# Patient Record
Sex: Male | Born: 1965
Health system: Southern US, Community
[De-identification: ages and names within clinical notes are randomized; demographics above are authoritative.]

## PROBLEM LIST (undated history)

## (undated) DIAGNOSIS — J340 Abscess, furuncle and carbuncle of nose: Secondary | ICD-10-CM

## (undated) DIAGNOSIS — E291 Testicular hypofunction: Secondary | ICD-10-CM

## (undated) DIAGNOSIS — I10 Essential (primary) hypertension: Secondary | ICD-10-CM

## (undated) DIAGNOSIS — K649 Unspecified hemorrhoids: Secondary | ICD-10-CM

## (undated) DIAGNOSIS — F329 Major depressive disorder, single episode, unspecified: Secondary | ICD-10-CM

## (undated) DIAGNOSIS — F32A Depression, unspecified: Secondary | ICD-10-CM

## (undated) DIAGNOSIS — R338 Other retention of urine: Secondary | ICD-10-CM

## (undated) DIAGNOSIS — Z85828 Personal history of other malignant neoplasm of skin: Secondary | ICD-10-CM

## (undated) DIAGNOSIS — G4700 Insomnia, unspecified: Secondary | ICD-10-CM

## (undated) DIAGNOSIS — M1990 Unspecified osteoarthritis, unspecified site: Secondary | ICD-10-CM

## (undated) HISTORY — DX: Unspecified osteoarthritis, unspecified site: M19.90

## (undated) HISTORY — PX: TOTAL HIP ARTHROPLASTY: SHX124

## (undated) HISTORY — DX: Insomnia, unspecified: G47.00

## (undated) HISTORY — DX: Depression, unspecified: F32.A

## (undated) HISTORY — DX: Testicular hypofunction: E29.1

## (undated) HISTORY — PX: COLONOSCOPY: SHX174

## (undated) HISTORY — DX: Other retention of urine: R33.8

## (undated) HISTORY — PX: TOTAL KNEE ARTHROPLASTY: SHX125

## (undated) HISTORY — PX: OSTEOTOMY: SHX137

## (undated) HISTORY — PX: JOINT REPLACEMENT: SHX530

## (undated) HISTORY — DX: Major depressive disorder, single episode, unspecified: F32.9

## (undated) HISTORY — PX: HERNIA REPAIR: SHX51

## (undated) HISTORY — DX: Essential (primary) hypertension: I10

## (undated) HISTORY — DX: Unspecified hemorrhoids: K64.9

## (undated) HISTORY — PX: APPENDECTOMY: SHX54

## (undated) HISTORY — DX: Personal history of other malignant neoplasm of skin: Z85.828

---
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
## 2000-05-21 ENCOUNTER — Ambulatory Visit (HOSPITAL_BASED_OUTPATIENT_CLINIC_OR_DEPARTMENT_OTHER): Admission: RE | Admit: 2000-05-21 | Discharge: 2000-05-21 | Payer: Self-pay | Admitting: Orthopedic Surgery

## 2007-02-16 DIAGNOSIS — L309 Dermatitis, unspecified: Secondary | ICD-10-CM | POA: Insufficient documentation

## 2008-08-10 DIAGNOSIS — G8929 Other chronic pain: Secondary | ICD-10-CM | POA: Insufficient documentation

## 2010-08-25 DIAGNOSIS — Z85828 Personal history of other malignant neoplasm of skin: Secondary | ICD-10-CM

## 2010-08-25 HISTORY — DX: Personal history of other malignant neoplasm of skin: Z85.828

## 2013-07-07 DIAGNOSIS — M169 Osteoarthritis of hip, unspecified: Secondary | ICD-10-CM | POA: Insufficient documentation

## 2014-07-25 DIAGNOSIS — R7989 Other specified abnormal findings of blood chemistry: Secondary | ICD-10-CM | POA: Insufficient documentation

## 2015-11-06 DIAGNOSIS — F0631 Mood disorder due to known physiological condition with depressive features: Secondary | ICD-10-CM | POA: Insufficient documentation

## 2015-11-06 DIAGNOSIS — M532X8 Spinal instabilities, sacral and sacrococcygeal region: Secondary | ICD-10-CM | POA: Insufficient documentation

## 2016-10-01 DIAGNOSIS — M25562 Pain in left knee: Secondary | ICD-10-CM | POA: Diagnosis not present

## 2016-10-09 DIAGNOSIS — Z96652 Presence of left artificial knee joint: Secondary | ICD-10-CM | POA: Diagnosis not present

## 2016-10-09 DIAGNOSIS — T8484XA Pain due to internal orthopedic prosthetic devices, implants and grafts, initial encounter: Secondary | ICD-10-CM | POA: Diagnosis not present

## 2016-10-22 DIAGNOSIS — S46812D Strain of other muscles, fascia and tendons at shoulder and upper arm level, left arm, subsequent encounter: Secondary | ICD-10-CM | POA: Diagnosis not present

## 2016-10-22 DIAGNOSIS — M19019 Primary osteoarthritis, unspecified shoulder: Secondary | ICD-10-CM | POA: Diagnosis not present

## 2016-10-28 DIAGNOSIS — Z0000 Encounter for general adult medical examination without abnormal findings: Secondary | ICD-10-CM | POA: Diagnosis not present

## 2016-10-31 DIAGNOSIS — M461 Sacroiliitis, not elsewhere classified: Secondary | ICD-10-CM | POA: Diagnosis not present

## 2016-11-10 DIAGNOSIS — M19019 Primary osteoarthritis, unspecified shoulder: Secondary | ICD-10-CM | POA: Diagnosis not present

## 2016-11-10 DIAGNOSIS — S46812D Strain of other muscles, fascia and tendons at shoulder and upper arm level, left arm, subsequent encounter: Secondary | ICD-10-CM | POA: Diagnosis not present

## 2016-11-28 DIAGNOSIS — L989 Disorder of the skin and subcutaneous tissue, unspecified: Secondary | ICD-10-CM | POA: Diagnosis not present

## 2016-11-28 DIAGNOSIS — G5762 Lesion of plantar nerve, left lower limb: Secondary | ICD-10-CM | POA: Diagnosis not present

## 2016-11-28 DIAGNOSIS — B078 Other viral warts: Secondary | ICD-10-CM | POA: Diagnosis not present

## 2016-11-28 DIAGNOSIS — M545 Low back pain: Secondary | ICD-10-CM | POA: Diagnosis not present

## 2016-11-28 DIAGNOSIS — B079 Viral wart, unspecified: Secondary | ICD-10-CM | POA: Diagnosis not present

## 2016-11-28 DIAGNOSIS — G8929 Other chronic pain: Secondary | ICD-10-CM | POA: Diagnosis not present

## 2016-12-02 DIAGNOSIS — Z79899 Other long term (current) drug therapy: Secondary | ICD-10-CM | POA: Diagnosis not present

## 2016-12-02 DIAGNOSIS — M461 Sacroiliitis, not elsewhere classified: Secondary | ICD-10-CM | POA: Diagnosis not present

## 2016-12-02 DIAGNOSIS — F0631 Mood disorder due to known physiological condition with depressive features: Secondary | ICD-10-CM | POA: Diagnosis not present

## 2016-12-02 DIAGNOSIS — M79672 Pain in left foot: Secondary | ICD-10-CM | POA: Diagnosis not present

## 2016-12-02 DIAGNOSIS — L910 Hypertrophic scar: Secondary | ICD-10-CM | POA: Diagnosis not present

## 2016-12-03 DIAGNOSIS — Z885 Allergy status to narcotic agent status: Secondary | ICD-10-CM | POA: Diagnosis not present

## 2016-12-03 DIAGNOSIS — M542 Cervicalgia: Secondary | ICD-10-CM | POA: Diagnosis not present

## 2016-12-03 DIAGNOSIS — Z91041 Radiographic dye allergy status: Secondary | ICD-10-CM | POA: Diagnosis not present

## 2016-12-03 DIAGNOSIS — M25551 Pain in right hip: Secondary | ICD-10-CM | POA: Diagnosis not present

## 2016-12-03 DIAGNOSIS — Z85828 Personal history of other malignant neoplasm of skin: Secondary | ICD-10-CM | POA: Diagnosis not present

## 2016-12-03 DIAGNOSIS — M545 Low back pain: Secondary | ICD-10-CM | POA: Diagnosis not present

## 2016-12-03 DIAGNOSIS — M25512 Pain in left shoulder: Secondary | ICD-10-CM | POA: Diagnosis not present

## 2016-12-03 DIAGNOSIS — Z96641 Presence of right artificial hip joint: Secondary | ICD-10-CM | POA: Diagnosis not present

## 2016-12-03 DIAGNOSIS — M1389 Other specified arthritis, multiple sites: Secondary | ICD-10-CM | POA: Diagnosis not present

## 2016-12-03 DIAGNOSIS — Z79899 Other long term (current) drug therapy: Secondary | ICD-10-CM | POA: Diagnosis not present

## 2016-12-03 DIAGNOSIS — M549 Dorsalgia, unspecified: Secondary | ICD-10-CM | POA: Diagnosis not present

## 2016-12-08 DIAGNOSIS — Z96641 Presence of right artificial hip joint: Secondary | ICD-10-CM | POA: Diagnosis not present

## 2016-12-08 DIAGNOSIS — M25551 Pain in right hip: Secondary | ICD-10-CM | POA: Diagnosis not present

## 2016-12-08 DIAGNOSIS — R0789 Other chest pain: Secondary | ICD-10-CM | POA: Diagnosis not present

## 2016-12-08 DIAGNOSIS — M25552 Pain in left hip: Secondary | ICD-10-CM | POA: Diagnosis not present

## 2016-12-08 DIAGNOSIS — Z885 Allergy status to narcotic agent status: Secondary | ICD-10-CM | POA: Diagnosis not present

## 2016-12-08 DIAGNOSIS — S299XXA Unspecified injury of thorax, initial encounter: Secondary | ICD-10-CM | POA: Diagnosis not present

## 2016-12-08 DIAGNOSIS — Z9889 Other specified postprocedural states: Secondary | ICD-10-CM | POA: Diagnosis not present

## 2016-12-08 DIAGNOSIS — Z471 Aftercare following joint replacement surgery: Secondary | ICD-10-CM | POA: Diagnosis not present

## 2016-12-08 DIAGNOSIS — Z96652 Presence of left artificial knee joint: Secondary | ICD-10-CM | POA: Diagnosis not present

## 2016-12-11 DIAGNOSIS — M79672 Pain in left foot: Secondary | ICD-10-CM | POA: Diagnosis not present

## 2016-12-18 DIAGNOSIS — Z96641 Presence of right artificial hip joint: Secondary | ICD-10-CM | POA: Diagnosis not present

## 2016-12-18 DIAGNOSIS — M25551 Pain in right hip: Secondary | ICD-10-CM | POA: Diagnosis not present

## 2016-12-18 DIAGNOSIS — Z96652 Presence of left artificial knee joint: Secondary | ICD-10-CM | POA: Diagnosis not present

## 2016-12-31 DIAGNOSIS — Z96649 Presence of unspecified artificial hip joint: Secondary | ICD-10-CM | POA: Insufficient documentation

## 2016-12-31 DIAGNOSIS — T84038A Mechanical loosening of other internal prosthetic joint, initial encounter: Secondary | ICD-10-CM | POA: Insufficient documentation

## 2017-01-12 DIAGNOSIS — M461 Sacroiliitis, not elsewhere classified: Secondary | ICD-10-CM | POA: Diagnosis not present

## 2017-01-12 DIAGNOSIS — Z885 Allergy status to narcotic agent status: Secondary | ICD-10-CM | POA: Diagnosis not present

## 2017-01-12 DIAGNOSIS — Z471 Aftercare following joint replacement surgery: Secondary | ICD-10-CM | POA: Diagnosis not present

## 2017-01-12 DIAGNOSIS — Z4789 Encounter for other orthopedic aftercare: Secondary | ICD-10-CM | POA: Diagnosis not present

## 2017-01-12 DIAGNOSIS — Z96641 Presence of right artificial hip joint: Secondary | ICD-10-CM | POA: Diagnosis not present

## 2017-01-14 DIAGNOSIS — Z471 Aftercare following joint replacement surgery: Secondary | ICD-10-CM | POA: Diagnosis not present

## 2017-01-14 DIAGNOSIS — T84038D Mechanical loosening of other internal prosthetic joint, subsequent encounter: Secondary | ICD-10-CM | POA: Diagnosis not present

## 2017-01-14 DIAGNOSIS — T84030D Mechanical loosening of internal right hip prosthetic joint, subsequent encounter: Secondary | ICD-10-CM | POA: Diagnosis not present

## 2017-01-14 DIAGNOSIS — Z96641 Presence of right artificial hip joint: Secondary | ICD-10-CM | POA: Diagnosis not present

## 2017-01-14 DIAGNOSIS — Z96649 Presence of unspecified artificial hip joint: Secondary | ICD-10-CM | POA: Diagnosis not present

## 2017-01-20 DIAGNOSIS — S46812A Strain of other muscles, fascia and tendons at shoulder and upper arm level, left arm, initial encounter: Secondary | ICD-10-CM | POA: Diagnosis not present

## 2017-01-20 DIAGNOSIS — M7552 Bursitis of left shoulder: Secondary | ICD-10-CM | POA: Diagnosis not present

## 2017-01-22 DIAGNOSIS — E291 Testicular hypofunction: Secondary | ICD-10-CM | POA: Diagnosis not present

## 2017-01-22 DIAGNOSIS — Z885 Allergy status to narcotic agent status: Secondary | ICD-10-CM | POA: Diagnosis not present

## 2017-01-22 DIAGNOSIS — M461 Sacroiliitis, not elsewhere classified: Secondary | ICD-10-CM | POA: Diagnosis not present

## 2017-01-22 DIAGNOSIS — T84038D Mechanical loosening of other internal prosthetic joint, subsequent encounter: Secondary | ICD-10-CM | POA: Diagnosis not present

## 2017-01-22 DIAGNOSIS — Z96649 Presence of unspecified artificial hip joint: Secondary | ICD-10-CM | POA: Diagnosis not present

## 2017-01-22 DIAGNOSIS — G4700 Insomnia, unspecified: Secondary | ICD-10-CM | POA: Diagnosis not present

## 2017-01-22 DIAGNOSIS — Z79899 Other long term (current) drug therapy: Secondary | ICD-10-CM | POA: Diagnosis not present

## 2017-01-22 DIAGNOSIS — Z91041 Radiographic dye allergy status: Secondary | ICD-10-CM | POA: Diagnosis not present

## 2017-01-22 DIAGNOSIS — Z419 Encounter for procedure for purposes other than remedying health state, unspecified: Secondary | ICD-10-CM | POA: Diagnosis not present

## 2017-01-22 DIAGNOSIS — F5104 Psychophysiologic insomnia: Secondary | ICD-10-CM | POA: Insufficient documentation

## 2017-01-22 DIAGNOSIS — Z01818 Encounter for other preprocedural examination: Secondary | ICD-10-CM | POA: Diagnosis not present

## 2017-01-26 DIAGNOSIS — M549 Dorsalgia, unspecified: Secondary | ICD-10-CM | POA: Diagnosis not present

## 2017-01-26 DIAGNOSIS — G8918 Other acute postprocedural pain: Secondary | ICD-10-CM | POA: Diagnosis not present

## 2017-01-26 DIAGNOSIS — Z91041 Radiographic dye allergy status: Secondary | ICD-10-CM | POA: Diagnosis not present

## 2017-01-26 DIAGNOSIS — Z8781 Personal history of (healed) traumatic fracture: Secondary | ICD-10-CM | POA: Diagnosis not present

## 2017-01-26 DIAGNOSIS — G4700 Insomnia, unspecified: Secondary | ICD-10-CM | POA: Diagnosis not present

## 2017-01-26 DIAGNOSIS — G8929 Other chronic pain: Secondary | ICD-10-CM | POA: Diagnosis not present

## 2017-01-26 DIAGNOSIS — T84030A Mechanical loosening of internal right hip prosthetic joint, initial encounter: Secondary | ICD-10-CM | POA: Diagnosis not present

## 2017-01-26 DIAGNOSIS — Z885 Allergy status to narcotic agent status: Secondary | ICD-10-CM | POA: Diagnosis not present

## 2017-01-26 DIAGNOSIS — T8484XA Pain due to internal orthopedic prosthetic devices, implants and grafts, initial encounter: Secondary | ICD-10-CM | POA: Diagnosis not present

## 2017-01-26 DIAGNOSIS — I959 Hypotension, unspecified: Secondary | ICD-10-CM | POA: Diagnosis not present

## 2017-01-26 DIAGNOSIS — Z471 Aftercare following joint replacement surgery: Secondary | ICD-10-CM | POA: Diagnosis not present

## 2017-01-26 DIAGNOSIS — Z79891 Long term (current) use of opiate analgesic: Secondary | ICD-10-CM | POA: Diagnosis not present

## 2017-01-26 DIAGNOSIS — Z8249 Family history of ischemic heart disease and other diseases of the circulatory system: Secondary | ICD-10-CM | POA: Diagnosis not present

## 2017-01-26 DIAGNOSIS — M1611 Unilateral primary osteoarthritis, right hip: Secondary | ICD-10-CM | POA: Diagnosis not present

## 2017-01-26 DIAGNOSIS — Z96641 Presence of right artificial hip joint: Secondary | ICD-10-CM | POA: Diagnosis not present

## 2017-01-26 DIAGNOSIS — M25551 Pain in right hip: Secondary | ICD-10-CM | POA: Diagnosis not present

## 2017-01-26 DIAGNOSIS — T84038D Mechanical loosening of other internal prosthetic joint, subsequent encounter: Secondary | ICD-10-CM | POA: Diagnosis not present

## 2017-01-26 DIAGNOSIS — T84032A Mechanical loosening of internal right knee prosthetic joint, initial encounter: Secondary | ICD-10-CM | POA: Diagnosis not present

## 2017-01-26 DIAGNOSIS — Z85828 Personal history of other malignant neoplasm of skin: Secondary | ICD-10-CM | POA: Diagnosis not present

## 2017-01-26 DIAGNOSIS — Z7982 Long term (current) use of aspirin: Secondary | ICD-10-CM | POA: Diagnosis not present

## 2017-01-26 DIAGNOSIS — Z823 Family history of stroke: Secondary | ICD-10-CM | POA: Diagnosis not present

## 2017-01-26 DIAGNOSIS — Z96652 Presence of left artificial knee joint: Secondary | ICD-10-CM | POA: Diagnosis not present

## 2017-01-26 DIAGNOSIS — T84038A Mechanical loosening of other internal prosthetic joint, initial encounter: Secondary | ICD-10-CM | POA: Diagnosis not present

## 2017-01-27 DIAGNOSIS — G8929 Other chronic pain: Secondary | ICD-10-CM | POA: Diagnosis not present

## 2017-01-27 DIAGNOSIS — Z96641 Presence of right artificial hip joint: Secondary | ICD-10-CM | POA: Diagnosis not present

## 2017-01-27 DIAGNOSIS — T84038A Mechanical loosening of other internal prosthetic joint, initial encounter: Secondary | ICD-10-CM | POA: Diagnosis not present

## 2017-01-27 DIAGNOSIS — M25551 Pain in right hip: Secondary | ICD-10-CM | POA: Diagnosis not present

## 2017-01-28 DIAGNOSIS — M25551 Pain in right hip: Secondary | ICD-10-CM | POA: Diagnosis not present

## 2017-01-28 DIAGNOSIS — Z96641 Presence of right artificial hip joint: Secondary | ICD-10-CM | POA: Diagnosis not present

## 2017-02-12 DIAGNOSIS — E349 Endocrine disorder, unspecified: Secondary | ICD-10-CM | POA: Diagnosis not present

## 2017-03-12 DIAGNOSIS — Z96641 Presence of right artificial hip joint: Secondary | ICD-10-CM | POA: Diagnosis not present

## 2017-03-12 DIAGNOSIS — Z471 Aftercare following joint replacement surgery: Secondary | ICD-10-CM | POA: Diagnosis not present

## 2017-03-18 DIAGNOSIS — F4321 Adjustment disorder with depressed mood: Secondary | ICD-10-CM | POA: Diagnosis not present

## 2017-03-18 DIAGNOSIS — R413 Other amnesia: Secondary | ICD-10-CM | POA: Diagnosis not present

## 2017-03-30 DIAGNOSIS — M7742 Metatarsalgia, left foot: Secondary | ICD-10-CM | POA: Diagnosis not present

## 2017-03-30 DIAGNOSIS — M79672 Pain in left foot: Secondary | ICD-10-CM | POA: Diagnosis not present

## 2017-03-30 DIAGNOSIS — M2170 Unequal limb length (acquired), unspecified site: Secondary | ICD-10-CM | POA: Diagnosis not present

## 2017-04-02 DIAGNOSIS — M7742 Metatarsalgia, left foot: Secondary | ICD-10-CM | POA: Insufficient documentation

## 2017-04-10 DIAGNOSIS — M461 Sacroiliitis, not elsewhere classified: Secondary | ICD-10-CM | POA: Diagnosis not present

## 2017-04-17 DIAGNOSIS — M461 Sacroiliitis, not elsewhere classified: Secondary | ICD-10-CM | POA: Diagnosis not present

## 2017-04-17 DIAGNOSIS — F0631 Mood disorder due to known physiological condition with depressive features: Secondary | ICD-10-CM | POA: Diagnosis not present

## 2017-04-20 DIAGNOSIS — F0631 Mood disorder due to known physiological condition with depressive features: Secondary | ICD-10-CM | POA: Diagnosis not present

## 2017-04-28 DIAGNOSIS — T50992A Poisoning by other drugs, medicaments and biological substances, intentional self-harm, initial encounter: Secondary | ICD-10-CM | POA: Diagnosis not present

## 2017-04-28 DIAGNOSIS — F419 Anxiety disorder, unspecified: Secondary | ICD-10-CM | POA: Diagnosis not present

## 2017-04-28 DIAGNOSIS — F329 Major depressive disorder, single episode, unspecified: Secondary | ICD-10-CM | POA: Diagnosis not present

## 2017-04-28 DIAGNOSIS — Z96652 Presence of left artificial knee joint: Secondary | ICD-10-CM | POA: Diagnosis not present

## 2017-04-28 DIAGNOSIS — F332 Major depressive disorder, recurrent severe without psychotic features: Secondary | ICD-10-CM | POA: Diagnosis not present

## 2017-04-28 DIAGNOSIS — Y999 Unspecified external cause status: Secondary | ICD-10-CM | POA: Diagnosis not present

## 2017-04-28 DIAGNOSIS — T50901A Poisoning by unspecified drugs, medicaments and biological substances, accidental (unintentional), initial encounter: Secondary | ICD-10-CM | POA: Diagnosis not present

## 2017-04-28 DIAGNOSIS — G8929 Other chronic pain: Secondary | ICD-10-CM | POA: Diagnosis not present

## 2017-04-28 DIAGNOSIS — G2581 Restless legs syndrome: Secondary | ICD-10-CM | POA: Diagnosis not present

## 2017-04-28 DIAGNOSIS — Z79891 Long term (current) use of opiate analgesic: Secondary | ICD-10-CM | POA: Diagnosis not present

## 2017-04-28 DIAGNOSIS — Z85828 Personal history of other malignant neoplasm of skin: Secondary | ICD-10-CM | POA: Diagnosis not present

## 2017-04-28 DIAGNOSIS — T403X2A Poisoning by methadone, intentional self-harm, initial encounter: Secondary | ICD-10-CM | POA: Diagnosis not present

## 2017-04-28 DIAGNOSIS — X58XXXA Exposure to other specified factors, initial encounter: Secondary | ICD-10-CM | POA: Diagnosis not present

## 2017-04-28 DIAGNOSIS — Z915 Personal history of self-harm: Secondary | ICD-10-CM | POA: Diagnosis not present

## 2017-04-28 DIAGNOSIS — F1110 Opioid abuse, uncomplicated: Secondary | ICD-10-CM | POA: Diagnosis not present

## 2017-04-28 DIAGNOSIS — Z96641 Presence of right artificial hip joint: Secondary | ICD-10-CM | POA: Diagnosis not present

## 2017-04-28 DIAGNOSIS — T1491XA Suicide attempt, initial encounter: Secondary | ICD-10-CM | POA: Diagnosis not present

## 2017-04-29 DIAGNOSIS — F1110 Opioid abuse, uncomplicated: Secondary | ICD-10-CM | POA: Diagnosis not present

## 2017-04-29 DIAGNOSIS — F329 Major depressive disorder, single episode, unspecified: Secondary | ICD-10-CM | POA: Diagnosis not present

## 2017-04-29 DIAGNOSIS — T50901A Poisoning by unspecified drugs, medicaments and biological substances, accidental (unintentional), initial encounter: Secondary | ICD-10-CM | POA: Diagnosis not present

## 2017-04-29 DIAGNOSIS — T1491XA Suicide attempt, initial encounter: Secondary | ICD-10-CM | POA: Diagnosis not present

## 2017-04-30 DIAGNOSIS — F332 Major depressive disorder, recurrent severe without psychotic features: Secondary | ICD-10-CM | POA: Diagnosis not present

## 2017-04-30 DIAGNOSIS — T403X2A Poisoning by methadone, intentional self-harm, initial encounter: Secondary | ICD-10-CM | POA: Diagnosis not present

## 2017-05-01 DIAGNOSIS — F332 Major depressive disorder, recurrent severe without psychotic features: Secondary | ICD-10-CM | POA: Diagnosis not present

## 2017-05-02 DIAGNOSIS — F332 Major depressive disorder, recurrent severe without psychotic features: Secondary | ICD-10-CM | POA: Diagnosis not present

## 2017-05-02 DIAGNOSIS — T403X2A Poisoning by methadone, intentional self-harm, initial encounter: Secondary | ICD-10-CM | POA: Diagnosis not present

## 2017-05-03 DIAGNOSIS — T403X2A Poisoning by methadone, intentional self-harm, initial encounter: Secondary | ICD-10-CM | POA: Diagnosis not present

## 2017-05-03 DIAGNOSIS — F332 Major depressive disorder, recurrent severe without psychotic features: Secondary | ICD-10-CM | POA: Diagnosis not present

## 2017-05-05 DIAGNOSIS — F1110 Opioid abuse, uncomplicated: Secondary | ICD-10-CM | POA: Diagnosis not present

## 2017-05-05 DIAGNOSIS — F332 Major depressive disorder, recurrent severe without psychotic features: Secondary | ICD-10-CM | POA: Diagnosis not present

## 2017-06-01 DIAGNOSIS — F332 Major depressive disorder, recurrent severe without psychotic features: Secondary | ICD-10-CM | POA: Diagnosis not present

## 2017-07-09 DIAGNOSIS — Z85828 Personal history of other malignant neoplasm of skin: Secondary | ICD-10-CM | POA: Diagnosis not present

## 2017-07-09 DIAGNOSIS — Z96641 Presence of right artificial hip joint: Secondary | ICD-10-CM | POA: Diagnosis not present

## 2017-07-09 DIAGNOSIS — M1611 Unilateral primary osteoarthritis, right hip: Secondary | ICD-10-CM | POA: Diagnosis not present

## 2017-07-09 DIAGNOSIS — Z471 Aftercare following joint replacement surgery: Secondary | ICD-10-CM | POA: Diagnosis not present

## 2017-07-10 DIAGNOSIS — R197 Diarrhea, unspecified: Secondary | ICD-10-CM | POA: Diagnosis not present

## 2017-07-10 DIAGNOSIS — R194 Change in bowel habit: Secondary | ICD-10-CM | POA: Diagnosis not present

## 2017-07-10 DIAGNOSIS — K640 First degree hemorrhoids: Secondary | ICD-10-CM | POA: Diagnosis not present

## 2017-07-14 DIAGNOSIS — Z23 Encounter for immunization: Secondary | ICD-10-CM | POA: Diagnosis not present

## 2017-07-14 DIAGNOSIS — F5104 Psychophysiologic insomnia: Secondary | ICD-10-CM | POA: Diagnosis not present

## 2017-07-15 DIAGNOSIS — Z1211 Encounter for screening for malignant neoplasm of colon: Secondary | ICD-10-CM | POA: Diagnosis not present

## 2017-07-15 DIAGNOSIS — R197 Diarrhea, unspecified: Secondary | ICD-10-CM | POA: Diagnosis not present

## 2017-07-15 LAB — HM COLONOSCOPY

## 2017-07-17 DIAGNOSIS — G4700 Insomnia, unspecified: Secondary | ICD-10-CM | POA: Diagnosis not present

## 2017-07-17 DIAGNOSIS — M25512 Pain in left shoulder: Secondary | ICD-10-CM | POA: Diagnosis not present

## 2017-07-17 DIAGNOSIS — M75102 Unspecified rotator cuff tear or rupture of left shoulder, not specified as traumatic: Secondary | ICD-10-CM | POA: Diagnosis not present

## 2017-07-21 DIAGNOSIS — F3341 Major depressive disorder, recurrent, in partial remission: Secondary | ICD-10-CM | POA: Insufficient documentation

## 2017-07-29 DIAGNOSIS — E349 Endocrine disorder, unspecified: Secondary | ICD-10-CM | POA: Diagnosis not present

## 2017-07-29 DIAGNOSIS — M461 Sacroiliitis, not elsewhere classified: Secondary | ICD-10-CM | POA: Diagnosis not present

## 2017-07-31 DIAGNOSIS — K640 First degree hemorrhoids: Secondary | ICD-10-CM | POA: Diagnosis not present

## 2017-08-04 DIAGNOSIS — I10 Essential (primary) hypertension: Secondary | ICD-10-CM | POA: Diagnosis not present

## 2017-08-04 DIAGNOSIS — E349 Endocrine disorder, unspecified: Secondary | ICD-10-CM | POA: Diagnosis not present

## 2017-08-12 DIAGNOSIS — Z885 Allergy status to narcotic agent status: Secondary | ICD-10-CM | POA: Diagnosis not present

## 2017-08-12 DIAGNOSIS — R0789 Other chest pain: Secondary | ICD-10-CM | POA: Diagnosis not present

## 2017-08-12 DIAGNOSIS — R079 Chest pain, unspecified: Secondary | ICD-10-CM | POA: Diagnosis not present

## 2017-08-12 DIAGNOSIS — I10 Essential (primary) hypertension: Secondary | ICD-10-CM | POA: Diagnosis not present

## 2017-08-12 DIAGNOSIS — I2510 Atherosclerotic heart disease of native coronary artery without angina pectoris: Secondary | ICD-10-CM | POA: Diagnosis not present

## 2017-08-13 DIAGNOSIS — F3341 Major depressive disorder, recurrent, in partial remission: Secondary | ICD-10-CM | POA: Diagnosis not present

## 2017-08-20 DIAGNOSIS — K640 First degree hemorrhoids: Secondary | ICD-10-CM | POA: Diagnosis not present

## 2017-08-28 DIAGNOSIS — Z136 Encounter for screening for cardiovascular disorders: Secondary | ICD-10-CM | POA: Diagnosis not present

## 2017-08-28 DIAGNOSIS — N401 Enlarged prostate with lower urinary tract symptoms: Secondary | ICD-10-CM | POA: Insufficient documentation

## 2017-08-28 DIAGNOSIS — Z0000 Encounter for general adult medical examination without abnormal findings: Secondary | ICD-10-CM | POA: Diagnosis not present

## 2017-08-28 DIAGNOSIS — R350 Frequency of micturition: Secondary | ICD-10-CM

## 2017-08-28 DIAGNOSIS — G4700 Insomnia, unspecified: Secondary | ICD-10-CM | POA: Diagnosis not present

## 2017-08-28 DIAGNOSIS — Z13220 Encounter for screening for lipoid disorders: Secondary | ICD-10-CM | POA: Diagnosis not present

## 2017-09-07 DIAGNOSIS — K640 First degree hemorrhoids: Secondary | ICD-10-CM | POA: Diagnosis not present

## 2017-09-23 DIAGNOSIS — F3341 Major depressive disorder, recurrent, in partial remission: Secondary | ICD-10-CM | POA: Diagnosis not present

## 2017-10-27 DIAGNOSIS — F332 Major depressive disorder, recurrent severe without psychotic features: Secondary | ICD-10-CM | POA: Diagnosis not present

## 2017-10-27 DIAGNOSIS — F0631 Mood disorder due to known physiological condition with depressive features: Secondary | ICD-10-CM | POA: Diagnosis not present

## 2017-10-27 DIAGNOSIS — F5104 Psychophysiologic insomnia: Secondary | ICD-10-CM | POA: Diagnosis not present

## 2017-10-29 DIAGNOSIS — M75102 Unspecified rotator cuff tear or rupture of left shoulder, not specified as traumatic: Secondary | ICD-10-CM | POA: Diagnosis not present

## 2017-10-29 DIAGNOSIS — M25512 Pain in left shoulder: Secondary | ICD-10-CM | POA: Diagnosis not present

## 2017-11-05 DIAGNOSIS — K640 First degree hemorrhoids: Secondary | ICD-10-CM | POA: Diagnosis not present

## 2017-11-05 DIAGNOSIS — K580 Irritable bowel syndrome with diarrhea: Secondary | ICD-10-CM | POA: Diagnosis not present

## 2017-11-05 DIAGNOSIS — I10 Essential (primary) hypertension: Secondary | ICD-10-CM | POA: Diagnosis not present

## 2017-11-30 DIAGNOSIS — M25512 Pain in left shoulder: Secondary | ICD-10-CM | POA: Diagnosis not present

## 2017-12-02 DIAGNOSIS — M533 Sacrococcygeal disorders, not elsewhere classified: Secondary | ICD-10-CM | POA: Diagnosis not present

## 2017-12-07 DIAGNOSIS — F3341 Major depressive disorder, recurrent, in partial remission: Secondary | ICD-10-CM | POA: Diagnosis not present

## 2017-12-17 DIAGNOSIS — M75122 Complete rotator cuff tear or rupture of left shoulder, not specified as traumatic: Secondary | ICD-10-CM | POA: Diagnosis not present

## 2017-12-17 DIAGNOSIS — M7542 Impingement syndrome of left shoulder: Secondary | ICD-10-CM | POA: Diagnosis not present

## 2017-12-17 DIAGNOSIS — M1612 Unilateral primary osteoarthritis, left hip: Secondary | ICD-10-CM | POA: Diagnosis not present

## 2017-12-17 DIAGNOSIS — E669 Obesity, unspecified: Secondary | ICD-10-CM | POA: Diagnosis not present

## 2017-12-17 DIAGNOSIS — Z6828 Body mass index (BMI) 28.0-28.9, adult: Secondary | ICD-10-CM | POA: Diagnosis not present

## 2017-12-17 DIAGNOSIS — G8918 Other acute postprocedural pain: Secondary | ICD-10-CM | POA: Diagnosis not present

## 2017-12-17 DIAGNOSIS — L309 Dermatitis, unspecified: Secondary | ICD-10-CM | POA: Diagnosis not present

## 2018-01-06 DIAGNOSIS — M545 Low back pain, unspecified: Secondary | ICD-10-CM | POA: Insufficient documentation

## 2018-01-06 DIAGNOSIS — G8929 Other chronic pain: Secondary | ICD-10-CM | POA: Insufficient documentation

## 2018-01-15 DIAGNOSIS — M545 Low back pain: Secondary | ICD-10-CM | POA: Diagnosis not present

## 2018-01-15 DIAGNOSIS — G8929 Other chronic pain: Secondary | ICD-10-CM | POA: Diagnosis not present

## 2018-01-25 DIAGNOSIS — M533 Sacrococcygeal disorders, not elsewhere classified: Secondary | ICD-10-CM | POA: Diagnosis not present

## 2018-01-30 DIAGNOSIS — M533 Sacrococcygeal disorders, not elsewhere classified: Secondary | ICD-10-CM | POA: Diagnosis not present

## 2018-01-30 DIAGNOSIS — Z96641 Presence of right artificial hip joint: Secondary | ICD-10-CM | POA: Diagnosis not present

## 2018-02-05 DIAGNOSIS — E349 Endocrine disorder, unspecified: Secondary | ICD-10-CM | POA: Diagnosis not present

## 2018-02-11 DIAGNOSIS — Z96652 Presence of left artificial knee joint: Secondary | ICD-10-CM | POA: Diagnosis not present

## 2018-02-11 DIAGNOSIS — Z09 Encounter for follow-up examination after completed treatment for conditions other than malignant neoplasm: Secondary | ICD-10-CM | POA: Diagnosis not present

## 2018-02-11 DIAGNOSIS — M25562 Pain in left knee: Secondary | ICD-10-CM | POA: Diagnosis not present

## 2018-02-15 ENCOUNTER — Other Ambulatory Visit (HOSPITAL_COMMUNITY): Payer: Self-pay | Admitting: Orthopedic Surgery

## 2018-02-15 DIAGNOSIS — M25562 Pain in left knee: Secondary | ICD-10-CM

## 2018-02-24 ENCOUNTER — Encounter: Payer: Self-pay | Admitting: Sports Medicine

## 2018-02-24 ENCOUNTER — Ambulatory Visit (INDEPENDENT_AMBULATORY_CARE_PROVIDER_SITE_OTHER): Payer: BLUE CROSS/BLUE SHIELD | Admitting: Sports Medicine

## 2018-02-24 ENCOUNTER — Encounter: Payer: Self-pay | Admitting: Physician Assistant

## 2018-02-24 ENCOUNTER — Ambulatory Visit (INDEPENDENT_AMBULATORY_CARE_PROVIDER_SITE_OTHER): Payer: BLUE CROSS/BLUE SHIELD | Admitting: Physician Assistant

## 2018-02-24 VITALS — BP 145/83 | HR 72 | Resp 14 | Ht 66.0 in | Wt 185.0 lb

## 2018-02-24 DIAGNOSIS — I10 Essential (primary) hypertension: Secondary | ICD-10-CM

## 2018-02-24 DIAGNOSIS — G43009 Migraine without aura, not intractable, without status migrainosus: Secondary | ICD-10-CM

## 2018-02-24 DIAGNOSIS — M7742 Metatarsalgia, left foot: Secondary | ICD-10-CM | POA: Diagnosis not present

## 2018-02-24 DIAGNOSIS — R601 Generalized edema: Secondary | ICD-10-CM

## 2018-02-24 DIAGNOSIS — N529 Male erectile dysfunction, unspecified: Secondary | ICD-10-CM | POA: Diagnosis not present

## 2018-02-24 DIAGNOSIS — R740 Nonspecific elevation of levels of transaminase and lactic acid dehydrogenase [LDH]: Secondary | ICD-10-CM

## 2018-02-24 DIAGNOSIS — Z7689 Persons encountering health services in other specified circumstances: Secondary | ICD-10-CM

## 2018-02-24 DIAGNOSIS — R7401 Elevation of levels of liver transaminase levels: Secondary | ICD-10-CM

## 2018-02-24 MED ORDER — SILDENAFIL CITRATE 20 MG PO TABS: ORAL_TABLET | ORAL | 5 refills | Status: DC

## 2018-02-24 MED ORDER — SUMATRIPTAN SUCCINATE 50 MG PO TABS: 50.0000 mg | ORAL_TABLET | Freq: Once | ORAL | 1 refills | Status: DC | PRN

## 2018-02-24 NOTE — Patient Instructions (Addendum)
For your blood pressure: - Goal <130/80 - baby aspirin 81 mg daily to help prevent heart attack/stroke - monitor and log blood pressures at home - check around the same time each day in a relaxed setting - Limit salt to <2000 mg/day - Follow DASH eating plan - limit alcohol to 2 standard drinks per day for men and 1 per day for women - avoid tobacco products - weight loss: 7% of current body weight - follow-up every 6 months for your blood pressure   For swelling: - consider sleep study to rule out sleep apnea, a common cause of edema - avoid NSAIDs such as Ibuprofen, Advil, Aleve etc. - limit sodium to less than 2000 mg daily (ideally <1500 mg) - consider reducing your dose of testosterone - wear compression socks daily - elevate legs above the level of the heart when at rest  For migraines - headache diary (paper or app like MigraineBuddy, iHeadache) - optional B2 (riboflavin) and Magnesium for prophylaxis - take Imitrex at the first sign of migraine for abortive therapy - may repeat once after 2 hours if headache persists - do not use more than twice in 24 hours. And try not to use abortive medication more than 6 times per month - limit use of OTC pain relievers like Tylenol and Motrin, which can cause rebound headaches if used more than 6 days per month - work on sleep hygiene - drink 11 cups of water per day - reduce caffeine intake - avoid alcohol - don't skip meals

## 2018-02-24 NOTE — Assessment & Plan Note (Addendum)
Extensive orthopedic history, several osteotomies on the foot, Morton's neuroma excision between the third and the fourth metatarsal heads. Today I think he has more of a intermetatarsal bursitis, 3/4. Intermetatarsal bursa injection today. In the past he had custom orthotics made to correct a leg length discrepancy but after a revision arthroplasty his leg length discrepancy was corrected, he no longer has a set of orthotics that would work, so he will return for custom molded orthotics with metatarsal pad. I did explain some of the limitations and set expectations with regards to his pain. He has tried multiple neuropathic agents including Cymbalta, gabapentin, amitriptyline, no improvement, Celebrex, narcotics, meloxicam, ibuprofen also not effective and he would like to try to stay away from pharmacologic intervention. Return to see me for custom orthotics. We are also getting an MRI of the foot with contrast. In addition considering the fluid visible in the intermetatarsal bursa, if this recurs we will do an arthrocentesis with crystal analysis.

## 2018-02-24 NOTE — Progress Notes (Signed)
HPI:                                                                Earl [name] is a 52 y.o. male who presents to [facility]: Primary Care Sports Medicine today to establish care  Current concerns: headaches, swelling ED  HTN: currently managed with lifestyle. Does not check BP's at home. Denies vision change, , chest pain with exertion, orthopnea, lightheadedness, syncope. Risk factors include: male sex, family history  Headaches: reports monthly right-sided headache that starts in the occipital region and radiates over the top of his head to his right orbital area. He has had similar headaches for over 20 years. Headaches are moderate-severe, lasting for several hours to as long as 1 day, worse with activity. Denies photophobia, diplopia, vision loss,  nausea, dizziness. Has tried OTC analgesics without much relief.  Swelling: reports he struggles with water retention "all over" and will gain 5 pounds in a day. This has been a chronic problem for years. He has tried limiting sodium. Denies dyspnea, orthopnea, PND.  BPH: reports he was diagnosed with an enlarged prostate by his PCP and prescribed Flomax at one time. Could not tolerate the Flomax Endorses nocturia 1-2 times per night and slightly decreased urinary stream. Denies post-void dribbling, hesitancy, incomplete bladder emptying, frequency, urgency, straining, hematuria.  ED: has noticed a change in sexual function beginning approximately 1 year ago after a surgery SHIM    Row Name 02/24/18 0908         SHIM: Over the last 6 months:   How do you rate your confidence that you could get and keep an erection?  Low     When you had erections with sexual stimulation, how often were your erections hard enough for penetration (entering your partner)?  Sometimes (about half the time)     During sexual intercourse, how often were you able to maintain your erection after you had penetrated (entered) your partner?   Most Times (much more than half the time)     During sexual intercourse, how difficult was it to maintain your erection to completion of intercourse?  Slightly Difficult     When you attempted sexual intercourse, how often was it satisfactory for you?  A Few Times (much less than half the time)       SHIM Total Score   SHIM  15         Depression screen [facility] 2/9 02/24/2018  Decreased Interest 0  Down, Depressed, Hopeless 0  PHQ - 2 Score 0  Altered sleeping 3  Tired, decreased energy 0  Change in appetite 0  Feeling bad or failure about yourself  0  Trouble concentrating 0  Moving slowly or fidgety/restless 0  Suicidal thoughts 0  PHQ-9 Score 3    No flowsheet data found.    Past Medical History:  Diagnosis Date  . Arthritis   . Depression   . Hemorrhoids   . History of skin cancer in adulthood 2012   chest wall  . Hypertension   . Hypogonadism, male   . Insomnia    Past Surgical History:  Procedure Laterality Date  . APPENDECTOMY    . COLONOSCOPY    . HERNIA REPAIR    .  JOINT REPLACEMENT    . OSTEOTOMY    . SHOULDER ARTHROSCOPY Left   . TOTAL HIP ARTHROPLASTY Right   . TOTAL KNEE ARTHROPLASTY Left    Social History   Tobacco Use  . Smoking status: Not on file  Substance Use Topics  . Alcohol use: Not on file   family history includes Aneurysm in his brother; Heart disease in his sister; Hypertension in his sister; Skin cancer in his father; Stroke in his mother.    ROS: negative except as noted in the HPI  Medications: Current Outpatient Medications  Medication Sig Dispense Refill  . Eszopiclone (ESZOPICLONE) 3 MG TABS Take by mouth.    . testosterone cypionate (DEPOTESTOSTERONE CYPIONATE) 200 MG/ML injection Inject into the muscle.     No current facility-administered medications for this visit.    Allergies  Allergen Reactions  . Iodinated Diagnostic Agents Swelling    N/V - rash- red skin   . Morphine And Related Other (See Comments)     Irritability        Objective:  BP (!) 145/83   Pulse 72   Resp 14   Ht 5\' 6"  (1.676 m)   Wt 185 lb (83.9 kg)   BMI 29.86 kg/m  Gen:  alert, not ill-appearing, no distress, appropriate for age HEENT: head normocephalic without obvious abnormality, conjunctiva and cornea clear, trachea midline Pulm: Normal work of breathing, normal phonation, clear to auscultation bilaterally, no wheezes, rales or rhonchi CV: Normal rate, regular rhythm, s1 and s2 distinct, no murmurs, clicks or rubs  Neuro: alert and oriented x 3, no tremor MSK: extremities atraumatic, normal gait and station, no peripheral edema Skin: intact, no rashes on exposed skin, no jaundice, no cyanosis Psych: well-groomed, cooperative, good eye contact, euthymic mood, affect mood-congruent, speech is articulate, and thought processes clear and goal-directed    No results found for this or any previous visit (from the past 72 hour(s)). No results found.    Assessment and Plan: 52 y.o. male with   Encounter to establish care  Hypertension goal BP (blood pressure) < 130/80 - Plan: Urine Microalbumin w/creat. ratio  Generalized edema - Plan: Urinalysis, microscopic only, TSH + free T4, COMPLETE METABOLIC PANEL WITH GFR, B Nat Peptide, Urine Microalbumin w/creat. ratio  Erectile dysfunction, unspecified erectile dysfunction type - Plan: sildenafil (REVATIO) 20 MG tablet  Migraine without aura and without status migrainosus, not intractable - Plan: SUMAtriptan (IMITREX) 50 MG tablet  - Personally reviewed PMH, PSH, PFH, medications, allergies, HM - Age-appropriate cancer screening: Colonoscopy UTD, requesting records from Cephus Shelling, MD - Influenza n/a - Tdap UTD - PHQ2 negative  Generalized Edema - checking CMP r/o renal and liver disease, UA, BNP. No evidence of volume overload on exam - potential causes include testosterone, NSAIDs, and dependent edema due to significant arthritis/orthopedic  issues - recommend limiting sodium to <2000 mg/day, compression socks, elevate   Migraine w/o aura - chronic unilateral headaches with migrainous features - counseled on lifestyle and general measures - headache diary - Imitrex 50 mg prn for abortive therapy  Hypertension BP Readings from Last 3 Encounters:  02/24/18 (!) 145/83  02/24/18 (!) 145/83  - currently managed with diet and lifestyle - CVD risk factors include: male sex, family hx - personally reviewed lipid panel 08/28/17, LDL 75, TC 136 - declines antihypertensive medication at this time - recommend baby asa for primary prevention - counseled on therapeutic lifestyle changes  Erectile dysfunction - SHIM score 15 - no reversible causes.  Likely vasculogenic due to undertreated HTN - Sildenafil prn. Counseled on adverse effects, including headache  Patient education and anticipatory guidance given Patient agrees with treatment plan Follow-up in 3 months for HTN/migraines or sooner as needed if symptoms worsen or fail to improve  Levonne Hubertharley E. Cummings PA-C
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
## 2018-02-25 LAB — COMPLETE METABOLIC PANEL WITH GFR
AG Ratio: 1.9 (calc) (ref 1.0–2.5)
ALT: 41 U/L (ref 9–46)
AST: 51 U/L — ABNORMAL HIGH (ref 10–35)
Albumin: 4.7 g/dL (ref 3.6–5.1)
Alkaline phosphatase (APISO): 98 U/L (ref 40–115)
BUN/Creatinine Ratio: 26 (calc) — ABNORMAL HIGH (ref 6–22)
BUN: 26 mg/dL — ABNORMAL HIGH (ref 7–25)
CALCIUM: 9.5 mg/dL (ref 8.6–10.3)
CO2: 31 mmol/L (ref 20–32)
CREATININE: 0.99 mg/dL (ref 0.70–1.33)
Chloride: 102 mmol/L (ref 98–110)
GFR, EST NON AFRICAN AMERICAN: 87 mL/min/{1.73_m2} (ref >=60)
GFR, Est African American: 101 mL/min/{1.73_m2} (ref >=60)
Globulin: 2.5 g/dL (calc) (ref 1.9–3.7)
Glucose, Bld: 93 mg/dL (ref 65–139)
Potassium: 4.6 mmol/L (ref 3.5–5.3)
Sodium: 140 mmol/L (ref 135–146)
Total Bilirubin: 0.4 mg/dL (ref 0.2–1.2)
Total Protein: 7.2 g/dL (ref 6.1–8.1)

## 2018-02-25 LAB — URINALYSIS, MICROSCOPIC ONLY
Bacteria, UA: NONE SEEN /HPF
Hyaline Cast: NONE SEEN /LPF
RBC / HPF: NONE SEEN /HPF (ref 0–2)
Squamous Epithelial / LPF: NONE SEEN /HPF (ref <=5)
WBC, UA: NONE SEEN /HPF (ref 0–5)

## 2018-02-25 LAB — TSH+FREE T4: TSH W/REFLEX TO FT4: 1.59 m[IU]/L (ref 0.40–4.50)

## 2018-02-25 LAB — BRAIN NATRIURETIC PEPTIDE: BRAIN NATRIURETIC PEPTIDE: 4 pg/mL (ref <100)

## 2018-02-25 LAB — MICROALBUMIN / CREATININE URINE RATIO
CREATININE, URINE: 115 mg/dL (ref 20–320)
MICROALB/CREAT RATIO: 12 ug/mg{creat} (ref <30)
Microalb, Ur: 1.4 mg/dL

## 2018-02-26 ENCOUNTER — Encounter: Payer: Self-pay | Admitting: Sports Medicine

## 2018-02-26 ENCOUNTER — Ambulatory Visit (INDEPENDENT_AMBULATORY_CARE_PROVIDER_SITE_OTHER): Payer: BLUE CROSS/BLUE SHIELD | Admitting: Sports Medicine

## 2018-02-26 ENCOUNTER — Encounter: Payer: Self-pay | Admitting: Physician Assistant

## 2018-02-26 DIAGNOSIS — M7742 Metatarsalgia, left foot: Secondary | ICD-10-CM | POA: Diagnosis not present

## 2018-02-26 DIAGNOSIS — R740 Nonspecific elevation of levels of transaminase and lactic acid dehydrogenase [LDH]: Secondary | ICD-10-CM

## 2018-02-26 DIAGNOSIS — R601 Generalized edema: Secondary | ICD-10-CM | POA: Insufficient documentation

## 2018-02-26 DIAGNOSIS — N529 Male erectile dysfunction, unspecified: Secondary | ICD-10-CM | POA: Insufficient documentation

## 2018-02-26 DIAGNOSIS — G43009 Migraine without aura, not intractable, without status migrainosus: Secondary | ICD-10-CM | POA: Insufficient documentation

## 2018-02-26 DIAGNOSIS — I10 Essential (primary) hypertension: Secondary | ICD-10-CM | POA: Insufficient documentation

## 2018-02-26 DIAGNOSIS — R7401 Elevation of levels of liver transaminase levels: Secondary | ICD-10-CM | POA: Insufficient documentation

## 2018-02-26 NOTE — Progress Notes (Signed)
Good morning Earl [name],  Your labs show a slight increase in one of your liver enzymes (AST). Recommend we repeat your liver function tests in 1 month.   Your labs also show mild dehydration. Recommend drinking at least 1.5 liters of water per day. Limit caffeine.  There is nothing to suggest an underlying cause of your swelling. This is likely a combination of dependent edema from your arthritis/joint issues and sensitivity to fluid shifts when you eat salt. Certain medications, such as anti-inflammatories and testosterone can also exacerbate swelling. I would recommend - consider sleep study to rule out sleep apnea, a common cause of edema - avoid NSAIDs such as Ibuprofen, Advil, Aleve etc. - limit sodium to less than 2000 mg daily (ideally <1500 mg) - consider reducing your dose of testosterone - wear compression socks daily - elevate legs above the level of the heart when at rest  Best, Vinetta Bergamoharley

## 2018-02-26 NOTE — Progress Notes (Signed)
    Patient was fitted for a : standard, cushioned, semi-rigid orthotic. The orthotic was heated and afterward the patient stood on the orthotic blank positioned on the orthotic stand. The patient was positioned in subtalar neutral position and 10 degrees of ankle dorsiflexion in a weight bearing stance. After completion of molding, a stable base was applied to the orthotic blank. The blank was ground to a stable position for weight bearing. Size: 11 Base: [facility] Additional Posting and Padding: Left-sided medium metatarsal pad, 3/4 The patient ambulated these, and they were very comfortable.  I spent 40 minutes with this patient, greater than 50% was face-to-face time counseling regarding the below diagnosis.  ___________________________________________ Ihor Austinhomas J. Benjamin Stainhekkekandam, M.D., ABFM., CAQSM. Primary Care and Sports Medicine [facility]  Adjunct Instructor of Family Medicine  [employer]

## 2018-02-26 NOTE — Addendum Note (Signed)
Addended by: Gena FrayUMMINGS, Brianah Hopson E on: 02/26/2018 10:52 AM   Modules accepted: Orders

## 2018-02-26 NOTE — Assessment & Plan Note (Signed)
Extensive orthopedic history, several osteotomies on the foot, Morton's neuroma excision between the third and the fourth metatarsal heads. Today I think he has more of a intermetatarsal bursitis, 3/4. Intermetatarsal bursa injection today. In the past he had custom orthotics made to correct a leg length discrepancy but after a revision arthroplasty his leg length discrepancy was corrected, he no longer has a set of orthotics that would work, so he will return for custom molded orthotics with metatarsal pad. I did explain some of the limitations and set expectations with regards to his pain. He has tried multiple neuropathic agents including Cymbalta, gabapentin, amitriptyline, no improvement, Celebrex, narcotics, meloxicam, ibuprofen also not effective and he would like to try to stay away from pharmacologic intervention. Return to see me for custom orthotics. We are also getting an MRI of the foot with contrast. In addition considering the fluid visible in the intermetatarsal bursa, if this recurs we will do an arthrocentesis with crystal analysis.  Improved considerably after 3/4 intermetatarsal bursa injection, we made custom orthotics today with a metatarsal pad. Return for MRI results

## 2018-03-01 ENCOUNTER — Encounter (HOSPITAL_COMMUNITY)
Admission: RE | Admit: 2018-03-01 | Discharge: 2018-03-01 | Disposition: A | Discharge status: Home / Self Care | Payer: BLUE CROSS/BLUE SHIELD | Source: Ambulatory Visit | Attending: Orthopedic Surgery | Admitting: Orthopedic Surgery

## 2018-03-01 DIAGNOSIS — M25562 Pain in left knee: Secondary | ICD-10-CM | POA: Diagnosis not present

## 2018-03-01 MED ORDER — TECHNETIUM TC 99M MEDRONATE IV KIT
21.3000 | PACK | Freq: Once | INTRAVENOUS | Status: AC | PRN
  Administered 2018-03-01: 21.3 via INTRAVENOUS

## 2018-03-03 ENCOUNTER — Encounter: Payer: Self-pay | Admitting: Physician Assistant

## 2018-03-03 ENCOUNTER — Ambulatory Visit (INDEPENDENT_AMBULATORY_CARE_PROVIDER_SITE_OTHER): Payer: BLUE CROSS/BLUE SHIELD | Admitting: Sports Medicine

## 2018-03-03 ENCOUNTER — Encounter: Payer: Self-pay | Admitting: Sports Medicine

## 2018-03-03 DIAGNOSIS — M25562 Pain in left knee: Secondary | ICD-10-CM

## 2018-03-03 DIAGNOSIS — Z96652 Presence of left artificial knee joint: Secondary | ICD-10-CM | POA: Diagnosis not present

## 2018-03-03 NOTE — Assessment & Plan Note (Signed)
Arthroplasty in 2004, revision in 2010. Bone scan with some increased uptake more consistent with synovitis than aseptic loosening. Injection as above, return in a month.

## 2018-03-03 NOTE — Progress Notes (Signed)
Subjective:    CC: Follow-up  HPI: Intermetatarsal bursitis: Improved considerably after injection, and with custom orthotics with a metatarsal pad, he does need the metatarsal pad moved back a bit.  Left total knee arthroplasty: Done in 2004 with revision in 2010, having some pain, bone scan was done after an x-ray showed possible loosening of the tibial component.  The bone scan showed some uptake more consistent with synovitis, and chronic uptake in the tibial component not changed from prior.  I reviewed the past medical history, family history, social history, surgical history, and allergies today and no changes were needed.  Please see the problem list section below in epic for further details.  Past Medical History: Past Medical History:  Diagnosis Date  . Arthritis   . Depression   . Hemorrhoids   . History of skin cancer in adulthood 2012   chest wall  . Hypertension   . Hypogonadism, male   . Insomnia    Past Surgical History: Past Surgical History:  Procedure Laterality Date  . APPENDECTOMY    . COLONOSCOPY    . HERNIA REPAIR    . JOINT REPLACEMENT    . OSTEOTOMY    . SHOULDER ARTHROSCOPY Left   . TOTAL HIP ARTHROPLASTY Right   . TOTAL KNEE ARTHROPLASTY Left    Social History: Social History   Socioeconomic History  . Marital status: Divorced    Spouse name: Not on file  . Number of children: Not on file  . Years of education: Not on file  . Highest education level: Not on file  Occupational History  . Not on file  Social Needs  . Financial resource strain: Not on file  . Food insecurity:    Worry: Not on file    Inability: Not on file  . Transportation needs:    Medical: Not on file    Non-medical: Not on file  Tobacco Use  . Smoking status: Never Smoker  . Smokeless tobacco: Never Used  Substance and Sexual Activity  . Alcohol use: Yes  . Drug use: Never  . Sexual activity: Yes  Lifestyle  . Physical activity:    Days per week: Not on file     Minutes per session: Not on file  . Stress: Not on file  Relationships  . Social connections:    Talks on phone: Not on file    Gets together: Not on file    Attends religious service: Not on file    Active member of club or organization: Not on file    Attends meetings of clubs or organizations: Not on file    Relationship status: Not on file  Other Topics Concern  . Not on file  Social History Narrative  . Not on file   Family History: Family History  Problem Relation Age of Onset  . Stroke Mother   . Skin cancer Father   . Aneurysm Brother   . Hypertension Sister   . Heart disease Sister   . Cancer Neg Hx   . Heart attack Neg Hx    Allergies: Allergies  Allergen Reactions  . Iodinated Diagnostic Agents Swelling    N/V - rash- red skin   . Morphine And Related Other (See Comments)    Irritability    Medications: See med rec.  Review of Systems: No fevers, chills, night sweats, weight loss, chest pain, or shortness of breath.   Objective:    General: Well Developed, well nourished, and in no acute distress.  Neuro: Alert and oriented x3, extra-ocular muscles intact, sensation grossly intact.  HEENT: Normocephalic, atraumatic, pupils equal round reactive to light, neck supple, no masses, no lymphadenopathy, thyroid nonpalpable.  Skin: Warm and dry, no rashes. Cardiac: Regular rate and rhythm, no murmurs rubs or gallops, no lower extremity edema.  Respiratory: Clear to auscultation bilaterally. Not using accessory muscles, speaking in full sentences.  Procedure: Real-time Ultrasound Guided Injection of left knee Device: GE Logiq E  Verbal informed consent obtained.  Time-out conducted.  Noted no overlying erythema, induration, or other signs of local infection.  Skin prepped in a sterile fashion.  Local anesthesia: Topical Ethyl chloride.  With sterile technique and under real time ultrasound guidance: 1 cc Kenalog 40, 2 cc lidocaine, 2 cc bupivacaine  injected easily Completed without difficulty  Pain immediately resolved suggesting accurate placement of the medication.  Advised to call if fevers/chills, erythema, induration, drainage, or persistent bleeding.  Images permanently stored and available for review in the ultrasound unit.  Impression: Technically successful ultrasound guided injection.  Impression and Recommendations:    History of arthroplasty of left knee Arthroplasty in 2004, revision in 2010. Bone scan with some increased uptake more consistent with synovitis than aseptic loosening. Injection as above, return in a month. ___________________________________________ Ihor Austinhomas J. Benjamin Stainhekkekandam, M.D., ABFM., CAQSM. Primary Care and Sports Medicine [facility]  Adjunct Instructor of Family Medicine  [employer]

## 2018-03-15 ENCOUNTER — Ambulatory Visit (INDEPENDENT_AMBULATORY_CARE_PROVIDER_SITE_OTHER): Payer: BLUE CROSS/BLUE SHIELD

## 2018-03-15 DIAGNOSIS — M19072 Primary osteoarthritis, left ankle and foot: Secondary | ICD-10-CM | POA: Diagnosis not present

## 2018-03-15 DIAGNOSIS — F330 Major depressive disorder, recurrent, mild: Secondary | ICD-10-CM | POA: Diagnosis not present

## 2018-03-15 DIAGNOSIS — R600 Localized edema: Secondary | ICD-10-CM | POA: Diagnosis not present

## 2018-03-15 DIAGNOSIS — G8921 Chronic pain due to trauma: Secondary | ICD-10-CM | POA: Diagnosis not present

## 2018-03-15 MED ORDER — GADOBENATE DIMEGLUMINE 529 MG/ML IV SOLN
15.0000 mL | Freq: Once | INTRAVENOUS | Status: AC | PRN
  Administered 2018-03-15: 15 mL via INTRAVENOUS

## 2018-03-16 DIAGNOSIS — M461 Sacroiliitis, not elsewhere classified: Secondary | ICD-10-CM | POA: Diagnosis not present

## 2018-03-19 ENCOUNTER — Ambulatory Visit: Payer: BLUE CROSS/BLUE SHIELD | Admitting: Sports Medicine

## 2018-03-30 ENCOUNTER — Encounter: Payer: Self-pay | Admitting: Sports Medicine

## 2018-03-30 ENCOUNTER — Ambulatory Visit (INDEPENDENT_AMBULATORY_CARE_PROVIDER_SITE_OTHER): Payer: BLUE CROSS/BLUE SHIELD | Admitting: Sports Medicine

## 2018-03-30 DIAGNOSIS — M532X8 Spinal instabilities, sacral and sacrococcygeal region: Secondary | ICD-10-CM | POA: Diagnosis not present

## 2018-03-30 DIAGNOSIS — M7742 Metatarsalgia, left foot: Secondary | ICD-10-CM

## 2018-03-30 NOTE — Progress Notes (Signed)
Subjective:    CC: Follow-up  HPI: This is a pleasant 52 year old male with a complex orthopedic history.  We did an intermetatarsal bursa injection a couple visits ago and he is completely pain-free with regards to his foot.  He did recently have a right SI joint fusion 2 weeks ago, still has some expected postoperative pain.  Does have a follow-up with his surgeon later this week.  I reviewed the past medical history, family history, social history, surgical history, and allergies today and no changes were needed.  Please see the problem list section below in epic for further details.  Past Medical History: Past Medical History:  Diagnosis Date  . Arthritis   . Depression   . Hemorrhoids   . History of skin cancer in adulthood 2012   chest wall  . Hypertension   . Hypogonadism, male   . Insomnia    Past Surgical History: Past Surgical History:  Procedure Laterality Date  . APPENDECTOMY    . COLONOSCOPY    . HERNIA REPAIR    . JOINT REPLACEMENT    . OSTEOTOMY    . SHOULDER ARTHROSCOPY Left   . TOTAL HIP ARTHROPLASTY Right   . TOTAL KNEE ARTHROPLASTY Left    Social History: Social History   Socioeconomic History  . Marital status: Divorced    Spouse name: Not on file  . Number of children: Not on file  . Years of education: Not on file  . Highest education level: Not on file  Occupational History  . Not on file  Social Needs  . Financial resource strain: Not on file  . Food insecurity:    Worry: Not on file    Inability: Not on file  . Transportation needs:    Medical: Not on file    Non-medical: Not on file  Tobacco Use  . Smoking status: Never Smoker  . Smokeless tobacco: Never Used  Substance and Sexual Activity  . Alcohol use: Yes  . Drug use: Never  . Sexual activity: Yes  Lifestyle  . Physical activity:    Days per week: Not on file    Minutes per session: Not on file  . Stress: Not on file  Relationships  . Social connections:    Talks on  phone: Not on file    Gets together: Not on file    Attends religious service: Not on file    Active member of club or organization: Not on file    Attends meetings of clubs or organizations: Not on file    Relationship status: Not on file  Other Topics Concern  . Not on file  Social History Narrative  . Not on file   Family History: Family History  Problem Relation Age of Onset  . Stroke Mother   . Skin cancer Father   . Aneurysm Brother   . Hypertension Sister   . Heart disease Sister   . Cancer Neg Hx   . Heart attack Neg Hx    Allergies: Allergies  Allergen Reactions  . Iodinated Diagnostic Agents Swelling    N/V - rash- red skin   . Morphine And Related Other (See Comments)    Irritability    Medications: See med rec.  Review of Systems: No fevers, chills, night sweats, weight loss, chest pain, or shortness of breath.   Objective:    General: Well Developed, well nourished, and in no acute distress.  Neuro: Alert and oriented x3, extra-ocular muscles intact, sensation grossly intact.  HEENT: Normocephalic, atraumatic, pupils equal round reactive to light, neck supple, no masses, no lymphadenopathy, thyroid nonpalpable.  Skin: Warm and dry, no rashes. Cardiac: Regular rate and rhythm, no murmurs rubs or gallops, no lower extremity edema.  Respiratory: Clear to auscultation bilaterally. Not using accessory muscles, speaking in full sentences. Right buttock: Incision is clean, dry, intact, mild bruising.  No signs of bacterial infection or dehiscence.  Impression and Recommendations:    Post right SI joint fusion Expected postoperative pain, he will discuss this in further detail with his surgeon.  Metatarsalgia of left foot Foot pain now resolved after 3/4 intermetatarsal bursa injection, custom orthotics with metatarsal pad. Return as needed for this.  I spent 25 minutes with this patient, greater than 50% was face-to-face time counseling regarding the above  diagnoses ___________________________________________ Ihor Austin. Benjamin Stain, M.D., ABFM., CAQSM. Primary Care and Sports Medicine [facility]  Adjunct Instructor of Family Medicine  [employer]

## 2018-03-30 NOTE — Assessment & Plan Note (Signed)
Foot pain now resolved after 3/4 intermetatarsal bursa injection, custom orthotics with metatarsal pad. Return as needed for this.

## 2018-03-30 NOTE — Assessment & Plan Note (Signed)
Expected postoperative pain, he will discuss this in further detail with his surgeon.

## 2018-03-31 ENCOUNTER — Encounter: Payer: Self-pay | Admitting: Sports Medicine

## 2018-04-30 DIAGNOSIS — M545 Low back pain: Secondary | ICD-10-CM | POA: Diagnosis not present

## 2018-05-03 ENCOUNTER — Telehealth: Payer: Self-pay | Admitting: Physical Therapy

## 2018-05-03 ENCOUNTER — Ambulatory Visit (INDEPENDENT_AMBULATORY_CARE_PROVIDER_SITE_OTHER): Payer: BLUE CROSS/BLUE SHIELD | Admitting: Physical Therapy

## 2018-05-03 ENCOUNTER — Encounter: Payer: Self-pay | Admitting: Physical Therapy

## 2018-05-03 DIAGNOSIS — R293 Abnormal posture: Secondary | ICD-10-CM

## 2018-05-03 DIAGNOSIS — G8929 Other chronic pain: Secondary | ICD-10-CM

## 2018-05-03 DIAGNOSIS — M545 Low back pain, unspecified: Secondary | ICD-10-CM

## 2018-05-03 NOTE — Patient Instructions (Addendum)
IONTOPHORESIS PATIENT PRECAUTIONS & CONTRAINDICATIONS:  . Redness under one or both electrodes can occur.  This characterized by a uniform redness that usually disappears within 12 hours of treatment. . Small pinhead size blisters may result in response to the drug.  Contact your physician if the problem persists more than 24 hours. . On rare occasions, iontophoresis therapy can result in temporary skin reactions such as rash, inflammation, irritation or burns.  The skin reactions may be the result of individual sensitivity to the ionic solution used, the condition of the skin at the start of treatment, reaction to the materials in the electrodes, allergies or sensitivity to dexamethasone, or a poor connection between the patch and your skin.  Discontinue using iontophoresis if you have any of these reactions and report to your therapist. . Remove the Patch or electrodes if you have any undue sensation of pain or burning during the treatment and report discomfort to your therapist. . Tell your Therapist if you have had known adverse reactions to the application of electrical current. . If using the Patch, the LED light will turn off when treatment is complete and the patch can be removed.  Approximate treatment time is 1-3 hours.  Remove the patch when light goes off or after 6 hours. . The Patch can be worn during normal activity, however excessive motion where the electrodes have been placed can cause poor contact between the skin and the electrode or uneven electrical current resulting in greater risk of skin irritation. Marland Kitchen Keep out of the reach of children.   . DO NOT use if you have a cardiac pacemaker or any other electrically sensitive implanted device. . DO NOT use if you have a known sensitivity to dexamethasone. . DO NOT use during Magnetic Resonance Imaging (MRI). . DO NOT use over broken or compromised skin (e.g. sunburn, cuts, or acne) due to the increased risk of skin reaction. . DO  NOT SHAVE over the area to be treated:  To establish good contact between the Patch and the skin, excessive hair may be clipped. . DO NOT place the Patch or electrodes on or over your eyes, directly over your heart, or brain. . DO NOT reuse the Patch or electrodes as this may cause burns to occur.   Access Code: [email or web address]  URL: [email or web address]  Date: 05/03/2018  Prepared by: Moshe Cipro   Exercises  Supine Piriformis Stretch with Foot on Ground - 3 reps - 1 sets - 30 sec hold - 2x daily - 7x weekly  Standing Hip Flexor Stretch - 3 reps - 1 sets - 30 sec hold - 1x daily - 7x weekly  Hooklying Single Knee to Chest - 3 reps - 1 sets - 30 sec hold - 2x daily - 7x weekly  Soleus Stretch on Wall - 3 reps - 1 sets - 30 sec hold - 2x daily - 7x weekly  Hip Flexor Stretch at Edge of Bed - 3 reps - 1 sets - 30 sec hold - 1x daily - 7x weekly

## 2018-05-03 NOTE — Therapy (Signed)
[facility] [place] [address] [place], [place], [address] Phone: [phone]   Fax:  [phone]  Physical Therapy Evaluation  Patient Details  Name: Earl [name] MRN: [id number] Date of Birth: [birth year] Referring Provider: Dr. Estill Bamberg   Encounter Date: 05/03/2018  PT End of Session - 05/03/18 1031    Visit Number  1    Number of Visits  12    Date for PT Re-Evaluation  06/14/18    Authorization Type  BCBS    PT Start Time  0715    PT Stop Time  0758    PT Time Calculation (min)  43 min    Activity Tolerance  Patient tolerated treatment well    Behavior During Therapy  [facility] for tasks assessed/performed       Past Medical History:  Diagnosis Date  . Arthritis   . Depression   . Hemorrhoids   . History of skin cancer in adulthood 2012   chest wall  . Hypertension   . Hypogonadism, male   . Insomnia     Past Surgical History:  Procedure Laterality Date  . APPENDECTOMY    . COLONOSCOPY    . HERNIA REPAIR    . JOINT REPLACEMENT    . OSTEOTOMY    . SHOULDER ARTHROSCOPY Left   . TOTAL HIP ARTHROPLASTY Right   . TOTAL KNEE ARTHROPLASTY Left     There were no vitals filed for this visit.   Subjective Assessment - 05/03/18 0717    Subjective  Pt is a 52 y/o male who presents to OPPT s/p Rt SI fusion on 03/16/18.  Pt presents today with c/o chronic pain due to multiple surgeries.  Pt reports ~ 15 SI injections, ablation and then finally SI fusion.  Pt reports continued pain and spasms especially in Rt glutes, as well as decreased flexibility.    Pertinent History  multiple orthopedic surgeries due to trauma    Limitations  Sitting;Walking    How long can you sit comfortably?  2 hours    How long can you walk comfortably?  hasn't tried    Patient Stated Goals  improve flexibility    Currently in Pain?  Yes    Pain Score  4    up to 6-7/10; at best 2-3/10   Pain Location  Back    Pain Orientation  Right     Pain Descriptors / Indicators  Spasm;Tightness    Pain Type  Surgical pain    Pain Onset  More than a month ago    Pain Frequency  Constant    Aggravating Factors   sitting, prolonged walking    Pain Relieving Factors  rest, repositioning         [facility] PT Assessment - 05/03/18 0725      Assessment   Medical Diagnosis  Rt SI joint fusion    Referring Provider  Dr. Estill Bamberg    Onset Date/Surgical Date  03/16/18    Next MD Visit  Oct 2019    Prior Therapy  multiple times      Precautions   Precautions  None      Restrictions   Weight Bearing Restrictions  No      Balance Screen   Has the patient fallen in the past 6 months  No    Has the patient had a decrease in activity level because of a fear of falling?   No    Is the patient reluctant  to leave their home because of a fear of falling?   No      Home Environment   Living Environment  Private residence    Living Arrangements  Alone    Type of Home  Apartment    Home Access  Level entry    Home Layout  One level      Prior Function   Level of Independence  Independent    Vocation  Part time employment;Retired    Gaffer  retired Emergency planning/management officer; part time picks up/delivers new cars for customers    Leisure  gym 1-2x/day (light cardio/weight training in AM then cardio in PM)      Cognition   Overall Cognitive Status  Within Functional Limits for tasks assessed      Observation/Other Assessments   Focus on Therapeutic Outcomes (FOTO)   61 (39%limited; predicted 30% limited)      Posture/Postural Control   Posture/Postural Control  Postural limitations    Postural Limitations  Decreased lumbar lordosis      ROM / Strength   AROM / PROM / Strength  AROM;Strength      AROM   Overall AROM Comments  lumbar ROM WNL except Rt rotation limited ~ 25% and pain with extension      Strength   Overall Strength Comments  bil LEs 5/5      Flexibility   Soft Tissue Assessment /Muscle Length  yes    tight hip flexors and gastrocs   Hamstrings  tightness Rt>Lt    Piriformis  tightness Rt>Lt      Palpation   SI assessment   deferred due to fusion    Palpation comment  tenerness and trigger points noted in Rt glutes and piriformis      Ambulation/Gait   Gait Comments  amb with mild antalgic gait and Rt lateral lean                Objective measurements completed on examination: See above findings.      [facility] Adult PT Treatment/Exercise - 05/03/18 0725      Exercises   Exercises  Lumbar      Lumbar Exercises: Stretches   Single Knee to Chest Stretch  Right;1 rep;30 seconds    Hip Flexor Stretch  Right;1 rep;30 seconds    Hip Flexor Stretch Limitations  supine and standing    Piriformis Stretch  Right;1 rep;30 seconds    Gastroc Stretch  Right;1 rep;30 seconds    Gastroc Stretch Limitations  and soleus stretch      Modalities   Modalities  Iontophoresis      Iontophoresis   Type of Iontophoresis  Dexamethasone    Location  Rt SIJ/glute med    Dose  1 cc    Time  6 hour patch             PT Education - 05/03/18 0838    Education Details  HEP, ionto    Person(s) Educated  Patient    Methods  Explanation   printer error; will provide handouts next visit   Comprehension  Verbalized understanding          PT Long Term Goals - 05/03/18 1037      PT LONG TERM GOAL #1   Title  independent with HEP    Status  New    Target Date  06/14/18      PT LONG TERM GOAL #2   Title  FOTO score improved to </= 30%  limitation for improved functional mobility    Status  New    Target Date  06/14/18      PT LONG TERM GOAL #3   Title  report pain < 4/10 with exercise for improved function and mobility    Status  New    Target Date  06/14/18      PT LONG TERM GOAL #4   Title  report 50% improvement in flexibility for improved function    Status  New    Target Date  06/14/18             Plan - 05/03/18 1031    Clinical Impression Statement  Pt  is a 52 y/o male who presents to OPPT s/p Rt SI fusion on 03/16/18.  Pt presents today with decreased core stability and flexibility as well as constant pain affecting functional mobility.  Pt with significant orthopedic history including multiple surgeries (Rt THA, Lt TKA) and complicated medical history.  Will benefit from PT to maximize functional mobility.    History and Personal Factors relevant to plan of care:  depression, HTN, Rt THA, Lt TKA, Lt shoulder arthroscopy    Clinical Presentation  Evolving    Clinical Presentation due to:  chronic pain    Clinical Decision Making  Moderate    Rehab Potential  Fair    PT Frequency  2x / week    PT Duration  6 weeks    PT Treatment/Interventions  ADLs/Self Care Home Management;Cryotherapy;Electrical Stimulation;Iontophoresis 4mg /ml Dexamethasone;Moist Heat;Therapeutic exercise;Therapeutic activities;Functional mobility training;Stair training;Gait training;Ultrasound;Patient/family education;Manual techniques;Taping;Dry needling;Passive range of motion    PT Next Visit Plan  review stretches; manual therapy for ROM and flexibility, assess response to ionto, modalities PRN for pain, consider DN if MD allows    PT Home Exercise Plan  Access Code: [email or web address]    Consulted and Agree with Plan of Care  Patient       Patient will benefit from skilled therapeutic intervention in order to improve the following deficits and impairments:  Abnormal gait, Pain, Increased fascial restricitons, Increased muscle spasms, Impaired flexibility, Postural dysfunction, Decreased range of motion, Decreased mobility, Improper body mechanics  Visit Diagnosis: Chronic right-sided low back pain without sciatica - Plan: PT plan of care cert/re-cert  Abnormal posture - Plan: PT plan of care cert/re-cert     Problem List Patient Active Problem List   Diagnosis Date Noted  . History of arthroplasty of left knee 03/03/2018  . Hypertension goal BP (blood pressure) < 130/80  02/26/2018  . Generalized edema 02/26/2018  . Erectile dysfunction 02/26/2018  . Migraine without aura and without status migrainosus, not intractable 02/26/2018  . Elevated AST (SGOT) 02/26/2018  . Chronic midline low back pain without sciatica 01/06/2018  . Benign prostatic hyperplasia with urinary frequency 08/28/2017  . Recurrent major depressive disorder, in partial remission (HCC) 07/21/2017  . Methadone use disorder, mild, abuse (HCC) 04/29/2017  . Metatarsalgia of left foot 04/02/2017  . Insomnia 01/22/2017  . Loose total hip arthroplasty (HCC) 12/31/2016  . Mood disorder with depressive features due to medical condition 11/06/2015  . Post right SI joint fusion 11/06/2015  . Low testosterone 07/25/2014  . Osteoarthritis of hip 07/07/2013  . Chronic pain of right knee 08/10/2008  . Eczema 02/16/2007      Clarita Crane, PT, DPT 05/03/18 10:41 AM    [facility] [address] [place], [place], [address] Phone: [phone]   Fax:  [phone]  Name: Nehorai  BELDON NOWLING MRN: [id number] Date of Birth: 05/30/1966

## 2018-05-03 NOTE — Telephone Encounter (Signed)
Dr. Yevette Edwards- Thank you for the PT referral for Earl [name].  We evaluated him today.  I think he would benefit from dry needling to his Rt glutes and piriformis.  Due to his post-op status, would you be okay proceeding with dry needling?  Thank you- Earl [name], PT, DPT 05/03/18 8:45 AM   [facility] [address] [place], [place] [address]  [phone] (office) [phone] (fax)

## 2018-05-05 ENCOUNTER — Encounter: Payer: BLUE CROSS/BLUE SHIELD | Admitting: Rehabilitative and Restorative Service Providers"

## 2018-05-07 ENCOUNTER — Encounter: Payer: Self-pay | Admitting: Physical Therapy

## 2018-05-07 ENCOUNTER — Other Ambulatory Visit: Payer: Self-pay

## 2018-05-07 ENCOUNTER — Ambulatory Visit (INDEPENDENT_AMBULATORY_CARE_PROVIDER_SITE_OTHER): Payer: BLUE CROSS/BLUE SHIELD | Admitting: Physical Therapy

## 2018-05-07 ENCOUNTER — Encounter: Payer: Self-pay | Admitting: Physician Assistant

## 2018-05-07 ENCOUNTER — Ambulatory Visit (INDEPENDENT_AMBULATORY_CARE_PROVIDER_SITE_OTHER): Payer: BLUE CROSS/BLUE SHIELD | Admitting: Physician Assistant

## 2018-05-07 ENCOUNTER — Encounter: Payer: BLUE CROSS/BLUE SHIELD | Admitting: Physical Therapy

## 2018-05-07 VITALS — BP 125/78 | HR 65 | Wt 186.0 lb

## 2018-05-07 DIAGNOSIS — S46012A Strain of muscle(s) and tendon(s) of the rotator cuff of left shoulder, initial encounter: Secondary | ICD-10-CM

## 2018-05-07 DIAGNOSIS — Z9889 Other specified postprocedural states: Secondary | ICD-10-CM | POA: Insufficient documentation

## 2018-05-07 DIAGNOSIS — R740 Nonspecific elevation of levels of transaminase and lactic acid dehydrogenase [LDH]: Principal | ICD-10-CM

## 2018-05-07 DIAGNOSIS — M545 Low back pain, unspecified: Secondary | ICD-10-CM

## 2018-05-07 DIAGNOSIS — G8929 Other chronic pain: Secondary | ICD-10-CM

## 2018-05-07 DIAGNOSIS — H9201 Otalgia, right ear: Secondary | ICD-10-CM | POA: Diagnosis not present

## 2018-05-07 DIAGNOSIS — M26621 Arthralgia of right temporomandibular joint: Secondary | ICD-10-CM | POA: Diagnosis not present

## 2018-05-07 DIAGNOSIS — R293 Abnormal posture: Secondary | ICD-10-CM

## 2018-05-07 DIAGNOSIS — G4701 Insomnia due to medical condition: Secondary | ICD-10-CM | POA: Diagnosis not present

## 2018-05-07 DIAGNOSIS — R7401 Elevation of levels of liver transaminase levels: Secondary | ICD-10-CM

## 2018-05-07 LAB — HEPATIC FUNCTION PANEL
AG RATIO: 1.8 (calc) (ref 1.0–2.5)
ALT: 39 U/L (ref 9–46)
AST: 37 U/L — ABNORMAL HIGH (ref 10–35)
Albumin: 4.8 g/dL (ref 3.6–5.1)
Alkaline phosphatase (APISO): 91 U/L (ref 40–115)
BILIRUBIN DIRECT: 0.1 mg/dL (ref 0.0–0.2)
BILIRUBIN INDIRECT: 0.3 mg/dL (ref 0.2–1.2)
GLOBULIN: 2.6 g/dL (ref 1.9–3.7)
Total Bilirubin: 0.4 mg/dL (ref 0.2–1.2)
Total Protein: 7.4 g/dL (ref 6.1–8.1)

## 2018-05-07 MED ORDER — ZOLPIDEM TARTRATE ER 6.25 MG PO TBCR: 6.2500 mg | EXTENDED_RELEASE_TABLET | Freq: Every evening | ORAL | 0 refills | Status: DC | PRN

## 2018-05-07 MED ORDER — CYCLOBENZAPRINE HCL 5 MG PO TABS: 5.0000 mg | ORAL_TABLET | Freq: Every evening | ORAL | 1 refills | Status: DC | PRN

## 2018-05-07 NOTE — Therapy (Signed)
[facility] [address] [place], [place], [address] Phone: [phone]   Fax:  [phone]  Physical Therapy Treatment  Patient Details  Name: Earl [name] MRN: [id number] Date of Birth: [birth year] Referring Provider: Dr. Estill Bamberg   Encounter Date: 05/07/2018  PT End of Session - 05/07/18 0928    Visit Number  2    Number of Visits  12    Date for PT Re-Evaluation  06/14/18    Authorization Type  [employer]    PT Start Time  0845    PT Stop Time  0927    PT Time Calculation (min)  42 min    Activity Tolerance  Patient tolerated treatment well    Behavior During Therapy  [facility] for tasks assessed/performed       Past Medical History:  Diagnosis Date  . Arthritis   . Depression   . Hemorrhoids   . History of skin cancer in adulthood 2012   chest wall  . Hypertension   . Hypogonadism, male   . Insomnia     Past Surgical History:  Procedure Laterality Date  . APPENDECTOMY    . COLONOSCOPY    . HERNIA REPAIR    . JOINT REPLACEMENT    . OSTEOTOMY    . SHOULDER ARTHROSCOPY Left   . TOTAL HIP ARTHROPLASTY Right   . TOTAL KNEE ARTHROPLASTY Left     There were no vitals filed for this visit.  Subjective Assessment - 05/07/18 0848    Subjective  doing well; back is sore    Patient Stated Goals  improve flexibility    Currently in Pain?  Yes    Pain Score  4     Pain Location  Back    Pain Orientation  Right    Pain Descriptors / Indicators  Spasm;Tightness    Pain Type  Surgical pain    Pain Onset  More than a month ago    Pain Frequency  Constant    Aggravating Factors   sitting, prolonged walking    Pain Relieving Factors  rest, repositioning         [facility] PT Assessment - 05/07/18 0901      Assessment   Medical Diagnosis  Rt SI joint fusion    Referring Provider  Dr. Estill Bamberg    Onset Date/Surgical Date  03/16/18                   [facility] Adult PT Treatment/Exercise - 05/07/18  0849      Exercises   Exercises  Lumbar      Lumbar Exercises: Stretches   Single Knee to Chest Stretch  Right;Left;2 reps;30 seconds    Lower Trunk Rotation  2 reps;30 seconds    Hip Flexor Stretch  Right;Left;2 reps;30 seconds    Hip Flexor Stretch Limitations  standing    Piriformis Stretch  Right;Left;2 reps;30 seconds    Gastroc Stretch  Right;Left;2 reps;30 seconds    Gastroc Stretch Limitations  and soleus stretch      Lumbar Exercises: Aerobic   Nustep  L5 x 5 min      Lumbar Exercises: Supine   Ab Set  10 reps;5 seconds    Clam  10 reps    Clam Limitations  with ab set    Heel Slides  10 reps    Heel Slides Limitations  with ab set    Bent Knee Raise  10 reps    Bent  Knee Raise Limitations  with ab set      Iontophoresis   Type of Iontophoresis  Dexamethasone    Location  Rt SIJ/glute med    Dose  1 cc    Time  6 hour patch             PT Education - 05/07/18 0922    Education Details  HEP    Person(s) Educated  Patient    Methods  Explanation;Demonstration;Handout    Comprehension  Verbalized understanding;Returned demonstration;Verbal cues required;Tactile cues required;Need further instruction          PT Long Term Goals - 05/03/18 1037      PT LONG TERM GOAL #1   Title  independent with HEP    Status  New    Target Date  06/14/18      PT LONG TERM GOAL #2   Title  FOTO score improved to </= 30% limitation for improved functional mobility    Status  New    Target Date  06/14/18      PT LONG TERM GOAL #3   Title  report pain < 4/10 with exercise for improved function and mobility    Status  New    Target Date  06/14/18      PT LONG TERM GOAL #4   Title  report 50% improvement in flexibility for improved function    Status  New    Target Date  06/14/18            [name] - 05/07/18 0928    Clinical Impression Statement  Pt tolerated session well needing min cues for proper technique and form with exercises.  Added core stability  to HEP today.  Will continue to benefit from PT to maximize function.    Rehab Potential  Fair    PT Frequency  2x / week    PT Duration  6 weeks    PT Treatment/Interventions  ADLs/Self Care Home Management;Cryotherapy;Electrical Stimulation;Iontophoresis 4mg /ml Dexamethasone;Moist Heat;Therapeutic exercise;Therapeutic activities;Functional mobility training;Stair training;Gait training;Ultrasound;Patient/family education;Manual techniques;Taping;Dry needling;Passive range of motion    PT Next Visit [name]  review core exercises; manual therapy for ROM and flexibility, assess response to ionto, modalities PRN for pain, consider DN if MD allows    PT Home Exercise [name]  Access Code: [email or web address]    Consulted and Agree with [name] of Care  Patient       Patient will benefit from skilled therapeutic intervention in order to improve the following deficits and impairments:  Abnormal gait, Pain, Increased fascial restricitons, Increased muscle spasms, Impaired flexibility, Postural dysfunction, Decreased range of motion, Decreased mobility, Improper body mechanics  Visit Diagnosis: Chronic right-sided low back pain without sciatica  Abnormal posture     Problem List Patient Active Problem List   Diagnosis Date Noted  . History of arthroplasty of left knee 03/03/2018  . Hypertension goal BP (blood pressure) < 130/80 02/26/2018  . Generalized edema 02/26/2018  . Erectile dysfunction 02/26/2018  . Migraine without aura and without status migrainosus, not intractable 02/26/2018  . Elevated AST (SGOT) 02/26/2018  . Chronic midline low back pain without sciatica 01/06/2018  . Benign prostatic hyperplasia with urinary frequency 08/28/2017  . Recurrent major depressive disorder, in partial remission (HCC) 07/21/2017  . Methadone use disorder, mild, abuse (HCC) 04/29/2017  . Metatarsalgia of left foot 04/02/2017  . Insomnia 01/22/2017  . Loose total hip arthroplasty (HCC) 12/31/2016  . Mood disorder  with depressive features due to medical condition 11/06/2015  .  Post right SI joint fusion 11/06/2015  . Low testosterone 07/25/2014  . Osteoarthritis of hip 07/07/2013  . Chronic pain of right knee 08/10/2008  . Eczema 02/16/2007      Clarita CraneStephanie F Ethie Curless, PT, DPT 05/07/18 9:30 AM    [facility] [address] [place], [place], [address] Phone: [phone]   Fax:  [phone]  Name: Earl [name] MRN: [id number] Date of Birth: [birth year]

## 2018-05-07 NOTE — Progress Notes (Signed)
HPI:                                                                Earl [name] is a 52 y.o. male who presents to [facility]: Primary Care [facility] Medicine today for otalgia  Otalgia   There is pain in the right ear. This is a new problem. The current episode started more than 1 month ago. The problem occurs constantly. The problem has been gradually worsening.   He is also concerned about sleep. He has a history of insomnia related to chronic pain and shift work. He has been taking Lunesta for several years, but is only able to sleep for about 3-4 hours before premature awakening. Unable to fall back asleep. He is wondering if there is any other medication he can try. He has taken Ambien in the past and states he didn't like how it made him feel.   Depression screen [facility] 2/9 02/24/2018  Decreased Interest 0  Down, Depressed, Hopeless 0  PHQ - 2 Score 0  Altered sleeping 3  Tired, decreased energy 0  Change in appetite 0  Feeling bad or failure about yourself  0  Trouble concentrating 0  Moving slowly or fidgety/restless 0  Suicidal thoughts 0  PHQ-9 Score 3    No flowsheet data found.    Past Medical History:  Diagnosis Date  . Arthritis   . Depression   . Hemorrhoids   . History of skin cancer in adulthood 2012   chest wall  . Hypertension   . Hypogonadism, male   . Insomnia    Past Surgical History:  Procedure Laterality Date  . APPENDECTOMY    . COLONOSCOPY    . HERNIA REPAIR    . JOINT REPLACEMENT    . OSTEOTOMY    . SHOULDER ARTHROSCOPY Left   . TOTAL HIP ARTHROPLASTY Right   . TOTAL KNEE ARTHROPLASTY Left    Social History   Tobacco Use  . Smoking status: Never Smoker  . Smokeless tobacco: Never Used  Substance Use Topics  . Alcohol use: Yes   family history includes Aneurysm in his brother; Heart disease in his sister; Hypertension in his sister; Skin cancer in his father; Stroke in his mother.    ROS: negative except as  noted in the HPI  Medications: Current Outpatient Medications  Medication Sig Dispense Refill  . cyclobenzaprine (FLEXERIL) 5 MG tablet Take 1 tablet (5 mg total) by mouth at bedtime as needed for muscle spasms. 30 tablet 1  . Eszopiclone (ESZOPICLONE) 3 MG TABS Take by mouth.    Marland Kitchen ibuprofen (ADVIL,MOTRIN) 200 MG tablet Take 200 mg by mouth every 6 (six) hours as needed.    . Multiple Vitamins-Minerals (MULTIVITAL PO) Take by mouth.    . sildenafil (REVATIO) 20 MG tablet 1-5 tab PO 15 minutes prior to sexual activity as needed 50 tablet 5  . SUMAtriptan (IMITREX) 50 MG tablet Take 1 tablet (50 mg total) by mouth once as needed for up to 1 dose for migraine. May repeat in 2 hours if headache persists or recurs. 9 tablet 1  . testosterone cypionate (DEPOTESTOSTERONE CYPIONATE) 200 MG/ML injection Inject into the muscle.    . zolpidem (AMBIEN CR) 6.25 MG CR tablet Take  1 tablet (6.25 mg total) by mouth at bedtime as needed for sleep. 30 tablet 0   No current facility-administered medications for this visit.    Allergies  Allergen Reactions  . Iodinated Diagnostic Agents Swelling    N/V - rash- red skin   . Morphine And Related Other (See Comments)    Irritability        Objective:  BP 125/78   Pulse 65   Wt 186 lb (84.4 kg)   BMI 30.02 kg/m  Gen:  alert, not ill-appearing, no distress, appropriate for age HEENT: head normocephalic without obvious abnormality, conjunctiva and cornea clear, TM's pearly gray and semi-transparent, external ear canals normal bilaterally, there is right pre-auricular tenderness overlying the TMJ joint, no click, trachea midline    No results found for this or any previous visit (from the past 72 hour(s)). No results found.    Assessment and Plan: 52 y.o. male with   .Diagnoses and all orders for this visit:  TMJ tenderness, right -     cyclobenzaprine (FLEXERIL) 5 MG tablet; Take 1 tablet (5 mg total) by mouth at bedtime as needed for muscle  spasms.  Insomnia due to medical condition -     zolpidem (AMBIEN CR) 6.25 MG CR tablet; Take 1 tablet (6.25 mg total) by mouth at bedtime as needed for sleep.  Otalgia, right    Otalgia is likely 2/2 TMJ syndrome. Normal tympanogram and ear exam.  - apply warm compress - can try a muscle relaxer at bedtime - start wearing nightguard - avoid chewing gum and clenching jaw - rehab exercises daily - if symptoms do not improve, follow-up with your dentist  Insomnia - has failed Lunesta and immediate release Ambien - we will see if we can get Ambien CR approved with insurance - counseled on sleep hygiene  Patient education and anticipatory guidance given Patient agrees with treatment plan Follow-up as needed if symptoms worsen or fail to improve  Levonne Hubertharley E. Cummings PA-C

## 2018-05-07 NOTE — Assessment & Plan Note (Signed)
Suspect infraspinatus strain. Good strength. Ultrasound shows some heterogeneity of the supraspinatus and infraspinatus without any full-thickness or retracted tears. He will avoid overhead activities and lifting greater than 10 pounds. Continue rotator cuff exercises and return as needed.

## 2018-05-07 NOTE — Progress Notes (Signed)
Subjective:    CC: Left shoulder injury  HPI: This is a pleasant 52 year old male, he has a history of a left rotator cuff repair of the supraspinatus with anchor into the bone.  Couple of days ago he was playing pool volleyball, went to take a swing with his left arm and felt severe pain in the posterior aspect of his left shoulder.  This improved slightly over time, no overt weakness.  No popping sensations.  Symptoms are moderate, improving now.  I reviewed the past medical history, family history, social history, surgical history, and allergies today and no changes were needed.  Please see the problem list section below in epic for further details.  Past Medical History: Past Medical History:  Diagnosis Date  . Arthritis   . Depression   . Hemorrhoids   . History of skin cancer in adulthood 2012   chest wall  . Hypertension   . Hypogonadism, male   . Insomnia    Past Surgical History: Past Surgical History:  Procedure Laterality Date  . APPENDECTOMY    . COLONOSCOPY    . HERNIA REPAIR    . JOINT REPLACEMENT    . OSTEOTOMY    . SHOULDER ARTHROSCOPY Left   . TOTAL HIP ARTHROPLASTY Right   . TOTAL KNEE ARTHROPLASTY Left    Social History: Social History   Socioeconomic History  . Marital status: Divorced    Spouse name: Not on file  . Number of children: Not on file  . Years of education: Not on file  . Highest education level: Not on file  Occupational History  . Not on file  Social Needs  . Financial resource strain: Not on file  . Food insecurity:    Worry: Not on file    Inability: Not on file  . Transportation needs:    Medical: Not on file    Non-medical: Not on file  Tobacco Use  . Smoking status: Never Smoker  . Smokeless tobacco: Never Used  Substance and Sexual Activity  . Alcohol use: Yes  . Drug use: Never  . Sexual activity: Yes  Lifestyle  . Physical activity:    Days per week: Not on file    Minutes per session: Not on file  . Stress:  Not on file  Relationships  . Social connections:    Talks on phone: Not on file    Gets together: Not on file    Attends religious service: Not on file    Active member of club or organization: Not on file    Attends meetings of clubs or organizations: Not on file    Relationship status: Not on file  Other Topics Concern  . Not on file  Social History Narrative  . Not on file   Family History: Family History  Problem Relation Age of Onset  . Stroke Mother   . Skin cancer Father   . Aneurysm Brother   . Hypertension Sister   . Heart disease Sister   . Cancer Neg Hx   . Heart attack Neg Hx    Allergies: Allergies  Allergen Reactions  . Iodinated Diagnostic Agents Swelling    N/V - rash- red skin   . Morphine And Related Other (See Comments)    Irritability    Medications: See med rec.  Review of Systems: No fevers, chills, night sweats, weight loss, chest pain, or shortness of breath.   Objective:    General: Well Developed, well nourished, and in no acute  distress.  Neuro: Alert and oriented x3, extra-ocular muscles intact, sensation grossly intact.  HEENT: Normocephalic, atraumatic, pupils equal round reactive to light, neck supple, no masses, no lymphadenopathy, thyroid nonpalpable.  Skin: Warm and dry, no rashes. Cardiac: Regular rate and rhythm, no murmurs rubs or gallops, no lower extremity edema.  Respiratory: Clear to auscultation bilaterally. Not using accessory muscles, speaking in full sentences. Left shoulder: Inspection reveals no abnormalities, atrophy or asymmetry. Palpation is normal with no tenderness over AC joint or bicipital groove. ROM is full in all planes. Rotator cuff strength normal throughout. Minimally positive Neer and Hawkin's tests, empty can. Speeds and Yergason's tests normal. No labral pathology noted with negative Obrien's, negative crank, negative clunk, and good stability. Normal scapular function observed. No painful arc and  no drop arm sign. No apprehension sign  Procedure: Diagnostic Ultrasound of left shoulder Device: GE Logiq E  Findings: I imaged the supraspinatus, infraspinatus, teres minor, subscapularis as well as the long head of the biceps and long and short axes, noted heterogeneity of the supraspinatus and infraspinatus without any full-thickness or retracted tears. Images permanently stored and available for review in the ultrasound unit.  Impression: Supraspinatus and infraspinatus tendinosis without full-thickness retracted tearing.  Impression and Recommendations:    Strain of rotator cuff, left, initial encounter Suspect infraspinatus strain. Good strength. Ultrasound shows some heterogeneity of the supraspinatus and infraspinatus without any full-thickness or retracted tears. He will avoid overhead activities and lifting greater than 10 pounds. Continue rotator cuff exercises and return as needed. ___________________________________________ Earl [name]. Benjamin Stain, M.D., ABFM., CAQSM. Primary Care and Sports Medicine [facility]  Adjunct Instructor of Family Medicine  [employer]

## 2018-05-07 NOTE — Patient Instructions (Signed)
Access Code: [email or web address]  URL: [email or web address]  Date: 05/07/2018  Prepared by: Moshe CiproStephanie Walker Paddack   Exercises  Supine Piriformis Stretch with Foot on Ground - 3 reps - 1 sets - 30 sec hold - 2x daily - 7x weekly  Standing Hip Flexor Stretch - 3 reps - 1 sets - 30 sec hold - 1x daily - 7x weekly  Hooklying Single Knee to Chest - 3 reps - 1 sets - 30 sec hold - 2x daily - 7x weekly  Soleus Stretch on Wall - 3 reps - 1 sets - 30 sec hold - 2x daily - 7x weekly  Hip Flexor Stretch at Edge of Bed - 3 reps - 1 sets - 30 sec hold - 1x daily - 7x weekly  Supine Lower Trunk Rotation - 3 reps - 1 sets - 30 sec hold - 2x daily - 7x weekly  Supine Posterior Pelvic Tilt - 10 reps - 1-2 sets - 5 sec hold - 2x daily - 7x weekly  Bent Knee Fallouts - 10 reps - 1-2 sets - 2x daily - 7x weekly  Supine March - 10 reps - 1-2 sets - 2x daily - 7x weekly  Supine Heel Slides - 10 reps - 1-2 sets - 2x daily - 7x weekly

## 2018-05-07 NOTE — Patient Instructions (Signed)
-   apply warm compress - can try a muscle relaxer at bedtime - start wearing nightguard - avoid chewing gum and clenching jaw - rehab exercises daily - if symptoms do not improve, follow-up with your dentist

## 2018-05-10 ENCOUNTER — Encounter: Payer: Self-pay | Admitting: Rehabilitative and Restorative Service Providers"

## 2018-05-10 ENCOUNTER — Ambulatory Visit (INDEPENDENT_AMBULATORY_CARE_PROVIDER_SITE_OTHER): Payer: BLUE CROSS/BLUE SHIELD | Admitting: Rehabilitative and Restorative Service Providers"

## 2018-05-10 ENCOUNTER — Encounter: Payer: Self-pay | Admitting: Physician Assistant

## 2018-05-10 ENCOUNTER — Ambulatory Visit: Payer: BLUE CROSS/BLUE SHIELD | Admitting: Sports Medicine

## 2018-05-10 DIAGNOSIS — M545 Low back pain, unspecified: Secondary | ICD-10-CM

## 2018-05-10 DIAGNOSIS — G8929 Other chronic pain: Secondary | ICD-10-CM | POA: Diagnosis not present

## 2018-05-10 DIAGNOSIS — R293 Abnormal posture: Secondary | ICD-10-CM

## 2018-05-10 NOTE — Therapy (Signed)
[facility] [address] [place], [place], [address] Phone: [phone]   Fax:  [phone]  Physical Therapy Treatment  Patient Details  Name: Earl [name] MRN: [id number] Date of Birth: 07/08/1966 Referring Provider: Dr Estill BambergMark Dumonski   Encounter Date: 05/10/2018  PT End of Session - 05/10/18 0810    Visit Number  3    Number of Visits  12    Date for PT Re-Evaluation  06/14/18    Authorization Type  [employer]    PT Start Time  0805    PT Stop Time  0857    PT Time Calculation (min)  52 min       Past Medical History:  Diagnosis Date  . Arthritis   . Depression   . Hemorrhoids   . History of skin cancer in adulthood 2012   chest wall  . Hypertension   . Hypogonadism, male   . Insomnia     Past Surgical History:  Procedure Laterality Date  . APPENDECTOMY    . COLONOSCOPY    . HERNIA REPAIR    . JOINT REPLACEMENT    . OSTEOTOMY    . SHOULDER ARTHROSCOPY Left   . TOTAL HIP ARTHROPLASTY Right   . TOTAL KNEE ARTHROPLASTY Left     There were no vitals filed for this visit.  Subjective Assessment - 05/10/18 0808    Subjective  Patient reports that he was with his son yesterday while son practiced his motor bike racing.  Walked several miles yesterday.     Currently in Pain?  Yes    Pain Score  5     Pain Orientation  Right    Pain Descriptors / Indicators  Tightness;Spasm         [facility] PT Assessment - 05/10/18 0001      Assessment   Medical Diagnosis  Rt SI joint fusion    Referring Provider  Dr Estill BambergMark Dumonski    Onset Date/Surgical Date  03/16/18    Prior Therapy  yes       Palpation   Palpation comment  tender and tightness Rt piriformis and gluts                     [facility] Adult PT Treatment/Exercise - 05/10/18 0001      Lumbar Exercises: Stretches   Passive Hamstring Stretch  Right;Left;2 reps;30 seconds   supine with strap    Single Knee to Chest Stretch  Right;Left;2 reps;30  seconds    Lower Trunk Rotation  2 reps;30 seconds    Hip Flexor Stretch  Right;Left;2 reps;30 seconds    Hip Flexor Stretch Limitations  standing    Piriformis Stretch  Right;Left;2 reps;30 seconds    Gastroc Stretch  Right;Left;2 reps;30 seconds    Gastroc Stretch Limitations  and soleus stretch      Lumbar Exercises: Aerobic   Nustep  L5 x 5 min      Lumbar Exercises: Supine   Ab Set  10 reps;5 seconds    Clam  10 reps    Clam Limitations  with ab set    Heel Slides  10 reps    Heel Slides Limitations  with ab set    Bent Knee Raise  10 reps    Bent Knee Raise Limitations  with ab set      Moist Heat Therapy   Number Minutes Moist Heat  15 Minutes    Moist Heat Location  Lumbar Spine  Programme researcher, broadcasting/film/video Location  Lt SI area     Electrical Stimulation Action  IFC    Electrical Stimulation Parameters  to tolerance    Electrical Stimulation Goals  Pain;Tone      Manual Therapy   Manual therapy comments  pt prone     Soft tissue mobilization  soft tissue work through the posterior Rt hip - piriformis and gluts              PT Education - 05/10/18 0821    Education Details  importance of consistent HEP and graded activity; TENS     Person(s) Educated  Patient    Methods  Explanation    Comprehension  Verbalized understanding          PT Long Term Goals - 05/10/18 9147      PT LONG TERM GOAL #1   Title  independent with HEP    Status  On-going      PT LONG TERM GOAL #2   Title  FOTO score improved to </= 30% limitation for improved functional mobility    Status  On-going      PT LONG TERM GOAL #3   Title  report pain < 4/10 with exercise for improved function and mobility    Status  On-going      PT LONG TERM GOAL #4   Title  report 50% improvement in flexibility for improved function    Status  On-going            Plan - 05/10/18 0811    Clinical Impression Statement  Patient reports significtant increase in  pain today from activities over the weekend. he can't tell any difference with the ionto. Patient has difficulty performing exercises correctly. no progress toward goals of theapy noted today.     Rehab Potential  Fair    PT Frequency  2x / week    PT Duration  6 weeks    PT Treatment/Interventions  ADLs/Self Care Home Management;Cryotherapy;Electrical Stimulation;Iontophoresis 4mg /ml Dexamethasone;Moist Heat;Therapeutic exercise;Therapeutic activities;Functional mobility training;Stair training;Gait training;Ultrasound;Patient/family education;Manual techniques;Taping;Dry needling;Passive range of motion    PT Next Visit Plan  review core exercises; manual therapy for ROM and flexibility, modalities PRN for pain, consider DN if MD allows    PT Home Exercise Plan  Access Code: [email or web address]    Consulted and Agree with Plan of Care  Patient       Patient will benefit from skilled therapeutic intervention in order to improve the following deficits and impairments:  Abnormal gait, Pain, Increased fascial restricitons, Increased muscle spasms, Impaired flexibility, Postural dysfunction, Decreased range of motion, Decreased mobility, Improper body mechanics  Visit Diagnosis: Chronic right-sided low back pain without sciatica  Abnormal posture     Problem List Patient Active Problem List   Diagnosis Date Noted  . TMJ tenderness, right 05/07/2018  . Strain of rotator cuff, left, initial encounter 05/07/2018  . History of arthroplasty of left knee 03/03/2018  . Hypertension goal BP (blood pressure) < 130/80 02/26/2018  . Generalized edema 02/26/2018  . Erectile dysfunction 02/26/2018  . Migraine without aura and without status migrainosus, not intractable 02/26/2018  . Elevated AST (SGOT) 02/26/2018  . Chronic midline low back pain without sciatica 01/06/2018  . Benign prostatic hyperplasia with urinary frequency 08/28/2017  . Recurrent major depressive disorder, in partial remission (HCC)  07/21/2017  . Methadone use disorder, mild, abuse (HCC) 04/29/2017  . Metatarsalgia of left foot 04/02/2017  . Insomnia 01/22/2017  .  Loose total hip arthroplasty (HCC) 12/31/2016  . Mood disorder with depressive features due to medical condition 11/06/2015  . Post right SI joint fusion 11/06/2015  . Low testosterone 07/25/2014  . Osteoarthritis of hip 07/07/2013  . Chronic pain of right knee 08/10/2008  . Eczema 02/16/2007    Oslo Huntsman Rober Minion PT, MPH  05/10/2018, 8:42 AM  [facility] [address] [place], [place], [address] Phone: [phone]   Fax:  [phone]  Name: Earl [name] MRN: [id number] Date of Birth: 10-11-1965

## 2018-05-10 NOTE — Patient Instructions (Signed)

## 2018-05-12 ENCOUNTER — Encounter: Payer: Self-pay | Admitting: Physician Assistant

## 2018-05-12 ENCOUNTER — Encounter: Payer: Self-pay | Admitting: Rehabilitative and Restorative Service Providers"

## 2018-05-12 ENCOUNTER — Ambulatory Visit (INDEPENDENT_AMBULATORY_CARE_PROVIDER_SITE_OTHER): Payer: BLUE CROSS/BLUE SHIELD | Admitting: Rehabilitative and Restorative Service Providers"

## 2018-05-12 DIAGNOSIS — R293 Abnormal posture: Secondary | ICD-10-CM | POA: Diagnosis not present

## 2018-05-12 DIAGNOSIS — G8929 Other chronic pain: Secondary | ICD-10-CM

## 2018-05-12 DIAGNOSIS — M545 Low back pain: Secondary | ICD-10-CM | POA: Diagnosis not present

## 2018-05-12 NOTE — Therapy (Signed)
[facility] [address] [place], [place], [address] Phone: [phone]   Fax:  [phone]  Physical Therapy Treatment  Patient Details  Name: Earl [name] MRN: [id number] Date of Birth: 03-Oct-1965 Referring Provider: Dr Estill BambergMark Dumonski   Encounter Date: 05/12/2018  PT End of Session - 05/12/18 0804    Visit Number  4    Number of Visits  12    Date for PT Re-Evaluation  06/14/18    PT Start Time  0803    PT Stop Time  0849    PT Time Calculation (min)  46 min    Activity Tolerance  Patient tolerated treatment well       Past Medical History:  Diagnosis Date  . Arthritis   . Depression   . Hemorrhoids   . History of skin cancer in adulthood 2012   chest wall  . Hypertension   . Hypogonadism, male   . Insomnia     Past Surgical History:  Procedure Laterality Date  . APPENDECTOMY    . COLONOSCOPY    . HERNIA REPAIR    . JOINT REPLACEMENT    . OSTEOTOMY    . SHOULDER ARTHROSCOPY Left   . TOTAL HIP ARTHROPLASTY Right   . TOTAL KNEE ARTHROPLASTY Left     There were no vitals filed for this visit.  Subjective Assessment - 05/12/18 0811    Subjective  Patient reports that he is really tight and painful today. Had a difficult time with driving for work Monday. Continued pain limiting activities in clinic. Patient requests moist heat before exercises today.     Currently in Pain?  Yes    Pain Score  5     Pain Location  Back    Pain Orientation  Right    Pain Descriptors / Indicators  Tightness;Spasm    Pain Onset  More than a month ago    Pain Frequency  Constant                       [facility] Adult PT Treatment/Exercise - 05/12/18 0001      Exercises   Exercises  Lumbar      Lumbar Exercises: Stretches   Passive Hamstring Stretch  Right;Left;2 reps;30 seconds   supine with strap    Single Knee to Chest Stretch  Right;Left;30 seconds;4 reps    Lower Trunk Rotation  2 reps;30 seconds       Lumbar Exercises: Aerobic   UBE (Upper Arm Bike)  L3 x 4 min alt each minute       Lumbar Exercises: Standing   Wall Slides  10 reps;5 seconds   with abds tight; therapy ball at back    Other Standing Lumbar Exercises  arms over head sm bouncing on wall overhead core tight 1 min x 2       Lumbar Exercises: Supine   Ab Set  10 reps;5 seconds    Clam  10 reps;5 seconds    Clam Limitations  with ab set greeen TB hold one LE still move opposite to side     Bridge  10 reps;5 seconds    Bridge Limitations  with ab set       Lumbar Exercises: Sidelying   Clam  Right;Left;10 reps      Lumbar Exercises: Prone   Other Prone Lumbar Exercises  axial extension w/ scap squeeze lift 2 sec x 10       Moist  Heat Therapy   Number Minutes Moist Heat  15 Minutes    Moist Heat Location  Lumbar Spine      Electrical Stimulation   Electrical Stimulation Location  Rt SI area     Electrical Stimulation Action  IFC    Electrical Stimulation Parameters  to tolerance    Electrical Stimulation Goals  Pain;Tone                  PT Long Term Goals - 05/10/18 1610      PT LONG TERM GOAL #1   Title  independent with HEP    Status  On-going      PT LONG TERM GOAL #2   Title  FOTO score improved to </= 30% limitation for improved functional mobility    Status  On-going      PT LONG TERM GOAL #3   Title  report pain < 4/10 with exercise for improved function and mobility    Status  On-going      PT LONG TERM GOAL #4   Title  report 50% improvement in flexibility for improved function    Status  On-going            [name] - 05/12/18 9604    Clinical Impression Statement  Continued pain in the LB. Patient requests moist heat to warm up before exercise due to soreness and stiffness and pain. Patient has difficulty with positions for exercises/activities. Patient remains pain focused.    Rehab Potential  Fair    PT Frequency  2x / week    PT Duration  6 weeks    PT  Treatment/Interventions  ADLs/Self Care Home Management;Cryotherapy;Electrical Stimulation;Iontophoresis 4mg /ml Dexamethasone;Moist Heat;Therapeutic exercise;Therapeutic activities;Functional mobility training;Stair training;Gait training;Ultrasound;Patient/family education;Manual techniques;Taping;Dry needling;Passive range of motion    PT Next Visit [name]  review core exercises; manual therapy for ROM and flexibility, modalities PRN for pain, consider DN if MD allows    PT Home Exercise [name]  Access Code: [email or web address]    Consulted and Agree with [name] of Care  Patient       Patient will benefit from skilled therapeutic intervention in order to improve the following deficits and impairments:  Abnormal gait, Pain, Increased fascial restricitons, Increased muscle spasms, Impaired flexibility, Postural dysfunction, Decreased range of motion, Decreased mobility, Improper body mechanics  Visit Diagnosis: Chronic right-sided low back pain without sciatica  Abnormal posture     Problem List Patient Active Problem List   Diagnosis Date Noted  . TMJ tenderness, right 05/07/2018  . Strain of rotator cuff, left, initial encounter 05/07/2018  . History of arthroplasty of left knee 03/03/2018  . Hypertension goal BP (blood pressure) < 130/80 02/26/2018  . Generalized edema 02/26/2018  . Erectile dysfunction 02/26/2018  . Migraine without aura and without status migrainosus, not intractable 02/26/2018  . Elevated AST (SGOT) 02/26/2018  . Chronic midline low back pain without sciatica 01/06/2018  . Benign prostatic hyperplasia with urinary frequency 08/28/2017  . Recurrent major depressive disorder, in partial remission (HCC) 07/21/2017  . Methadone use disorder, mild, abuse (HCC) 04/29/2017  . Metatarsalgia of left foot 04/02/2017  . Insomnia 01/22/2017  . Loose total hip arthroplasty (HCC) 12/31/2016  . Mood disorder with depressive features due to medical condition 11/06/2015  . Post right SI  joint fusion 11/06/2015  . Low testosterone 07/25/2014  . Osteoarthritis of hip 07/07/2013  . Chronic pain of right knee 08/10/2008  . Eczema 02/16/2007    Earl [name] PT, MPH  05/12/2018, 8:48 AM  [facility] [address] [place], [place], [address] Phone: [phone]   Fax:  [phone]  Name: Earl [name] MRN: [id number] Date of Birth: [birth year]

## 2018-05-14 ENCOUNTER — Encounter: Payer: BLUE CROSS/BLUE SHIELD | Admitting: Physical Therapy

## 2018-05-17 ENCOUNTER — Ambulatory Visit (INDEPENDENT_AMBULATORY_CARE_PROVIDER_SITE_OTHER): Payer: BLUE CROSS/BLUE SHIELD | Admitting: Physical Therapy

## 2018-05-17 ENCOUNTER — Encounter: Payer: Self-pay | Admitting: Physical Therapy

## 2018-05-17 DIAGNOSIS — G8929 Other chronic pain: Secondary | ICD-10-CM | POA: Diagnosis not present

## 2018-05-17 DIAGNOSIS — R293 Abnormal posture: Secondary | ICD-10-CM | POA: Diagnosis not present

## 2018-05-17 DIAGNOSIS — M545 Low back pain: Secondary | ICD-10-CM

## 2018-05-17 NOTE — Therapy (Signed)
[place], [place], [address] Phone: [phone]   Fax:  [phone]  Physical Therapy Treatment  Patient Details  Name: Earl [name] MRN: [id number] Date of Birth: [birth year] Referring Provider: Dr Phylliss Bob   Encounter Date: 05/17/2018  PT End of Session - 05/17/18 0804    Visit Number  5    Number of Visits  12    Date for PT Re-Evaluation  06/14/18    PT Start Time  0715    PT Stop Time  0813    PT Time Calculation (min)  58 min    Activity Tolerance  Patient tolerated treatment well    Behavior During Therapy  [facility] for tasks assessed/performed       Past Medical History:  Diagnosis Date  . Arthritis   . Depression   . Hemorrhoids   . History of skin cancer in adulthood 2012   chest wall  . Hypertension   . Hypogonadism, male   . Insomnia     Past Surgical History:  Procedure Laterality Date  . APPENDECTOMY    . COLONOSCOPY    . HERNIA REPAIR    . JOINT REPLACEMENT    . OSTEOTOMY    . SHOULDER ARTHROSCOPY Left   . TOTAL HIP ARTHROPLASTY Right   . TOTAL KNEE ARTHROPLASTY Left     There were no vitals filed for this visit.  Subjective Assessment - 05/17/18 0717    Subjective  having some good days and some bad days; "all depends on activity level for me."    Pertinent History  multiple orthopedic surgeries due to trauma    Patient Stated Goals  improve flexibility    Currently in Pain?  Yes    Pain Score  3     Pain Location  Back    Pain Orientation  Right    Pain Descriptors / Indicators  Tightness;Spasm    Pain Type  Surgical pain    Pain Onset  More than a month ago    Aggravating Factors   sitting, prolonged walking    Pain Relieving Factors  rest, repositioning         [facility] PT Assessment - 05/17/18 0719      Assessment   Medical Diagnosis  Rt SI joint fusion    Referring Provider  Dr Phylliss Bob    Onset Date/Surgical Date  03/16/18                    [facility] Adult PT Treatment/Exercise - 05/17/18 0718      Exercises   Exercises  Lumbar      Lumbar Exercises: Stretches   Single Knee to Chest Stretch  Right;Left;2 reps;30 seconds    Lower Trunk Rotation  --   10x10" with green physioball   Piriformis Stretch  Right;Left;2 reps;30 seconds      Lumbar Exercises: Aerobic   Nustep  L5 x 6 min      Lumbar Exercises: Standing   Wall Slides  10 reps;5 seconds   with abds tight; therapy ball at back    Wall Slides Limitations  static wall sit with horizontal abduction; green theraband x 10 reps; external rotation x 10 reps    Scapular Retraction  Both;10 reps;Theraband    Theraband Level (Scapular Retraction)  Level 3 (Green)    Shoulder Extension  Both;10 reps;Theraband    Theraband Level (Shoulder Extension)  Level 3 (Green)    Other  Standing Lumbar Exercises  trunk rotation with green theraband x 10 bil      Lumbar Exercises: Supine   Bridge  10 reps;5 seconds    Bridge Limitations  on green physioball; bridging with hamstring curls on green physioball x 10 rept      Moist Heat Therapy   Number Minutes Moist Heat  15 Minutes    Moist Heat Location  Lumbar Spine      Electrical Stimulation   Electrical Stimulation Location  Rt SI area     Electrical Stimulation Action  IFC    Electrical Stimulation Parameters  to tolerance    Electrical Stimulation Goals  Pain;Tone                  PT Long Term Goals - 05/10/18 5397      PT LONG TERM GOAL #1   Title  independent with HEP    Status  On-going      PT LONG TERM GOAL #2   Title  FOTO score improved to </= 30% limitation for improved functional mobility    Status  On-going      PT LONG TERM GOAL #3   Title  report pain < 4/10 with exercise for improved function and mobility    Status  On-going      PT LONG TERM GOAL #4   Title  report 50% improvement in flexibility for improved function    Status  On-going            Plan -  05/17/18 0805    Clinical Impression Statement  Pt reporting decreased pain today and feels he is able to increase his activity since beginning PT.  Progressing well, no goals consistently met at this time, but pain today improved.    Rehab Potential  Fair    PT Frequency  2x / week    PT Duration  6 weeks    PT Treatment/Interventions  ADLs/Self Care Home Management;Cryotherapy;Electrical Stimulation;Iontophoresis '4mg'$ /ml Dexamethasone;Moist Heat;Therapeutic exercise;Therapeutic activities;Functional mobility training;Stair training;Gait training;Ultrasound;Patient/family education;Manual techniques;Taping;Dry needling;Passive range of motion    PT Next Visit Plan  review core exercises; manual therapy for ROM and flexibility, modalities PRN for pain, consider DN if MD allows    PT Home Exercise Plan  Access Code: [email or web address]    Consulted and Agree with Plan of Care  Patient       Patient will benefit from skilled therapeutic intervention in order to improve the following deficits and impairments:  Abnormal gait, Pain, Increased fascial restricitons, Increased muscle spasms, Impaired flexibility, Postural dysfunction, Decreased range of motion, Decreased mobility, Improper body mechanics  Visit Diagnosis: Chronic right-sided low back pain without sciatica  Abnormal posture     Problem List Patient Active Problem List   Diagnosis Date Noted  . TMJ tenderness, right 05/07/2018  . Strain of rotator cuff, left, initial encounter 05/07/2018  . History of arthroplasty of left knee 03/03/2018  . Hypertension goal BP (blood pressure) < 130/80 02/26/2018  . Generalized edema 02/26/2018  . Erectile dysfunction 02/26/2018  . Migraine without aura and without status migrainosus, not intractable 02/26/2018  . Elevated AST (SGOT) 02/26/2018  . Chronic midline low back pain without sciatica 01/06/2018  . Benign prostatic hyperplasia with urinary frequency 08/28/2017  . Recurrent major depressive  disorder, in partial remission ([place]) 07/21/2017  . Methadone use disorder, mild, abuse ([place]) 04/29/2017  . Metatarsalgia of left foot 04/02/2017  . Insomnia 01/22/2017  . Loose total hip arthroplasty ([place]) 12/31/2016  .  Mood disorder with depressive features due to medical condition 11/06/2015  . Post right SI joint fusion 11/06/2015  . Low testosterone 07/25/2014  . Osteoarthritis of hip 07/07/2013  . Chronic pain of right knee 08/10/2008  . Eczema 02/16/2007      Laureen Abrahams, PT, DPT 05/17/18 8:07 AM    [facility] [place], [place], [address] Phone: [phone]   Fax:  [phone]  Name: Earl [name] MRN: [id number] Date of Birth: 1965-09-21

## 2018-05-18 ENCOUNTER — Encounter: Payer: Self-pay | Admitting: Physician Assistant

## 2018-05-19 ENCOUNTER — Encounter: Payer: Self-pay | Admitting: Physical Therapy

## 2018-05-19 ENCOUNTER — Ambulatory Visit (INDEPENDENT_AMBULATORY_CARE_PROVIDER_SITE_OTHER): Payer: BLUE CROSS/BLUE SHIELD | Admitting: Physical Therapy

## 2018-05-19 DIAGNOSIS — G8929 Other chronic pain: Secondary | ICD-10-CM | POA: Diagnosis not present

## 2018-05-19 DIAGNOSIS — M545 Low back pain: Secondary | ICD-10-CM

## 2018-05-19 DIAGNOSIS — R293 Abnormal posture: Secondary | ICD-10-CM

## 2018-05-19 NOTE — Therapy (Signed)
[facility] [place] [address] [place], [place], [address] Phone: [phone]   Fax:  [phone]  Physical Therapy Treatment  Patient Details  Name: Earl [name] MRN: [id number] Date of Birth: Dec 31, 1965 Referring Provider: Dr Estill Bamberg   Encounter Date: 05/19/2018  PT End of Session - 05/19/18 0806    Visit Number  6    Number of Visits  12    Date for PT Re-Evaluation  06/14/18    Authorization Type  BCBS    PT Start Time  0803    PT Stop Time  0858   MHP last 12 min    PT Time Calculation (min)  55 min       Past Medical History:  Diagnosis Date  . Arthritis   . Depression   . Hemorrhoids   . History of skin cancer in adulthood 2012   chest wall  . Hypertension   . Hypogonadism, male   . Insomnia     Past Surgical History:  Procedure Laterality Date  . APPENDECTOMY    . COLONOSCOPY    . HERNIA REPAIR    . JOINT REPLACEMENT    . OSTEOTOMY    . SHOULDER ARTHROSCOPY Left   . TOTAL HIP ARTHROPLASTY Right   . TOTAL KNEE ARTHROPLASTY Left     There were no vitals filed for this visit.  Subjective Assessment - 05/19/18 0807    Subjective  Pt reports he has had more pain since last session.   Pt reports he thinks the estim made his SI feel worse.    Pertinent History  multiple orthopedic surgeries due to trauma    Currently in Pain?  Yes    Pain Score  4     Pain Location  Sacrum    Pain Orientation  Right    Pain Descriptors / Indicators  Aching;Dull    Aggravating Factors   sitting, bending over    Pain Relieving Factors  rest, repositioning         [facility] PT Assessment - 05/19/18 0001      Assessment   Medical Diagnosis  Rt SI joint fusion    Referring Provider  Dr Estill Bamberg    Onset Date/Surgical Date  03/16/18    Next MD Visit  06/11/18        [facility] Adult PT Treatment/Exercise - 05/19/18 0001      Self-Care   Self-Care  Other Self-Care Comments    Other Self-Care Comments    Avoid forward flexion (ie: hamstring/ITB stretch with touching toes in standing),  self massage with roller stick to Rt hip/LE.       Exercises   Exercises  Lumbar      Lumbar Exercises: Stretches   Passive Hamstring Stretch  30 seconds;Right;3 reps;Left;2 reps   supine with strap    Single Knee to Chest Stretch  Right;Left;2 reps    Single Knee to Chest Stretch Limitations  (changed to bilat hooklying start position, hands underneath knee towards chest)    Hip Flexor Stretch  Right;Left;2 reps;30 seconds    Hip Flexor Stretch Limitations  seated, 2nd rep with arm overhead    Piriformis Stretch  Right;Left;2 reps;30 seconds      Lumbar Exercises: Aerobic   Recumbent Bike  L2: 5.5 min    PTA present to discuss progress     Lumbar Exercises: Standing   Other Standing Lumbar Exercises  anti-rotation with green band, hands in and out from core  x 10 each side.       Lumbar Exercises: Supine   Bridge  10 reps;5 seconds   with ball squeeze   Bridge Limitations  with ab set       Lumbar Exercises: Sidelying   Other Sidelying Lumbar Exercises  thoracic rotation with arm in T x 10 in Lt sidelying; 5 in Rt sidelying       Lumbar Exercises: Prone   Opposite Arm/Leg Raise  Right arm/Left leg;Left arm/Right leg;10 reps      Moist Heat Therapy   Number Minutes Moist Heat  12 Minutes    Moist Heat Location  Lumbar Spine;Hip   Rt     Electrical Stimulation   Electrical Stimulation Location  --   pt declined.             PT Education - 05/19/18 0818    Education Details  anatomy of pelvis; importance of avoiding forward flexing of back (toe touch stretch).     Person(s) Educated  Patient    Methods  Explanation    Comprehension  Verbalized understanding          PT Long Term Goals - 05/10/18 1610      PT LONG TERM GOAL #1   Title  independent with HEP    Status  On-going      PT LONG TERM GOAL #2   Title  FOTO score improved to </= 30% limitation for improved  functional mobility    Status  On-going      PT LONG TERM GOAL #3   Title  report pain < 4/10 with exercise for improved function and mobility    Status  On-going      PT LONG TERM GOAL #4   Title  report 50% improvement in flexibility for improved function    Status  On-going            Plan - 05/19/18 0856    Clinical Impression Statement  Pt declined use of estim at end of session; he feels it may have irritated his Rt SI after last session.  Pt tolerated all exercises well without increase in symptoms.  Pt educated on neutral pelvis and encouraged to avoid aggrivating postures/positions.  Progressing gradually towards goals.     Rehab Potential  Fair    PT Frequency  2x / week    PT Duration  6 weeks    PT Treatment/Interventions  ADLs/Self Care Home Management;Cryotherapy;Electrical Stimulation;Iontophoresis 4mg /ml Dexamethasone;Moist Heat;Therapeutic exercise;Therapeutic activities;Functional mobility training;Stair training;Gait training;Ultrasound;Patient/family education;Manual techniques;Taping;Dry needling;Passive range of motion    PT Next Visit Plan  manual therapy for ROM and flexibility, modalities PRN for pain, consider DN if MD allows    PT Home Exercise Plan  Access Code: [email or web address]    Consulted and Agree with Plan of Care  Patient       Patient will benefit from skilled therapeutic intervention in order to improve the following deficits and impairments:  Abnormal gait, Pain, Increased fascial restricitons, Increased muscle spasms, Impaired flexibility, Postural dysfunction, Decreased range of motion, Decreased mobility, Improper body mechanics  Visit Diagnosis: Chronic right-sided low back pain without sciatica  Abnormal posture     Problem List Patient Active Problem List   Diagnosis Date Noted  . TMJ tenderness, right 05/07/2018  . Strain of rotator cuff, left, initial encounter 05/07/2018  . History of arthroplasty of left knee 03/03/2018  .  Hypertension goal BP (blood pressure) < 130/80 02/26/2018  . Generalized edema 02/26/2018  .  Erectile dysfunction 02/26/2018  . Migraine without aura and without status migrainosus, not intractable 02/26/2018  . Elevated AST (SGOT) 02/26/2018  . Chronic midline low back pain without sciatica 01/06/2018  . Benign prostatic hyperplasia with urinary frequency 08/28/2017  . Recurrent major depressive disorder, in partial remission (HCC) 07/21/2017  . Methadone use disorder, mild, abuse (HCC) 04/29/2017  . Metatarsalgia of left foot 04/02/2017  . Insomnia 01/22/2017  . Loose total hip arthroplasty (HCC) 12/31/2016  . Mood disorder with depressive features due to medical condition 11/06/2015  . Post right SI joint fusion 11/06/2015  . Low testosterone 07/25/2014  . Osteoarthritis of hip 07/07/2013  . Chronic pain of right knee 08/10/2008  . Eczema 02/16/2007   Mayer Camel, PTA 05/19/18 11:42 AM  [facility] [place] [address] [place], [place], [address] Phone: [phone]   Fax:  [phone]  Name: Earl [name] MRN: [id number] Date of Birth: Nov 29, 1965

## 2018-05-21 ENCOUNTER — Encounter: Payer: BLUE CROSS/BLUE SHIELD | Admitting: Physical Therapy

## 2018-05-24 ENCOUNTER — Ambulatory Visit (INDEPENDENT_AMBULATORY_CARE_PROVIDER_SITE_OTHER): Payer: BLUE CROSS/BLUE SHIELD | Admitting: Physical Therapy

## 2018-05-24 ENCOUNTER — Encounter: Payer: Self-pay | Admitting: Physical Therapy

## 2018-05-24 DIAGNOSIS — G8929 Other chronic pain: Secondary | ICD-10-CM

## 2018-05-24 DIAGNOSIS — M545 Low back pain, unspecified: Secondary | ICD-10-CM

## 2018-05-24 DIAGNOSIS — R293 Abnormal posture: Secondary | ICD-10-CM | POA: Diagnosis not present

## 2018-05-24 NOTE — Therapy (Signed)
[facility] [place] [address] [place], [place], [address] Phone: [phone]   Fax:  [phone]  Physical Therapy Treatment  Patient Details  Name: Earl [name] MRN: [id number] Date of Birth: 02-13-1966 Referring Provider (PT): Dr Estill Bamberg   Encounter Date: 05/24/2018  PT End of Session - 05/24/18 0839    Visit Number  7    Number of Visits  12    Date for PT Re-Evaluation  06/14/18    Authorization Type  BCBS    PT Start Time  0800    PT Stop Time  0830   pt needed to leave early   PT Time Calculation (min)  30 min    Activity Tolerance  Patient tolerated treatment well    Behavior During Therapy  [facility] for tasks assessed/performed       Past Medical History:  Diagnosis Date  . Arthritis   . Depression   . Hemorrhoids   . History of skin cancer in adulthood 2012   chest wall  . Hypertension   . Hypogonadism, male   . Insomnia     Past Surgical History:  Procedure Laterality Date  . APPENDECTOMY    . COLONOSCOPY    . HERNIA REPAIR    . JOINT REPLACEMENT    . OSTEOTOMY    . SHOULDER ARTHROSCOPY Left   . TOTAL HIP ARTHROPLASTY Right   . TOTAL KNEE ARTHROPLASTY Left     There were no vitals filed for this visit.  Subjective Assessment - 05/24/18 0758    Subjective  needs to leave early today; feels like symptoms are back to before surgery "I have good days and bad days."  feels pain isn't improving but flexibility is    Pertinent History  multiple orthopedic surgeries due to trauma    Patient Stated Goals  improve flexibility    Currently in Pain?  Yes    Pain Score  3     Pain Location  Sacrum    Pain Orientation  Right    Pain Descriptors / Indicators  Aching;Dull    Pain Type  Surgical pain    Pain Onset  More than a month ago    Pain Frequency  Constant    Aggravating Factors   sitting, bending over    Pain Relieving Factors  rest, repositioning         [facility] PT Assessment - 05/24/18  0808      Assessment   Medical Diagnosis  Rt SI joint fusion    Referring Provider (PT)  Dr Estill Bamberg    Onset Date/Surgical Date  03/16/18    Next MD Visit  06/11/18      Observation/Other Assessments   Focus on Therapeutic Outcomes (FOTO)   61 (39% limited)                   [facility] Adult PT Treatment/Exercise - 05/24/18 0801      Exercises   Exercises  Lumbar      Lumbar Exercises: Stretches   Single Knee to Chest Stretch  Right;Left;2 reps;30 seconds    Lower Trunk Rotation  2 reps;30 seconds    Piriformis Stretch  Right;Left;2 reps;30 seconds      Lumbar Exercises: Aerobic   Nustep  L5 x 6 min      Lumbar Exercises: Supine   Other Supine Lumbar Exercises  isometric hip abduction 10 x 5 sec      Lumbar Exercises: Sidelying  Clam  Right;Left;10 reps      Lumbar Exercises: Prone   Straight Leg Raise  10 reps;3 seconds   bil                 PT Long Term Goals - 05/24/18 0840      PT LONG TERM GOAL #1   Title  independent with HEP    Status  On-going    Target Date  06/14/18      PT LONG TERM GOAL #2   Title  FOTO score improved to </= 30% limitation for improved functional mobility    Baseline  9/30: score unchanged    Status  On-going      PT LONG TERM GOAL #3   Title  report pain < 4/10 with exercise for improved function and mobility    Baseline  9/30: pt reports no improvement in pain    Status  On-going      PT LONG TERM GOAL #4   Title  report 50% improvement in flexibility for improved function    Status  On-going            Plan - 05/24/18 0840    Clinical Impression Statement  Pt with inconsistent reports of improvement in pain since beginning PT, and states "it depends on the day and some days are better than others" when asking about benefits of PT.  Pt reports some improvement in flexibility, but FOTO score unchanges from initial evaluation.  Recommend DN but have not heard from MD if he will allow due to post op  status, so request refaxed to MD today.  If MD doesn't allow DN then will hold PT until after he sees MD, but if so then plan to try DN to see if pt has any added benefit in pain and function.      Rehab Potential  Fair    PT Frequency  2x / week    PT Duration  6 weeks    PT Treatment/Interventions  ADLs/Self Care Home Management;Cryotherapy;Electrical Stimulation;Iontophoresis 4mg /ml Dexamethasone;Moist Heat;Therapeutic exercise;Therapeutic activities;Functional mobility training;Stair training;Gait training;Ultrasound;Patient/family education;Manual techniques;Taping;Dry needling;Passive range of motion    PT Next Visit Plan  manual therapy for ROM and flexibility, modalities PRN for pain, consider DN if MD allows    PT Home Exercise Plan  Access Code: [email or web address]    Consulted and Agree with Plan of Care  Patient       Patient will benefit from skilled therapeutic intervention in order to improve the following deficits and impairments:  Abnormal gait, Pain, Increased fascial restricitons, Increased muscle spasms, Impaired flexibility, Postural dysfunction, Decreased range of motion, Decreased mobility, Improper body mechanics  Visit Diagnosis: Chronic right-sided low back pain without sciatica  Abnormal posture     Problem List Patient Active Problem List   Diagnosis Date Noted  . TMJ tenderness, right 05/07/2018  . Strain of rotator cuff, left, initial encounter 05/07/2018  . History of arthroplasty of left knee 03/03/2018  . Hypertension goal BP (blood pressure) < 130/80 02/26/2018  . Generalized edema 02/26/2018  . Erectile dysfunction 02/26/2018  . Migraine without aura and without status migrainosus, not intractable 02/26/2018  . Elevated AST (SGOT) 02/26/2018  . Chronic midline low back pain without sciatica 01/06/2018  . Benign prostatic hyperplasia with urinary frequency 08/28/2017  . Recurrent major depressive disorder, in partial remission (HCC) 07/21/2017  . Methadone  use disorder, mild, abuse (HCC) 04/29/2017  . Metatarsalgia of left foot 04/02/2017  . Insomnia 01/22/2017  .  Loose total hip arthroplasty (HCC) 12/31/2016  . Mood disorder with depressive features due to medical condition 11/06/2015  . Post right SI joint fusion 11/06/2015  . Low testosterone 07/25/2014  . Osteoarthritis of hip 07/07/2013  . Chronic pain of right knee 08/10/2008  . Eczema 02/16/2007      Clarita Crane, PT, DPT 05/24/18 8:44 AM    [facility] [address] [place], [place], [address] Phone: [phone]   Fax:  [phone]  Name: Earl [name] MRN: [id number] Date of Birth: June 09, 1966

## 2018-05-26 ENCOUNTER — Encounter: Payer: BLUE CROSS/BLUE SHIELD | Admitting: Physical Therapy

## 2018-05-31 ENCOUNTER — Encounter: Payer: BLUE CROSS/BLUE SHIELD | Admitting: Physical Therapy

## 2018-05-31 ENCOUNTER — Ambulatory Visit: Payer: BLUE CROSS/BLUE SHIELD | Admitting: Physician Assistant

## 2018-06-02 ENCOUNTER — Encounter: Payer: BLUE CROSS/BLUE SHIELD | Admitting: Physical Therapy

## 2018-06-07 ENCOUNTER — Encounter: Payer: BLUE CROSS/BLUE SHIELD | Admitting: Physical Therapy

## 2018-06-09 ENCOUNTER — Ambulatory Visit (INDEPENDENT_AMBULATORY_CARE_PROVIDER_SITE_OTHER): Payer: BLUE CROSS/BLUE SHIELD | Admitting: Physical Therapy

## 2018-06-09 ENCOUNTER — Encounter: Payer: BLUE CROSS/BLUE SHIELD | Admitting: Physical Therapy

## 2018-06-09 ENCOUNTER — Encounter: Payer: Self-pay | Admitting: Physical Therapy

## 2018-06-09 DIAGNOSIS — R293 Abnormal posture: Secondary | ICD-10-CM | POA: Diagnosis not present

## 2018-06-09 DIAGNOSIS — G8929 Other chronic pain: Secondary | ICD-10-CM

## 2018-06-09 DIAGNOSIS — M545 Low back pain, unspecified: Secondary | ICD-10-CM

## 2018-06-09 NOTE — Therapy (Addendum)
[place], [place], [address] Phone: [phone]   Fax:  [phone]  Physical Therapy Treatment/Discharge  Patient Details  Name: Earl [name] MRN: [id number] Date of Birth: [birth year] Referring Provider (PT): Dr Phylliss Bob   Encounter Date: 06/09/2018  PT End of Session - 06/09/18 0840    Visit Number  8    Number of Visits  12    Date for PT Re-Evaluation  06/14/18    Authorization Type  BCBS    PT Start Time  0802    PT Stop Time  0841    PT Time Calculation (min)  39 min    Activity Tolerance  Patient tolerated treatment well    Behavior During Therapy  [facility] for tasks assessed/performed       Past Medical History:  Diagnosis Date  . Arthritis   . Depression   . Hemorrhoids   . History of skin cancer in adulthood 2012   chest wall  . Hypertension   . Hypogonadism, male   . Insomnia     Past Surgical History:  Procedure Laterality Date  . APPENDECTOMY    . COLONOSCOPY    . HERNIA REPAIR    . JOINT REPLACEMENT    . OSTEOTOMY    . SHOULDER ARTHROSCOPY Left   . TOTAL HIP ARTHROPLASTY Right   . TOTAL KNEE ARTHROPLASTY Left     There were no vitals filed for this visit.  Subjective Assessment - 06/09/18 0805    Subjective  has order for DN from MD.  feels better after taking a break from exercise.  went to gym last night and did okay. feels 50% more flexible.    Pertinent History  multiple orthopedic surgeries due to trauma    Patient Stated Goals  improve flexibility    Currently in Pain?  Yes    Pain Score  1     Pain Location  Sacrum    Pain Orientation  Right    Pain Descriptors / Indicators  Aching;Dull    Pain Type  Surgical pain    Pain Onset  More than a month ago    Pain Frequency  Constant    Aggravating Factors   sitting, bending over    Pain Relieving Factors  rest, repositioning                       [facility] Adult PT Treatment/Exercise -  06/09/18 0806      Lumbar Exercises: Aerobic   Nustep  L5 x 6 min      Lumbar Exercises: Supine   Bridge  10 reps;5 seconds    Bridge Limitations  with strap for isometric hip abduction with reduction in pain      Lumbar Exercises: Sidelying   Clam  Right;10 reps;3 seconds;Left      Lumbar Exercises: Prone   Straight Leg Raise  10 reps;3 seconds   bil     Manual Therapy   Manual Therapy  Soft tissue mobilization    Manual therapy comments  skilled palpation and monitoring of soft tissue during DN    Soft tissue mobilization  Rt glutes and piriformis       Trigger Point Dry Needling - 06/09/18 0829    Consent Given?  Yes    Education Handout Provided  Yes    Muscles Treated Lower Body  Gluteus maximus    Gluteus Maximus Response  Twitch response elicited;Palpable increased  muscle length   and glute medius               PT Long Term Goals - 06/09/18 0840      PT LONG TERM GOAL #1   Title  independent with HEP    Status  On-going    Target Date  06/14/18      PT LONG TERM GOAL #2   Title  FOTO score improved to </= 30% limitation for improved functional mobility    Baseline  9/30: score unchanged    Status  On-going      PT LONG TERM GOAL #3   Title  report pain < 4/10 with exercise for improved function and mobility    Baseline  10/16: pain up to 3/10 over past week    Status  Achieved      PT LONG TERM GOAL #4   Title  report 50% improvement in flexibility for improved function    Status  Achieved            Plan - 06/09/18 0841    Clinical Impression Statement  Pt with good twitch responses to DN and manual therapy today.  Two LTGs met at this time, but continues to report pain at times.  To see MD this week and determine next steps.    Rehab Potential  Fair    PT Frequency  2x / week    PT Duration  6 weeks    PT Treatment/Interventions  ADLs/Self Care Home Management;Cryotherapy;Electrical Stimulation;Iontophoresis '4mg'$ /ml Dexamethasone;Moist  Heat;Therapeutic exercise;Therapeutic activities;Functional mobility training;Stair training;Gait training;Ultrasound;Patient/family education;Manual techniques;Taping;Dry needling;Passive range of motion    PT Next Visit Plan  follow up on MD visit, needs renewal if continuing PT    PT Home Exercise Plan  Access Code: [email or web address]    Consulted and Agree with Plan of Care  Patient       Patient will benefit from skilled therapeutic intervention in order to improve the following deficits and impairments:  Abnormal gait, Pain, Increased fascial restricitons, Increased muscle spasms, Impaired flexibility, Postural dysfunction, Decreased range of motion, Decreased mobility, Improper body mechanics  Visit Diagnosis: Chronic right-sided low back pain without sciatica  Abnormal posture     Problem List Patient Active Problem List   Diagnosis Date Noted  . TMJ tenderness, right 05/07/2018  . Strain of rotator cuff, left, initial encounter 05/07/2018  . History of arthroplasty of left knee 03/03/2018  . Hypertension goal BP (blood pressure) < 130/80 02/26/2018  . Generalized edema 02/26/2018  . Erectile dysfunction 02/26/2018  . Migraine without aura and without status migrainosus, not intractable 02/26/2018  . Elevated AST (SGOT) 02/26/2018  . Chronic midline low back pain without sciatica 01/06/2018  . Benign prostatic hyperplasia with urinary frequency 08/28/2017  . Recurrent major depressive disorder, in partial remission ([place]) 07/21/2017  . Methadone use disorder, mild, abuse ([place]) 04/29/2017  . Metatarsalgia of left foot 04/02/2017  . Insomnia 01/22/2017  . Loose total hip arthroplasty ([place]) 12/31/2016  . Mood disorder with depressive features due to medical condition 11/06/2015  . Post right SI joint fusion 11/06/2015  . Low testosterone 07/25/2014  . Osteoarthritis of hip 07/07/2013  . Chronic pain of right knee 08/10/2008  . Eczema 02/16/2007      Laureen Abrahams, PT,  DPT 06/09/18 8:43 AM    [facility] [place], [place], [address] Phone: [phone]   Fax:  [phone]  Name: Earl [name] MRN: [id number]  Date of Birth: 11/05/1965     PHYSICAL THERAPY DISCHARGE SUMMARY  Visits from Start of Care: 8  Current functional level related to goals / functional outcomes: See above   Remaining deficits: unknown   Education / Equipment: HEP  Plan: Patient agrees to discharge.  Patient goals were partially met. Patient is being discharged due to not returning since the last visit.  ?????     Laureen Abrahams, PT, DPT 07/15/18 1:54 PM  [facility] [address] [facility] [place], [place] [address]  [phone] (office) [phone] (fax)

## 2018-06-11 ENCOUNTER — Ambulatory Visit: Payer: BLUE CROSS/BLUE SHIELD | Admitting: Physician Assistant

## 2018-06-11 DIAGNOSIS — M545 Low back pain: Secondary | ICD-10-CM | POA: Diagnosis not present

## 2018-06-15 ENCOUNTER — Ambulatory Visit (INDEPENDENT_AMBULATORY_CARE_PROVIDER_SITE_OTHER): Payer: BLUE CROSS/BLUE SHIELD | Admitting: Sports Medicine

## 2018-06-15 DIAGNOSIS — M461 Sacroiliitis, not elsewhere classified: Secondary | ICD-10-CM | POA: Diagnosis not present

## 2018-06-15 DIAGNOSIS — G4700 Insomnia, unspecified: Secondary | ICD-10-CM | POA: Diagnosis not present

## 2018-06-15 DIAGNOSIS — S46012A Strain of muscle(s) and tendon(s) of the rotator cuff of left shoulder, initial encounter: Secondary | ICD-10-CM | POA: Diagnosis not present

## 2018-06-15 NOTE — Assessment & Plan Note (Signed)
Persistent pain, subacromial injection, return in a month.

## 2018-06-15 NOTE — Progress Notes (Signed)
Subjective:    CC: Left shoulder pain  HPI: This is a pleasant 52 year old male, post rotator cuff repair, now with pain over the deltoid, worse with overhead activities.  We did a month of conservative measures and he continues to have discomfort.  I reviewed the past medical history, family history, social history, surgical history, and allergies today and no changes were needed.  Please see the problem list section below in epic for further details.  Past Medical History: Past Medical History:  Diagnosis Date  . Arthritis   . Depression   . Hemorrhoids   . History of skin cancer in adulthood 2012   chest wall  . Hypertension   . Hypogonadism, male   . Insomnia    Past Surgical History: Past Surgical History:  Procedure Laterality Date  . APPENDECTOMY    . COLONOSCOPY    . HERNIA REPAIR    . JOINT REPLACEMENT    . OSTEOTOMY    . SHOULDER ARTHROSCOPY Left   . TOTAL HIP ARTHROPLASTY Right   . TOTAL KNEE ARTHROPLASTY Left    Social History: Social History   Socioeconomic History  . Marital status: Divorced    Spouse name: Not on file  . Number of children: Not on file  . Years of education: Not on file  . Highest education level: Not on file  Occupational History  . Not on file  Social Needs  . Financial resource strain: Not on file  . Food insecurity:    Worry: Not on file    Inability: Not on file  . Transportation needs:    Medical: Not on file    Non-medical: Not on file  Tobacco Use  . Smoking status: Never Smoker  . Smokeless tobacco: Never Used  Substance and Sexual Activity  . Alcohol use: Yes  . Drug use: Never  . Sexual activity: Yes  Lifestyle  . Physical activity:    Days per week: Not on file    Minutes per session: Not on file  . Stress: Not on file  Relationships  . Social connections:    Talks on phone: Not on file    Gets together: Not on file    Attends religious service: Not on file    Active member of club or organization: Not  on file    Attends meetings of clubs or organizations: Not on file    Relationship status: Not on file  Other Topics Concern  . Not on file  Social History Narrative  . Not on file   Family History: Family History  Problem Relation Age of Onset  . Stroke Mother   . Skin cancer Father   . Aneurysm Brother   . Hypertension Sister   . Heart disease Sister   . Cancer Neg Hx   . Heart attack Neg Hx    Allergies: Allergies  Allergen Reactions  . Iodinated Diagnostic Agents Swelling    N/V - rash- red skin   . Morphine And Related Other (See Comments)    Irritability    Medications: See med rec.  Review of Systems: No fevers, chills, night sweats, weight loss, chest pain, or shortness of breath.   Objective:    General: Well Developed, well nourished, and in no acute distress.  Neuro: Alert and oriented x3, extra-ocular muscles intact, sensation grossly intact.  HEENT: Normocephalic, atraumatic, pupils equal round reactive to light, neck supple, no masses, no lymphadenopathy, thyroid nonpalpable.  Skin: Warm and dry, no rashes. Cardiac: Regular  rate and rhythm, no murmurs rubs or gallops, no lower extremity edema.  Respiratory: Clear to auscultation bilaterally. Not using accessory muscles, speaking in full sentences. Left shoulder: Inspection reveals no abnormalities, atrophy or asymmetry. Palpation is normal with no tenderness over AC joint or bicipital groove. ROM is full in all planes. Rotator cuff strength normal throughout. Positive Neer and Hawkin's tests, empty can. Speeds and Yergason's tests normal. No labral pathology noted with negative Obrien's, negative crank, negative clunk, and good stability. Normal scapular function observed. No painful arc and no drop arm sign. No apprehension sign  Procedure: Real-time Ultrasound Guided Injection of left subacromial bursa Device: GE Logiq E  Verbal informed consent obtained.  Time-out conducted.  Noted no overlying  erythema, induration, or other signs of local infection.  Skin prepped in a sterile fashion.  Local anesthesia: Topical Ethyl chloride.  With sterile technique and under real time ultrasound guidance: 1 cc Kenalog 40, 1 cc lidocaine, 1 cc bupivacaine injected easily. Completed without difficulty  Pain immediately resolved suggesting accurate placement of the medication.  Advised to call if fevers/chills, erythema, induration, drainage, or persistent bleeding.  Images permanently stored and available for review in the ultrasound unit.  Impression: Technically successful ultrasound guided injection.  Impression and Recommendations:    Strain of rotator cuff, left, initial encounter Persistent pain, subacromial injection, return in a month. ___________________________________________ Earl [name]. Benjamin Stain, M.D., ABFM., CAQSM. Primary Care and Sports Medicine [facility]  Adjunct Professor of Family Medicine  [employer]

## 2018-06-17 DIAGNOSIS — F5104 Psychophysiologic insomnia: Secondary | ICD-10-CM | POA: Diagnosis not present

## 2018-06-17 DIAGNOSIS — F3341 Major depressive disorder, recurrent, in partial remission: Secondary | ICD-10-CM | POA: Diagnosis not present

## 2018-06-25 HISTORY — PX: SHOULDER ARTHROSCOPY: SHX128

## 2018-06-28 DIAGNOSIS — G8921 Chronic pain due to trauma: Secondary | ICD-10-CM | POA: Diagnosis not present

## 2018-06-28 DIAGNOSIS — F3341 Major depressive disorder, recurrent, in partial remission: Secondary | ICD-10-CM | POA: Diagnosis not present

## 2018-06-30 DIAGNOSIS — M25512 Pain in left shoulder: Secondary | ICD-10-CM | POA: Diagnosis not present

## 2018-07-05 DIAGNOSIS — M25512 Pain in left shoulder: Secondary | ICD-10-CM | POA: Diagnosis not present

## 2018-07-11 ENCOUNTER — Encounter: Payer: Self-pay | Admitting: Physician Assistant

## 2018-07-11 DIAGNOSIS — F5104 Psychophysiologic insomnia: Secondary | ICD-10-CM

## 2018-07-12 DIAGNOSIS — F3341 Major depressive disorder, recurrent, in partial remission: Secondary | ICD-10-CM | POA: Diagnosis not present

## 2018-07-12 MED ORDER — ESZOPICLONE 3 MG PO TABS: 3.0000 mg | ORAL_TABLET | Freq: Every evening | ORAL | 0 refills | Status: DC | PRN

## 2018-07-13 ENCOUNTER — Encounter: Payer: Self-pay | Admitting: Sports Medicine

## 2018-07-20 DIAGNOSIS — M7582 Other shoulder lesions, left shoulder: Secondary | ICD-10-CM | POA: Diagnosis not present

## 2018-07-20 DIAGNOSIS — S46012A Strain of muscle(s) and tendon(s) of the rotator cuff of left shoulder, initial encounter: Secondary | ICD-10-CM | POA: Diagnosis not present

## 2018-07-20 DIAGNOSIS — M7542 Impingement syndrome of left shoulder: Secondary | ICD-10-CM | POA: Diagnosis not present

## 2018-07-20 DIAGNOSIS — M75122 Complete rotator cuff tear or rupture of left shoulder, not specified as traumatic: Secondary | ICD-10-CM | POA: Diagnosis not present

## 2018-07-20 DIAGNOSIS — M25512 Pain in left shoulder: Secondary | ICD-10-CM | POA: Diagnosis not present

## 2018-07-20 DIAGNOSIS — M75112 Incomplete rotator cuff tear or rupture of left shoulder, not specified as traumatic: Secondary | ICD-10-CM | POA: Diagnosis not present

## 2018-07-20 DIAGNOSIS — G8918 Other acute postprocedural pain: Secondary | ICD-10-CM | POA: Diagnosis not present

## 2018-07-22 DIAGNOSIS — K7689 Other specified diseases of liver: Secondary | ICD-10-CM | POA: Diagnosis not present

## 2018-07-22 DIAGNOSIS — R932 Abnormal findings on diagnostic imaging of liver and biliary tract: Secondary | ICD-10-CM | POA: Diagnosis not present

## 2018-07-22 DIAGNOSIS — I10 Essential (primary) hypertension: Secondary | ICD-10-CM | POA: Diagnosis not present

## 2018-07-22 DIAGNOSIS — N200 Calculus of kidney: Secondary | ICD-10-CM | POA: Diagnosis not present

## 2018-07-22 DIAGNOSIS — F329 Major depressive disorder, single episode, unspecified: Secondary | ICD-10-CM | POA: Diagnosis not present

## 2018-07-22 DIAGNOSIS — N400 Enlarged prostate without lower urinary tract symptoms: Secondary | ICD-10-CM | POA: Diagnosis not present

## 2018-07-22 DIAGNOSIS — Z79899 Other long term (current) drug therapy: Secondary | ICD-10-CM | POA: Diagnosis not present

## 2018-07-22 DIAGNOSIS — R109 Unspecified abdominal pain: Secondary | ICD-10-CM | POA: Diagnosis not present

## 2018-07-22 DIAGNOSIS — Z96641 Presence of right artificial hip joint: Secondary | ICD-10-CM | POA: Diagnosis not present

## 2018-07-22 DIAGNOSIS — Z885 Allergy status to narcotic agent status: Secondary | ICD-10-CM | POA: Diagnosis not present

## 2018-07-22 DIAGNOSIS — R339 Retention of urine, unspecified: Secondary | ICD-10-CM | POA: Diagnosis not present

## 2018-07-23 DIAGNOSIS — G4730 Sleep apnea, unspecified: Secondary | ICD-10-CM | POA: Diagnosis not present

## 2018-07-23 DIAGNOSIS — F5104 Psychophysiologic insomnia: Secondary | ICD-10-CM | POA: Diagnosis not present

## 2018-07-26 DIAGNOSIS — R339 Retention of urine, unspecified: Secondary | ICD-10-CM | POA: Diagnosis not present

## 2018-07-27 DIAGNOSIS — I10 Essential (primary) hypertension: Secondary | ICD-10-CM | POA: Diagnosis not present

## 2018-07-27 DIAGNOSIS — Z79899 Other long term (current) drug therapy: Secondary | ICD-10-CM | POA: Diagnosis not present

## 2018-07-27 DIAGNOSIS — Z85828 Personal history of other malignant neoplasm of skin: Secondary | ICD-10-CM | POA: Diagnosis not present

## 2018-07-27 DIAGNOSIS — Z885 Allergy status to narcotic agent status: Secondary | ICD-10-CM | POA: Diagnosis not present

## 2018-07-27 DIAGNOSIS — R7989 Other specified abnormal findings of blood chemistry: Secondary | ICD-10-CM | POA: Diagnosis not present

## 2018-07-27 DIAGNOSIS — R310 Gross hematuria: Secondary | ICD-10-CM | POA: Diagnosis not present

## 2018-07-27 DIAGNOSIS — N400 Enlarged prostate without lower urinary tract symptoms: Secondary | ICD-10-CM | POA: Diagnosis not present

## 2018-07-30 ENCOUNTER — Encounter: Payer: Self-pay | Admitting: Rehabilitative and Restorative Service Providers"

## 2018-07-30 ENCOUNTER — Ambulatory Visit (INDEPENDENT_AMBULATORY_CARE_PROVIDER_SITE_OTHER): Payer: BLUE CROSS/BLUE SHIELD | Admitting: Rehabilitative and Restorative Service Providers"

## 2018-07-30 DIAGNOSIS — R531 Weakness: Secondary | ICD-10-CM

## 2018-07-30 DIAGNOSIS — M25512 Pain in left shoulder: Secondary | ICD-10-CM | POA: Diagnosis not present

## 2018-07-30 DIAGNOSIS — R293 Abnormal posture: Secondary | ICD-10-CM | POA: Diagnosis not present

## 2018-07-30 NOTE — Patient Instructions (Addendum)
Axial Extension (Chin Tuck)    Pull chin in and lengthen back of neck. Hold __5__ seconds while counting out loud. Repeat __10__ times. Do __several__ sessions per day.   AROM: Lateral Neck Flexion    Slowly tilt head toward one shoulder, then the other. Hold each position ___10-15_ seconds. Repeat __3__ times per set.  Do __2-3__ sessions per day.  Shoulder Blade Squeeze    Rotate shoulders back, then squeeze shoulder blades together. Repeat ____ times. Do ____ sessions per day.  Bring Lt arm to side keeping towel between elbow and side  Hold 10 sec repeat 10 times  NO MORE THAN 30 deg  2-3 times     [facility] [address] [place], [place] [address]  [phone] (office) [phone] (fax)

## 2018-07-30 NOTE — Therapy (Signed)
[facility] [place] [address] [place], [place], [address] Phone: [phone]   Fax:  [phone]  Physical Therapy Evaluation  Patient Details  Name: Earl [name] MRN: [id number] Date of Birth: [birth year] Referring Provider (PT): Dr Casimiro Needle Lauffenburger/Kathryn Meiners PA-C    Encounter Date: 07/30/2018  PT End of Session - 07/30/18 0910    Visit Number  1    Number of Visits  12    Date for PT Re-Evaluation  09/10/18    Authorization Type  BCBS    PT Start Time  0802    PT Stop Time  0846    PT Time Calculation (min)  44 min    Activity Tolerance  Patient tolerated treatment well       Past Medical History:  Diagnosis Date  . Arthritis   . Depression   . Hemorrhoids   . History of skin cancer in adulthood 2012   chest wall  . Hypertension   . Hypogonadism, male   . Insomnia     Past Surgical History:  Procedure Laterality Date  . APPENDECTOMY    . COLONOSCOPY    . HERNIA REPAIR    . JOINT REPLACEMENT    . OSTEOTOMY    . SHOULDER ARTHROSCOPY Left   . TOTAL HIP ARTHROPLASTY Right   . TOTAL KNEE ARTHROPLASTY Left     There were no vitals filed for this visit.   Subjective Assessment - 07/30/18 0811    Subjective  Patient reports pain in Lt shoulder over the past several months. Symptoms have increased since September. MRI showed showed tears and degenerative changes in the Lt shoulder. He underwent surgical repair 07/20/18.     Pertinent History  Injury to Lt shoulder 2011; surgery 12/17/17; 4 surgeries Lt foot; 6 Lt knee; 2 Rt leg; 2 Rt hip; SI joint fusion; hernia repair; appentectomy; Lt wirst x 1; Lt elbow x 3 Rt shoulder x 1; Arthritis; skin cancer; recent urinary tract issues with ED visit     Diagnostic tests  MRI     Patient Stated Goals  wants to return to full use of the Lt shoulder- gain ROM and strength in Lt UE     Currently in Pain?  Yes    Pain Score  2     Pain Location  Shoulder    Pain  Orientation  Left    Pain Descriptors / Indicators  Dull;Aching    Pain Type  Surgical pain;Chronic pain    Pain Radiating Towards  into biceps area     Pain Onset  1 to 4 weeks ago    Pain Frequency  Constant    Aggravating Factors   unknown - usually worse in the am     Pain Relieving Factors  rest; hot shower          [facility] PT Assessment - 07/30/18 0001      Assessment   Medical Diagnosis  Lt RCR    Referring Provider (PT)  Dr Casimiro Needle Lauffenburger/Kathryn Meiners PA-C     Onset Date/Surgical Date  07/20/18    Hand Dominance  Right    Next MD Visit  09/01/18    Prior Therapy  yes       Restrictions   Weight Bearing Restrictions  Yes    Other Position/Activity Restrictions  see post op protocol       Balance Screen   Has the patient fallen in the past 6 months  No  Has the patient had a decrease in activity level because of a fear of falling?   No    Is the patient reluctant to leave their home because of a fear of falling?   No      Prior Function   Level of Independence  Independent    Vocation  Retired;Part time employment    Vocation Requirements  transporter     Leisure  work out 10 times a week in the gym 5 days a week twice a day - cardio and wt training ~ 1 hour; driving for work; household chores       Observation/Other Assessments   Observations  scope sites covered with steristrips - dry and intact; ecchymosis noted Lt shoulder; edema noted through the shoulder area     Focus on Therapeutic Outcomes (FOTO)   72% limitation       Sensation   Additional Comments  WFL's per pt report       Posture/Postural Control   Posture Comments  head forward; shoulders rounded       AROM   Right Shoulder Extension  68 Degrees    Right Shoulder Flexion  143 Degrees    Right Shoulder ABduction  162 Degrees    Right Shoulder Internal Rotation  35 Degrees    Right Shoulder External Rotation  93 Degrees      PROM   Left Shoulder Flexion  110 Degrees    Left Shoulder  External Rotation  30 Degrees      Strength   Overall Strength Comments  WNL's Rt UE - not tested Lt UE due to surgery                 Objective measurements completed on examination: See above findings.      [facility] Adult PT Treatment/Exercise - 07/30/18 0001      Self-Care   Self-Care  --   education re-post op protocol - precautions for shd      Shoulder Exercises: Seated   Other Seated Exercises  lateral cervical flexion focus on going to Rt - tight Lt - 10-15 sec hold x 3 reps       Shoulder Exercises: Standing   Retraction  Strengthening;Both;10 reps   10 sec hold with noodle along spine      Shoulder Exercises: ROM/Strengthening   Pendulum  20 circles       Shoulder Exercises: Stretch   External Rotation Stretch  3 reps;10 seconds   PROM using cane limited to 30-35 deg             PT Education - 07/30/18 0848    Education Details  HEP     Person(s) Educated  Patient    Methods  Explanation;Demonstration;Tactile cues;Verbal cues;Handout    Comprehension  Verbalized understanding;Returned demonstration;Verbal cues required;Tactile cues required          PT Long Term Goals - 07/30/18 0914      PT LONG TERM GOAL #1   Title  Progress shoulder rehab per protocol providing patient with education in activities/exercises allowed per protocol 09/10/18    Time  6    Period  Weeks    Status  New    Target Date  09/10/18      PT LONG TERM GOAL #2   Title  Increase ROM Lt shoulder to 130 deg flexion 35 deg ER per protocol 09/20/18    Baseline  -    Time  6    Period  Weeks    Status  New      PT LONG TERM GOAL #3   Title  progress with AROM/strengthening/increasing functional activities as indicated 09/10/18    Baseline  -    Time  6    Period  Weeks    Status  New      PT LONG TERM GOAL #4   Title  Independent in HEP 09/10/18    Time  6    Period  Weeks    Status  New      PT LONG TERM GOAL #5   Title  Improve FOTO to </= 37% limitation  09/10/18    Time  6    Period  Weeks    Status  New             [name] - 07/30/18 0910    Clinical Impression Statement  Earl [name] returns to clinic s/p Lt RCR 07/20/18. He has poor posture and alignment; limited ROM/strength/function Lt UE; decreased ADL tolerance; pain on an intermittent basis. Patient will benefit from PT to address problems identified.    History and Personal Factors relevant to [name] of care:  multiple orthopedic surgeries including prior Lt RCR 4/19     Clinical Presentation  Stable    Clinical Decision Making  Low    Rehab Potential  Good    PT Frequency  2x / week    PT Duration  6 weeks    PT Treatment/Interventions  Patient/family education;ADLs/Self Care Home Management;Cryotherapy;Electrical Stimulation;Iontophoresis 4mg /ml Dexamethasone;Moist Heat;Dry needling;Manual techniques;Neuromuscular re-education;Therapeutic activities;Therapeutic exercise    PT Next Visit [name]  review HEP; continue education re importance of following protocol; progress shoulder rehab per protocol     Consulted and Agree with [name] of Care  Patient       Patient will benefit from skilled therapeutic intervention in order to improve the following deficits and impairments:  Postural dysfunction, Improper body mechanics, Pain, Increased fascial restricitons, Decreased mobility, Decreased range of motion, Decreased strength, Decreased activity tolerance  Visit Diagnosis: Acute pain of left shoulder - [name]: PT [name] of care cert/re-cert  Weakness generalized - [name]: PT [name] of care cert/re-cert  Abnormal posture - [name]: PT [name] of care cert/re-cert     Problem List Patient Active Problem List   Diagnosis Date Noted  . TMJ tenderness, right 05/07/2018  . Strain of rotator cuff, left, initial encounter 05/07/2018  . History of arthroplasty of left knee 03/03/2018  . Hypertension goal BP (blood pressure) < 130/80 02/26/2018  . Generalized edema 02/26/2018  . Erectile dysfunction  02/26/2018  . Migraine without aura and without status migrainosus, not intractable 02/26/2018  . Elevated AST (SGOT) 02/26/2018  . Chronic midline low back pain without sciatica 01/06/2018  . Benign prostatic hyperplasia with urinary frequency 08/28/2017  . Recurrent major depressive disorder, in partial remission (HCC) 07/21/2017  . Methadone use disorder, mild, abuse (HCC) 04/29/2017  . Metatarsalgia of left foot 04/02/2017  . Insomnia 01/22/2017  . Loose total hip arthroplasty (HCC) 12/31/2016  . Mood disorder with depressive features due to medical condition 11/06/2015  . Post right SI joint fusion 11/06/2015  . Low testosterone 07/25/2014  . Osteoarthritis of hip 07/07/2013  . Chronic pain of right knee 08/10/2008  . Eczema 02/16/2007    Carles Florea Rober MinionP Jerimah Witucki PT, MPH  07/30/2018, 9:22 AM  [facility] [address] [place], [place], [address] Phone: [phone]   Fax:  [phone]  Name: Earl [name]  MRN: [id number] Date of Birth: 10-23-1965

## 2018-08-02 DIAGNOSIS — G8929 Other chronic pain: Secondary | ICD-10-CM | POA: Diagnosis not present

## 2018-08-02 DIAGNOSIS — F3341 Major depressive disorder, recurrent, in partial remission: Secondary | ICD-10-CM | POA: Diagnosis not present

## 2018-08-03 ENCOUNTER — Encounter: Payer: BLUE CROSS/BLUE SHIELD | Admitting: Rehabilitative and Restorative Service Providers"

## 2018-08-04 DIAGNOSIS — E349 Endocrine disorder, unspecified: Secondary | ICD-10-CM | POA: Diagnosis not present

## 2018-08-06 ENCOUNTER — Ambulatory Visit (INDEPENDENT_AMBULATORY_CARE_PROVIDER_SITE_OTHER): Payer: BLUE CROSS/BLUE SHIELD | Admitting: Physical Therapy

## 2018-08-06 DIAGNOSIS — M25512 Pain in left shoulder: Secondary | ICD-10-CM

## 2018-08-06 DIAGNOSIS — R293 Abnormal posture: Secondary | ICD-10-CM

## 2018-08-06 DIAGNOSIS — E349 Endocrine disorder, unspecified: Secondary | ICD-10-CM | POA: Diagnosis not present

## 2018-08-06 DIAGNOSIS — R531 Weakness: Secondary | ICD-10-CM

## 2018-08-06 NOTE — Therapy (Signed)
[facility] [place] [address] [place], [place], [address] Phone: [phone]   Fax:  [phone]  Physical Therapy Treatment  Patient Details  Name: Earl [name] MRN: [id number] Date of Birth: 1965-12-04 Referring Provider (PT): Dr Casimiro Needle Lauffenburger/Kathryn Meiners PA-C    Encounter Date: 08/06/2018  PT End of Session - 08/06/18 1007    Visit Number  2    Number of Visits  12    Date for PT Re-Evaluation  09/10/18    Authorization Type  BCBS    PT Start Time  0845    PT Stop Time  0920   MHP first 10 min    PT Time Calculation (min)  35 min    Activity Tolerance  Patient tolerated treatment well;No increased pain    Behavior During Therapy  WFL for tasks assessed/performed       Past Medical History:  Diagnosis Date  . Arthritis   . Depression   . Hemorrhoids   . History of skin cancer in adulthood 2012   chest wall  . Hypertension   . Hypogonadism, male   . Insomnia     Past Surgical History:  Procedure Laterality Date  . APPENDECTOMY    . COLONOSCOPY    . HERNIA REPAIR    . JOINT REPLACEMENT    . OSTEOTOMY    . SHOULDER ARTHROSCOPY Left   . TOTAL HIP ARTHROPLASTY Right   . TOTAL KNEE ARTHROPLASTY Left     There were no vitals filed for this visit.  Subjective Assessment - 08/06/18 0910    Subjective  Pt arrives without sling, "I was told I could take it off as long as I keep it close to my body.".   Pt reports no new changes since last visit.     Patient Stated Goals  wants to return to full use of the Lt shoulder- gain ROM and strength in Lt UE     Currently in Pain?  Yes    Pain Score  3     Pain Location  Shoulder    Pain Orientation  Left    Pain Descriptors / Indicators  Aching    Aggravating Factors   morning time    Pain Relieving Factors  rest, hot shower          [facility] PT Assessment - 08/06/18 0001      Assessment   Medical Diagnosis  Lt RCR    Referring Provider (PT)  Dr  Casimiro Needle Lauffenburger/Kathryn Meiners PA-C     Onset Date/Surgical Date  07/20/18    Hand Dominance  Right    Next MD Visit  09/01/18    Prior Therapy  yes       PROM   Left Shoulder Flexion  130 Degrees    Left Shoulder External Rotation  35 Degrees   scapular plane [name] Adult PT Treatment/Exercise - 08/06/18 0001      Self-Care   Self-Care  Other Self-Care Comments    Other Self-Care Comments   Lengthy re-education regarding rehab protocol for shoulder, activities to avoid, importance of adherance of protocol to avoid re-injury.       Exercises   Exercises  Shoulder;Neck      Neck Exercises: Seated   Neck Retraction  5 reps;5 secs    Lateral Flexion  Right;Left;5 reps    Shoulder Rolls  Backwards;Forwards;5 reps      Shoulder Exercises: Supine   External  Rotation  AAROM;Left;10 reps   arm neutral, propped on pillow, cane, to 35 deg per protocol   Other Supine Exercises  scap retraction x 5 sec hold x 10 reps (Left arm propped on pillow to maintain neutral position)       Modalities   Modalities  --   pt declined ice/ vaso at end of session     Moist Heat Therapy   Number Minutes Moist Heat  10 Minutes    Moist Heat Location  Shoulder   at beginning of session per pt request     Manual Therapy   Manual Therapy  Passive ROM;Taping    Passive ROM  Lt shoulder flexion to 130 deg, ER to 35 deg (per protocol), scaption to 130 deg.     Kinesiotex  Edema      Kinesiotix   Edema  squid shaped piece of sensitive skin Rock tape applied to Lt anterior shoulder to bicep; small squid applied over bicep - all applied with 10% stretch to decrease bruising in area (incisions avoided).               PT Education - 08/06/18 1007    Education Details  reviewed rehab protocol and activities to avoid.     Person(s) Educated  Patient    Methods  Explanation    Comprehension  Verbalized understanding          PT Long Term Goals - 07/30/18 0914      PT LONG  TERM GOAL #1   Title  Progress shoulder rehab per protocol providing patient with education in activities/exercises allowed per protocol 09/10/18    Time  6    Period  Weeks    Status  New    Target Date  09/10/18      PT LONG TERM GOAL #2   Title  Increase ROM Lt shoulder to 130 deg flexion 35 deg ER per protocol 09/20/18    Baseline  -    Time  6    Period  Weeks    Status  New      PT LONG TERM GOAL #3   Title  progress with AROM/strengthening/increasing functional activities as indicated 09/10/18    Baseline  -    Time  6    Period  Weeks    Status  New      PT LONG TERM GOAL #4   Title  Independent in HEP 09/10/18    Time  6    Period  Weeks    Status  New      PT LONG TERM GOAL #5   Title  Improve FOTO to </= 37% limitation 09/10/18    Time  6    Period  Weeks    Status  New            Plan - 08/06/18 1008    Clinical Impression Statement  Pt arrived to appointment without sling on, reporting his surgeon told him he can go without it except when in crowded areas.  Pt educated at length regarding rehab protocol and importance of adherance to this to avoid re-injury of shoulder.  Despite this, pt was observed throughout treatment moving Lt shoulder into various motions including active flexion overhead and reaching arm back to put on jacket. Pt was encouraged to wear sling to avoid moving shoulder into unsafe ROM during this protective phase.Pt verbalized understanding.  Pt will benefit from continued PT intervention to maximize mobility and safety.  Rehab Potential  Good    PT Frequency  2x / week    PT Duration  6 weeks    PT Treatment/Interventions  Patient/family education;ADLs/Self Care Home Management;Cryotherapy;Electrical Stimulation;Iontophoresis 4mg /ml Dexamethasone;Moist Heat;Dry needling;Manual techniques;Neuromuscular re-education;Therapeutic activities;Therapeutic exercise    PT Next Visit Plan  Manual therapy to periscapular muscels; continue education  re importance of following protocol; progress shoulder rehab per protocol     PT Home Exercise Plan  Access Code: [email or web address]    Consulted and Agree with Plan of Care  Patient       Patient will benefit from skilled therapeutic intervention in order to improve the following deficits and impairments:  Postural dysfunction, Improper body mechanics, Pain, Increased fascial restricitons, Decreased mobility, Decreased range of motion, Decreased strength, Decreased activity tolerance  Visit Diagnosis: Acute pain of left shoulder  Weakness generalized  Abnormal posture     Problem List Patient Active Problem List   Diagnosis Date Noted  . TMJ tenderness, right 05/07/2018  . Strain of rotator cuff, left, initial encounter 05/07/2018  . History of arthroplasty of left knee 03/03/2018  . Hypertension goal BP (blood pressure) < 130/80 02/26/2018  . Generalized edema 02/26/2018  . Erectile dysfunction 02/26/2018  . Migraine without aura and without status migrainosus, not intractable 02/26/2018  . Elevated AST (SGOT) 02/26/2018  . Chronic midline low back pain without sciatica 01/06/2018  . Benign prostatic hyperplasia with urinary frequency 08/28/2017  . Recurrent major depressive disorder, in partial remission (HCC) 07/21/2017  . Methadone use disorder, mild, abuse (HCC) 04/29/2017  . Metatarsalgia of left foot 04/02/2017  . Insomnia 01/22/2017  . Loose total hip arthroplasty (HCC) 12/31/2016  . Mood disorder with depressive features due to medical condition 11/06/2015  . Post right SI joint fusion 11/06/2015  . Low testosterone 07/25/2014  . Osteoarthritis of hip 07/07/2013  . Chronic pain of right knee 08/10/2008  . Eczema 02/16/2007   Mayer Camel, PTA 08/06/18 10:16 AM  [facility] [place] [address] [place], [place], [address] Phone: [phone]   Fax:  [phone]  Name: Earl [name] MRN: [id number] Date of  Birth: 1965-12-15

## 2018-08-06 NOTE — Patient Instructions (Signed)

## 2018-08-10 ENCOUNTER — Ambulatory Visit (INDEPENDENT_AMBULATORY_CARE_PROVIDER_SITE_OTHER): Payer: BLUE CROSS/BLUE SHIELD | Admitting: Physical Therapy

## 2018-08-10 ENCOUNTER — Encounter: Payer: Self-pay | Admitting: Physical Therapy

## 2018-08-10 DIAGNOSIS — R293 Abnormal posture: Secondary | ICD-10-CM

## 2018-08-10 DIAGNOSIS — R531 Weakness: Secondary | ICD-10-CM

## 2018-08-10 DIAGNOSIS — M25512 Pain in left shoulder: Secondary | ICD-10-CM | POA: Diagnosis not present

## 2018-08-10 NOTE — Therapy (Addendum)
[facility] [address] [place], [place], [address] Phone: [phone]   Fax:  [phone]  Physical Therapy Treatment/Discharge  Patient Details  Name: Earl [name] MRN: [id number] Date of Birth: 02/13/1966 Referring Provider (PT): Dr Michael Lauffenburger/Kathryn Meiners PA-C    Encounter Date: 08/10/2018  PT End of Session - 08/10/18 0924    Visit Number  3    Number of Visits  12    Date for PT Re-Evaluation  09/10/18    Authorization Type  [employer]    PT Start Time  0845    PT Stop Time  0924    PT Time Calculation (min)  39 min    Activity Tolerance  Patient tolerated treatment well;No increased pain    Behavior During Therapy  WFL for tasks assessed/performed       Past Medical History:  Diagnosis Date  . Arthritis   . Depression   . Hemorrhoids   . History of skin cancer in adulthood 2012   chest wall  . Hypertension   . Hypogonadism, male   . Insomnia     Past Surgical History:  Procedure Laterality Date  . APPENDECTOMY    . COLONOSCOPY    . HERNIA REPAIR    . JOINT REPLACEMENT    . OSTEOTOMY    . SHOULDER ARTHROSCOPY Left   . TOTAL HIP ARTHROPLASTY Right   . TOTAL KNEE ARTHROPLASTY Left     There were no vitals filed for this visit.  Subjective Assessment - 08/10/18 0847    Subjective  no sling today. "this is the third shoulder surgery I've had."    Pertinent History  Injury to Lt shoulder 2011; surgery 12/17/17; 4 surgeries Lt foot; 6 Lt knee; 2 Rt leg; 2 Rt hip; SI joint fusion; hernia repair; appentectomy; Lt wirst x 1; Lt elbow x 3 Rt shoulder x 1; Arthritis; skin cancer; recent urinary tract issues with ED visit     Diagnostic tests  MRI     Patient Stated Goals  wants to return to full use of the Lt shoulder- gain ROM and strength in Lt UE                        [facility] Adult PT Treatment/Exercise - 08/10/18 0904      Shoulder Exercises: Supine   External Rotation   AAROM;Left;10 reps   arm neutral, propped on pillow, cane, to 35 deg per protocol   Other Supine Exercises  scap retraction x 5 sec hold x 10 reps (Left arm propped on pillow to maintain neutral position)       Shoulder Exercises: Seated   Other Seated Exercises  scapular depression 10 x 5 sec on green physioball      Manual Therapy   Manual Therapy  Passive ROM    Passive ROM  Lt shoulder flexion to 130 deg, ER to 35 deg (per protocol), scaption to 130 deg.              PT Education - 08/10/18 0925    Education Details  rehab protocol and precautions, PT to call MD office for clarification    Person(s) Educated  Patient    Methods  Explanation    Comprehension  Verbalized understanding          PT Long Term Goals - 07/30/18 0914      PT LONG TERM GOAL #1   Title  Progress shoulder rehab per   protocol providing patient with education in activities/exercises allowed per protocol 09/10/18    Time  6    Period  Weeks    Status  New    Target Date  09/10/18      PT LONG TERM GOAL #2   Title  Increase ROM Lt shoulder to 130 deg flexion 35 deg ER per protocol 09/20/18    Baseline  -    Time  6    Period  Weeks    Status  New      PT LONG TERM GOAL #3   Title  progress with AROM/strengthening/increasing functional activities as indicated 09/10/18    Baseline  -    Time  6    Period  Weeks    Status  New      PT LONG TERM GOAL #4   Title  Independent in HEP 09/10/18    Time  6    Period  Weeks    Status  New      PT LONG TERM GOAL #5   Title  Improve FOTO to </= 37% limitation 09/10/18    Time  6    Period  Weeks    Status  New            Plan - 08/10/18 1012    Clinical Impression Statement  Pt arrived today again stating "I'm not like most people, I heal faster and my doctor has told me it's okay to move my arm."  Re educated need to protect shoulder and that we must follow protocol provided by MD's office.  Pt continued to disagree with therapist, and PT  to call MD's office for clarification.  Pt at end of session more understanding to PT's concerns, but still continued to use LUE despite protocol recommendations.  Pt will continue to benefit from PT to maximize function.    Rehab Potential  Good    PT Frequency  2x / week    PT Duration  6 weeks    PT Treatment/Interventions  Patient/family education;ADLs/Self Care Home Management;Cryotherapy;Electrical Stimulation;Iontophoresis 4mg/ml Dexamethasone;Moist Heat;Dry needling;Manual techniques;Neuromuscular re-education;Therapeutic activities;Therapeutic exercise    PT Next Visit Plan  Manual therapy to periscapular muscels; continue education re importance of following protocol; progress shoulder rehab per protocol     PT Home Exercise Plan  Access Code: [email or web address]    Consulted and Agree with Plan of Care  Patient       Patient will benefit from skilled therapeutic intervention in order to improve the following deficits and impairments:  Postural dysfunction, Improper body mechanics, Pain, Increased fascial restricitons, Decreased mobility, Decreased range of motion, Decreased strength, Decreased activity tolerance  Visit Diagnosis: Acute pain of left shoulder  Weakness generalized  Abnormal posture     Problem List Patient Active Problem List   Diagnosis Date Noted  . TMJ tenderness, right 05/07/2018  . Strain of rotator cuff, left, initial encounter 05/07/2018  . History of arthroplasty of left knee 03/03/2018  . Hypertension goal BP (blood pressure) < 130/80 02/26/2018  . Generalized edema 02/26/2018  . Erectile dysfunction 02/26/2018  . Migraine without aura and without status migrainosus, not intractable 02/26/2018  . Elevated AST (SGOT) 02/26/2018  . Chronic midline low back pain without sciatica 01/06/2018  . Benign prostatic hyperplasia with urinary frequency 08/28/2017  . Recurrent major depressive disorder, in partial remission (HCC) 07/21/2017  . Methadone use disorder,  mild, abuse (HCC) 04/29/2017  . Metatarsalgia of left foot 04/02/2017  . Insomnia 01/22/2017  .   Loose total hip arthroplasty ([place]) 12/31/2016  . Mood disorder with depressive features due to medical condition 11/06/2015  . Post right SI joint fusion 11/06/2015  . Low testosterone 07/25/2014  . Osteoarthritis of hip 07/07/2013  . Chronic pain of right knee 08/10/2008  . Eczema 02/16/2007      Laureen Abrahams, PT, DPT 08/10/18 10:17 AM     [facility] [address] [place], [place], [address] Phone: [phone]   Fax:  [phone]  Name: Earl [name] MRN: [id number] Date of Birth: 1965/11/30    PHYSICAL THERAPY DISCHARGE SUMMARY  Visits from Start of Care: 3  Current functional level related to goals / functional outcomes: See above   Remaining deficits: See above   Education / Equipment: HEP, compliance with protocol  Plan: Patient agrees to discharge.  Patient goals were not met. Patient is being discharged due to the patient's request.  ?????    Laureen Abrahams, PT, DPT 08/13/18 9:23 AM  [facility] [place] [address] [place], [place] [address]  [phone] (office) [phone] (fax)

## 2018-08-11 DIAGNOSIS — N400 Enlarged prostate without lower urinary tract symptoms: Secondary | ICD-10-CM | POA: Diagnosis not present

## 2018-08-12 ENCOUNTER — Encounter: Payer: BLUE CROSS/BLUE SHIELD | Admitting: Physical Therapy

## 2018-08-12 ENCOUNTER — Encounter: Payer: Self-pay | Admitting: Physician Assistant

## 2018-08-12 DIAGNOSIS — T839XXS Unspecified complication of genitourinary prosthetic device, implant and graft, sequela: Secondary | ICD-10-CM

## 2018-08-12 DIAGNOSIS — R300 Dysuria: Secondary | ICD-10-CM

## 2018-08-12 MED ORDER — SULFAMETHOXAZOLE-TRIMETHOPRIM 800-160 MG PO TABS: 1.0000 | ORAL_TABLET | Freq: Two times a day (BID) | ORAL | 0 refills | Status: AC

## 2018-08-13 ENCOUNTER — Telehealth: Payer: Self-pay | Admitting: Physical Therapy

## 2018-08-13 NOTE — Telephone Encounter (Signed)
Called Dr. Cecille AmsterdamLaffenburger's office for clarification on protocol, as pt reporting that MD and PA gave pt clearance to no long wear sling "except in busy places so no one knocks it." Pt also stating that MD's office cleared him for AROM because he's "progressing so much faster than most people." Pt expressed frustration with PT following protocol and not advancing sooner, so therefore phone call was made to clarify.  Discussed with surgeon's office, and clarified that he is only allowed to have the sling off in the home, and absolutely NO active abduction, internal rotation or extension.  He was allowed to perform gentle PROM and AAROM for forward flexion to 90 degrees only, and allowed to start table slides.  Otherwise, PT to follow protocol.    Pt then canceled all remaining PT appointments at this clinic.

## 2018-08-15 LAB — URINALYSIS, ROUTINE W REFLEX MICROSCOPIC
BILIRUBIN URINE: NEGATIVE
Bacteria, UA: NONE SEEN /HPF
Glucose, UA: NEGATIVE
HGB URINE DIPSTICK: NEGATIVE
Hyaline Cast: NONE SEEN /LPF
Ketones, ur: NEGATIVE
NITRITE: NEGATIVE
PROTEIN: NEGATIVE
RBC / HPF: NONE SEEN /HPF (ref 0–2)
Specific Gravity, Urine: 1.019 (ref 1.001–1.03)
Squamous Epithelial / LPF: NONE SEEN /HPF (ref <=5)
pH: 7 (ref 5.0–8.0)

## 2018-08-15 LAB — URINE CULTURE
MICRO NUMBER:: 91523377
SPECIMEN QUALITY: ADEQUATE

## 2018-08-16 ENCOUNTER — Encounter: Payer: BLUE CROSS/BLUE SHIELD | Admitting: Physical Therapy

## 2018-08-19 DIAGNOSIS — F3341 Major depressive disorder, recurrent, in partial remission: Secondary | ICD-10-CM | POA: Diagnosis not present

## 2018-08-20 ENCOUNTER — Encounter: Payer: BLUE CROSS/BLUE SHIELD | Admitting: Rehabilitative and Restorative Service Providers"

## 2018-10-05 ENCOUNTER — Ambulatory Visit: Payer: BLUE CROSS/BLUE SHIELD | Admitting: Sports Medicine

## 2018-10-05 ENCOUNTER — Ambulatory Visit (INDEPENDENT_AMBULATORY_CARE_PROVIDER_SITE_OTHER): Payer: BLUE CROSS/BLUE SHIELD

## 2018-10-05 DIAGNOSIS — M25512 Pain in left shoulder: Secondary | ICD-10-CM | POA: Diagnosis not present

## 2018-10-05 DIAGNOSIS — S46012A Strain of muscle(s) and tendon(s) of the rotator cuff of left shoulder, initial encounter: Secondary | ICD-10-CM | POA: Diagnosis not present

## 2018-10-05 DIAGNOSIS — S4992XA Unspecified injury of left shoulder and upper arm, initial encounter: Secondary | ICD-10-CM | POA: Diagnosis not present

## 2018-10-05 MED ORDER — IBUPROFEN 800 MG PO TABS: 800.0000 mg | ORAL_TABLET | Freq: Three times a day (TID) | ORAL | 2 refills | Status: DC | PRN

## 2018-10-05 NOTE — Assessment & Plan Note (Addendum)
History of rotator cuff repair. Recent trauma. X-rays, we are probably come to do a diagnostic ultrasound. X-rays are unremarkable for fractures, there does appear to be some disruption of the infraspinatus at the insertion. I do not see any full-thickness retracted tears, he will wear a sling for 2 weeks, ibuprofen 800 mg 3 times daily. Return to see me in 2 weeks, MRI if no better.

## 2018-10-05 NOTE — Progress Notes (Signed)
Subjective:    CC: Left shoulder injury  HPI: This is a pleasant 53 year old male, he has a history of a left shoulder rotator cuff repair, he was doing well until his son came down on his shoulder while playing basketball.  He had immediate pain, and inability to lift the shoulder.  It is severe, persistent, localized without radiation.  I reviewed the past medical history, family history, social history, surgical history, and allergies today and no changes were needed.  Please see the problem list section below in epic for further details.  Past Medical History: Past Medical History:  Diagnosis Date  . Arthritis   . Depression   . Hemorrhoids   . History of skin cancer in adulthood 2012   chest wall  . Hypertension   . Hypogonadism, male   . Insomnia    Past Surgical History: Past Surgical History:  Procedure Laterality Date  . APPENDECTOMY    . COLONOSCOPY    . HERNIA REPAIR    . JOINT REPLACEMENT    . OSTEOTOMY    . SHOULDER ARTHROSCOPY Left   . TOTAL HIP ARTHROPLASTY Right   . TOTAL KNEE ARTHROPLASTY Left    Social History: Social History   Socioeconomic History  . Marital status: Divorced    Spouse name: Not on file  . Number of children: Not on file  . Years of education: Not on file  . Highest education level: Not on file  Occupational History  . Not on file  Social Needs  . Financial resource strain: Not on file  . Food insecurity:    Worry: Not on file    Inability: Not on file  . Transportation needs:    Medical: Not on file    Non-medical: Not on file  Tobacco Use  . Smoking status: Never Smoker  . Smokeless tobacco: Never Used  Substance and Sexual Activity  . Alcohol use: Yes  . Drug use: Never  . Sexual activity: Yes  Lifestyle  . Physical activity:    Days per week: Not on file    Minutes per session: Not on file  . Stress: Not on file  Relationships  . Social connections:    Talks on phone: Not on file    Gets together: Not on file     Attends religious service: Not on file    Active member of club or organization: Not on file    Attends meetings of clubs or organizations: Not on file    Relationship status: Not on file  Other Topics Concern  . Not on file  Social History Narrative  . Not on file   Family History: Family History  Problem Relation Age of Onset  . Stroke Mother   . Skin cancer Father   . Aneurysm Brother   . Hypertension Sister   . Heart disease Sister   . Cancer Neg Hx   . Heart attack Neg Hx    Allergies: Allergies  Allergen Reactions  . Iodinated Diagnostic Agents Swelling    N/V - rash- red skin   . Morphine And Related Other (See Comments)    Irritability    Medications: See med rec.  Review of Systems: No fevers, chills, night sweats, weight loss, chest pain, or shortness of breath.   Objective:    General: Well Developed, well nourished, and in no acute distress.  Neuro: Alert and oriented x3, extra-ocular muscles intact, sensation grossly intact.  HEENT: Normocephalic, atraumatic, pupils equal round reactive to light,  neck supple, no masses, no lymphadenopathy, thyroid nonpalpable.  Skin: Warm and dry, no rashes. Cardiac: Regular rate and rhythm, no murmurs rubs or gallops, no lower extremity edema.  Respiratory: Clear to auscultation bilaterally. Not using accessory muscles, speaking in full sentences. Left shoulder: Inspection reveals no abnormalities, atrophy or asymmetry. Palpation is normal with no tenderness over AC joint or bicipital groove. ROM is full in all planes. Rotator cuff strength weak to external rotation. No signs of impingement with negative Neer and Hawkin's tests, empty can. Speeds test mildly positive.  Negative Yergason sign. No labral pathology noted with negative Obrien's, negative crank, negative clunk, and good stability. Normal scapular function observed. No painful arc and no drop arm sign. No apprehension sign  X-rays  unremarkable  Procedure: Diagnostic Ultrasound of left shoulder Device: GE Logiq E  Findings: Possible partial width, partial-thickness infraspinatus tear/tendinopathy Images permanently stored and available for review in the ultrasound unit.  Impression: Possible partial width, partial-thickness infraspinatus tear/tendinopathy  Impression and Recommendations:    Strain of rotator cuff, left, initial encounter History of rotator cuff repair. Recent trauma. X-rays, we are probably come to do a diagnostic ultrasound. X-rays are unremarkable for fractures, there does appear to be some disruption of the infraspinatus at the insertion. I do not see any full-thickness retracted tears, he will wear a sling for 2 weeks, ibuprofen 800 mg 3 times daily. Return to see me in 2 weeks, MRI if no better.  ___________________________________________ Ihor Austin. Benjamin Stain, M.D., ABFM., CAQSM. Primary Care and Sports Medicine [facility]  Adjunct Professor of Family Medicine  [employer]

## 2018-10-20 ENCOUNTER — Other Ambulatory Visit: Payer: Self-pay | Admitting: Physician Assistant

## 2018-10-20 DIAGNOSIS — F5104 Psychophysiologic insomnia: Secondary | ICD-10-CM

## 2018-10-22 ENCOUNTER — Ambulatory Visit: Payer: BLUE CROSS/BLUE SHIELD | Admitting: Sports Medicine

## 2018-10-22 ENCOUNTER — Ambulatory Visit (INDEPENDENT_AMBULATORY_CARE_PROVIDER_SITE_OTHER): Payer: BLUE CROSS/BLUE SHIELD | Admitting: Physician Assistant

## 2018-10-22 ENCOUNTER — Encounter: Payer: Self-pay | Admitting: Physician Assistant

## 2018-10-22 VITALS — BP 129/74 | HR 75 | Ht 68.0 in | Wt 189.0 lb

## 2018-10-22 DIAGNOSIS — I10 Essential (primary) hypertension: Secondary | ICD-10-CM | POA: Diagnosis not present

## 2018-10-22 DIAGNOSIS — D696 Thrombocytopenia, unspecified: Secondary | ICD-10-CM | POA: Diagnosis not present

## 2018-10-22 DIAGNOSIS — Z1389 Encounter for screening for other disorder: Secondary | ICD-10-CM

## 2018-10-22 DIAGNOSIS — F5104 Psychophysiologic insomnia: Secondary | ICD-10-CM | POA: Diagnosis not present

## 2018-10-22 DIAGNOSIS — R682 Dry mouth, unspecified: Secondary | ICD-10-CM

## 2018-10-22 DIAGNOSIS — Z13220 Encounter for screening for lipoid disorders: Secondary | ICD-10-CM

## 2018-10-22 DIAGNOSIS — R631 Polydipsia: Secondary | ICD-10-CM

## 2018-10-22 DIAGNOSIS — Z0000 Encounter for general adult medical examination without abnormal findings: Secondary | ICD-10-CM

## 2018-10-22 MED ORDER — ESZOPICLONE 3 MG PO TABS: 3.0000 mg | ORAL_TABLET | Freq: Every evening | ORAL | 0 refills | Status: DC | PRN

## 2018-10-22 NOTE — Patient Instructions (Signed)
Preventive Care 40-64 Years, Male Preventive care refers to lifestyle choices and visits with your health care provider that can promote health and wellness. What does preventive care include?   A yearly physical exam. This is also called an annual well check.  Dental exams once or twice a year.  Routine eye exams. Ask your health care provider how often you should have your eyes checked.  Personal lifestyle choices, including: ? Daily care of your teeth and gums. ? Regular physical activity. ? Eating a healthy diet. ? Avoiding tobacco and drug use. ? Limiting alcohol use. ? Practicing safe sex. ? Taking low-dose aspirin every day starting at age 50. What happens during an annual well check? The services and screenings done by your health care provider during your annual well check will depend on your age, overall health, lifestyle risk factors, and family history of disease. Counseling Your health care provider may ask you questions about your:  Alcohol use.  Tobacco use.  Drug use.  Emotional well-being.  Home and relationship well-being.  Sexual activity.  Eating habits.  Work and work environment. Screening You may have the following tests or measurements:  Height, weight, and BMI.  Blood pressure.  Lipid and cholesterol levels. These may be checked every 5 years, or more frequently if you are over 50 years old.  Skin check.  Lung cancer screening. You may have this screening every year starting at age 55 if you have a 30-pack-year history of smoking and currently smoke or have quit within the past 15 years.  Colorectal cancer screening. All adults should have this screening starting at age 50 and continuing until age 75. Your health care provider may recommend screening at age 45. You will have tests every 1-10 years, depending on your results and the type of screening test. People at increased risk should start screening at an earlier age. Screening tests may  include: ? Guaiac-based fecal occult blood testing. ? Fecal immunochemical test (FIT). ? Stool DNA test. ? Virtual colonoscopy. ? Sigmoidoscopy. During this test, a flexible tube with a tiny camera (sigmoidoscope) is used to examine your rectum and lower colon. The sigmoidoscope is inserted through your anus into your rectum and lower colon. ? Colonoscopy. During this test, a long, thin, flexible tube with a tiny camera (colonoscope) is used to examine your entire colon and rectum.  Prostate cancer screening. Recommendations will vary depending on your family history and other risks.  Hepatitis C blood test.  Hepatitis B blood test.  Sexually transmitted disease (STD) testing.  Diabetes screening. This is done by checking your blood sugar (glucose) after you have not eaten for a while (fasting). You may have this done every 1-3 years. Discuss your test results, treatment options, and if necessary, the need for more tests with your health care provider. Vaccines Your health care provider may recommend certain vaccines, such as:  Influenza vaccine. This is recommended every year.  Tetanus, diphtheria, and acellular pertussis (Tdap, Td) vaccine. You may need a Td booster every 10 years.  Varicella vaccine. You may need this if you have not been vaccinated.  Zoster vaccine. You may need this after age 60.  Measles, mumps, and rubella (MMR) vaccine. You may need at least one dose of MMR if you were born in 1957 or later. You may also need a second dose.  Pneumococcal 13-valent conjugate (PCV13) vaccine. You may need this if you have certain conditions and have not been vaccinated.  Pneumococcal polysaccharide (PPSV23) vaccine.   You may need one or two doses if you smoke cigarettes or if you have certain conditions.  Meningococcal vaccine. You may need this if you have certain conditions.  Hepatitis A vaccine. You may need this if you have certain conditions or if you travel or work in  places where you may be exposed to hepatitis A.  Hepatitis B vaccine. You may need this if you have certain conditions or if you travel or work in places where you may be exposed to hepatitis B.  Haemophilus influenzae type b (Hib) vaccine. You may need this if you have certain risk factors. Talk to your health care provider about which screenings and vaccines you need and how often you need them. This information is not intended to replace advice given to you by your health care provider. Make sure you discuss any questions you have with your health care provider. Document Released: 09/07/2015 Document Revised: 10/01/2017 Document Reviewed: 06/12/2015 Elsevier Interactive Patient Education  2019 [employer]  

## 2018-10-22 NOTE — Progress Notes (Signed)
HPI:                                                                Earl [name] is a 53 y.o. male who presents to [name]: [facility] Care Sports Medicine today to for annual physical exam  Current Concerns include:   Earl [name] continues to endorse swelling in his extremities and is experiencing a new symptom of increased thirst and dry mouth.  Feels like he cannot drink enough water to get hydrated.  Health Habits  Diet: "good"  Exercise: limited due to left shoulder injury  Health Maintenance Health Maintenance  Topic Date Due  . HIV Screening  03/05/1980  . TETANUS/TDAP  09/17/2026  . COLONOSCOPY  07/16/2027  . INFLUENZA VACCINE  Completed    Patient Care Team: Riki Rusk as PCP - General (Physician Assistant) Betsey Amen, MD as Consulting Physician (Endocrinology) Cephus Shelling, MD as Consulting Physician (Gastroenterology) Monica Becton, MD as Consulting Physician (Sports Medicine) Clarisse Gouge, MD as Consulting Physician (Dermatopathology) Molly Maduro, MD as Consulting Physician (Orthopedic Surgery) Estill Bamberg, MD as Consulting Physician (Orthopedic Surgery) Gean Birchwood, MD as Consulting Physician (Orthopedic Surgery)   Depression screen [facility] 2/9 10/22/2018 02/24/2018  Decreased Interest 1 0  Down, Depressed, Hopeless 1 0  PHQ - 2 Score 2 0  Altered sleeping 2 3  Tired, decreased energy - 0  Change in appetite 1 0  Feeling bad or failure about yourself  0 0  Trouble concentrating 0 0  Moving slowly or fidgety/restless 0 0  Suicidal thoughts 0 0  PHQ-9 Score 5 3  Difficult doing work/chores Not difficult at all -    Past Medical History:  Diagnosis Date  . Acute urinary retention   . Arthritis   . Depression   . Hemorrhoids   . History of skin cancer in adulthood 2012   chest wall  . Hypertension   . Hypogonadism, male   . Insomnia    Past Surgical History:  Procedure Laterality  Date  . APPENDECTOMY    . COLONOSCOPY    . HERNIA REPAIR    . JOINT REPLACEMENT    . OSTEOTOMY    . SHOULDER ARTHROSCOPY Left 06/2018  . TOTAL HIP ARTHROPLASTY Right   . TOTAL KNEE ARTHROPLASTY Left    Social History   Tobacco Use  . Smoking status: Never Smoker  . Smokeless tobacco: Never Used  Substance Use Topics  . Alcohol use: Not Currently   family history includes Aneurysm in his brother; Heart disease in his father and sister; Hypertension in his father and sister; Skin cancer in his father; Stroke in his mother.  Review of Systems  HENT:       + dry mouth  Cardiovascular: Positive for leg swelling.  Genitourinary: Negative.   Musculoskeletal: Positive for back pain and joint pain (shoulder).  Neurological: Positive for headaches.  Endo/Heme/Allergies: Positive for polydipsia.       + hypogonadism   Psychiatric/Behavioral: The patient has insomnia.   All other systems reviewed and are negative.    Medications: Current Outpatient Medications  Medication Sig Dispense Refill  . cyclobenzaprine (FLEXERIL) 5 MG tablet Take 1 tablet (5 mg total) by mouth at bedtime as needed for muscle spasms.  30 tablet 1  . Eszopiclone (ESZOPICLONE) 3 MG TABS Take 1 tablet (3 mg total) by mouth at bedtime as needed. 30 tablet 0  . ibuprofen (ADVIL,MOTRIN) 800 MG tablet Take 1 tablet (800 mg total) by mouth every 8 (eight) hours as needed. 90 tablet 2  . Multiple Vitamins-Minerals (MULTIVITAL PO) Take by mouth.    . sildenafil (REVATIO) 20 MG tablet 1-5 tab PO 15 minutes prior to sexual activity as needed 50 tablet 5  . SUMAtriptan (IMITREX) 50 MG tablet Take 1 tablet (50 mg total) by mouth once as needed for up to 1 dose for migraine. May repeat in 2 hours if headache persists or recurs. 9 tablet 1  . testosterone cypionate (DEPOTESTOSTERONE CYPIONATE) 200 MG/ML injection Inject into the muscle.     No current facility-administered medications for this visit.    Allergies  Allergen  Reactions  . Iodinated Diagnostic Agents Swelling    N/V - rash- red skin   . Morphine And Related Other (See Comments)    Irritability   . Temazepam Other (See Comments)    Mood change, irritability       Objective:  BP 129/74   Pulse 75   Ht 5\' 8"  (1.727 m)   Wt 189 lb (85.7 kg)   SpO2 98%   BMI 28.74 kg/m  General Appearance:  Alert, cooperative, no distress, appropriate for age, athletic build                            Head:  Normocephalic, without obvious abnormality                             Eyes:  PERRL, EOM's intact, conjunctiva and cornea clear                             Ears:  TM pearly gray color and semitransparent, external ear canals normal, both ears                            Nose:  Nares symmetrical, mucosa pink                          Throat:  Lips, tongue, and mucosa are somewhat pale and dry; no lesions; oropharynx clear, uvula midline; good dentition                             Neck:  Supple; symmetrical, trachea midline, no adenopathy; thyroid: no enlargement, symmetric, no tenderness/mass/nodules                             Back:  Symmetrical, no curvature, ROM normal                                        Lungs:  Clear to auscultation bilaterally, respirations unlabored                             Heart:  normal rate & regular rhythm, S1 and S2 normal, no murmurs, rubs, or gallops  Abdomen:  Soft, non-tender, no mass or organomegaly              Genitourinary:  deferred         Musculoskeletal:  Normal tone, normal gait and station, no peripheral edema                     Lymphatic:  No adenopathy             Skin/Hair/Nails:  Skin warm, dry and intact, no rashes or abnormal dyspigmentation on limited exam                   Neurologic:  Alert and oriented x3, no cranial nerve deficits, sensation grossly intact, normal gait and station, no tremor Psych: well-groomed, cooperative, good eye contact, euthymic mood, affect  mood-congruent, speech is articulate, and thought processes clear and goal-directed  No results found for this or any previous visit (from the past 72 hour(s)). No results found.    Assessment and Plan: 53 y.o. male with   .Earl CobbKent was seen today for annual exam.  Diagnoses and all orders for this visit:  Encounter for annual physical exam -     CBC -     COMPLETE METABOLIC PANEL WITH GFR -     Lipid Panel w/reflex Direct LDL -     Urinalysis, Routine w reflex microscopic  Hypertension goal BP (blood pressure) < 130/80 -     COMPLETE METABOLIC PANEL WITH GFR -     Urinalysis, Routine w reflex microscopic  Chronic insomnia -     Eszopiclone (ESZOPICLONE) 3 MG TABS; Take 1 tablet (3 mg total) by mouth at bedtime as needed.  Thrombocytopenia (HCC) -     CBC  Screening for lipid disorders -     Lipid Panel w/reflex Direct LDL  Screening for blood or protein in urine -     Urinalysis, Routine w reflex microscopic  Polydipsia -     Urinalysis, Routine w reflex microscopic -     Osmolality, urine -     Osmolality  - Personally reviewed PMH, PSH, PFH, medications, allergies, HM - Age-appropriate cancer screening: Colonoscopy UTD, Q10y - Influenza UTD - Tdap UTD - PHQ2=2 - Routine fasting labs pending   Polydipsia No peripheral edema on exam We will do diabetes insipidus work-up with serum and urine osmolality and UA with microscopic  Patient education and anticipatory guidance given Patient agrees with treatment plan Follow-up in 6 months for med mgmt or sooner as needed  Levonne Hubertharley E. Cummings PA-C

## 2018-10-28 DIAGNOSIS — R631 Polydipsia: Secondary | ICD-10-CM | POA: Insufficient documentation

## 2018-10-28 DIAGNOSIS — D696 Thrombocytopenia, unspecified: Secondary | ICD-10-CM | POA: Insufficient documentation

## 2018-10-28 DIAGNOSIS — R682 Dry mouth, unspecified: Secondary | ICD-10-CM | POA: Insufficient documentation

## 2018-11-01 ENCOUNTER — Encounter: Payer: Self-pay | Admitting: Physician Assistant

## 2018-11-02 LAB — COMPLETE METABOLIC PANEL WITH GFR
AG Ratio: 2.1 (calc) (ref 1.0–2.5)
ALT: 29 U/L (ref 9–46)
AST: 33 U/L (ref 10–35)
Albumin: 4.7 g/dL (ref 3.6–5.1)
Alkaline phosphatase (APISO): 85 U/L (ref 35–144)
BUN: 16 mg/dL (ref 7–25)
CALCIUM: 9.3 mg/dL (ref 8.6–10.3)
CO2: 27 mmol/L (ref 20–32)
Chloride: 104 mmol/L (ref 98–110)
Creat: 0.98 mg/dL (ref 0.70–1.33)
GFR, EST NON AFRICAN AMERICAN: 88 mL/min/{1.73_m2} (ref >=60)
GFR, Est African American: 102 mL/min/{1.73_m2} (ref >=60)
Globulin: 2.2 g/dL (calc) (ref 1.9–3.7)
Glucose, Bld: 82 mg/dL (ref 65–99)
Potassium: 4.3 mmol/L (ref 3.5–5.3)
Sodium: 141 mmol/L (ref 135–146)
Total Bilirubin: 0.5 mg/dL (ref 0.2–1.2)
Total Protein: 6.9 g/dL (ref 6.1–8.1)

## 2018-11-02 LAB — CBC
HCT: 48.7 % (ref 38.5–50.0)
Hemoglobin: 17.6 g/dL — ABNORMAL HIGH (ref 13.2–17.1)
MCH: 31.8 pg (ref 27.0–33.0)
MCHC: 36.1 g/dL — ABNORMAL HIGH (ref 32.0–36.0)
MCV: 88.1 fL (ref 80.0–100.0)
MPV: 11.2 fL (ref 7.5–12.5)
PLATELETS: 165 10*3/uL (ref 140–400)
RBC: 5.53 10*6/uL (ref 4.20–5.80)
RDW: 12.5 % (ref 11.0–15.0)
WBC: 6 10*3/uL (ref 3.8–10.8)

## 2018-11-02 LAB — LIPID PANEL W/REFLEX DIRECT LDL
Cholesterol: 136 mg/dL (ref <200)
HDL: 39 mg/dL — ABNORMAL LOW (ref >=40)
LDL Cholesterol (Calc): 78 mg/dL (calc)
Non-HDL Cholesterol (Calc): 97 mg/dL (calc) (ref <130)
Total CHOL/HDL Ratio: 3.5 (calc) (ref <5.0)
Triglycerides: 111 mg/dL (ref <150)

## 2018-11-02 LAB — OSMOLALITY: Osmolality: 297 mOsm/kg (ref 278–305)

## 2018-11-04 LAB — URINALYSIS, ROUTINE W REFLEX MICROSCOPIC
Bilirubin Urine: NEGATIVE
Glucose, UA: NEGATIVE
Hgb urine dipstick: NEGATIVE
Ketones, ur: NEGATIVE
Leukocytes,Ua: NEGATIVE
NITRITE: NEGATIVE
PROTEIN: NEGATIVE
Specific Gravity, Urine: 1.015 (ref 1.001–1.03)
pH: 6 (ref 5.0–8.0)

## 2018-11-04 LAB — OSMOLALITY, URINE: Osmolality, Ur: 419 mOsm/kg (ref 50–1200)

## 2018-11-16 ENCOUNTER — Encounter: Payer: Self-pay | Admitting: Physician Assistant

## 2018-11-16 ENCOUNTER — Other Ambulatory Visit: Payer: Self-pay

## 2018-11-16 DIAGNOSIS — F5104 Psychophysiologic insomnia: Secondary | ICD-10-CM

## 2018-11-16 MED ORDER — ESZOPICLONE 3 MG PO TABS: 3.0000 mg | ORAL_TABLET | Freq: Every evening | ORAL | 0 refills | Status: DC | PRN

## 2018-11-19 ENCOUNTER — Other Ambulatory Visit: Payer: Self-pay

## 2018-11-19 ENCOUNTER — Encounter: Payer: Self-pay | Admitting: Sports Medicine

## 2018-11-19 ENCOUNTER — Ambulatory Visit: Payer: BLUE CROSS/BLUE SHIELD | Admitting: Sports Medicine

## 2018-11-19 DIAGNOSIS — L723 Sebaceous cyst: Secondary | ICD-10-CM | POA: Diagnosis not present

## 2018-11-19 DIAGNOSIS — M7742 Metatarsalgia, left foot: Secondary | ICD-10-CM | POA: Diagnosis not present

## 2018-11-19 DIAGNOSIS — Z9889 Other specified postprocedural states: Secondary | ICD-10-CM | POA: Diagnosis not present

## 2018-11-19 MED ORDER — DOXYCYCLINE HYCLATE 100 MG PO TABS: 100.0000 mg | ORAL_TABLET | Freq: Two times a day (BID) | ORAL | 0 refills | Status: AC

## 2018-11-19 NOTE — Assessment & Plan Note (Signed)
Cuff repair, reinjured, biceps and impingement symptoms. Need new MRI.

## 2018-11-19 NOTE — Assessment & Plan Note (Signed)
3/4 intermetatarsal bursa injection today. Return in a month for this.

## 2018-11-19 NOTE — Assessment & Plan Note (Signed)
Doxycycline, return on Tuesday or Wednesday for excision if not doing better.

## 2018-11-19 NOTE — Progress Notes (Signed)
Subjective:    CC: Multiple issues  HPI: Lump on neck: Left side under the chin, minimally tender, present for a few days now.  Left shoulder pain: Rotator cuff repair last fall, his son jumped on his shoulder, and now he has severe pain, weakness to abduction and flexion.  Pain: Moderate, persistent, localized between the third and fourth metatarsal heads, did well with an injection last year some time.  Pain is localized without radiation, worse with weightbearing.  I reviewed the past medical history, family history, social history, surgical history, and allergies today and no changes were needed.  Please see the problem list section below in epic for further details.  Past Medical History: Past Medical History:  Diagnosis Date  . Acute urinary retention   . Arthritis   . Depression   . Hemorrhoids   . History of skin cancer in adulthood 2012   chest wall  . Hypertension   . Hypogonadism, male   . Insomnia    Past Surgical History: Past Surgical History:  Procedure Laterality Date  . APPENDECTOMY    . COLONOSCOPY    . HERNIA REPAIR    . JOINT REPLACEMENT    . OSTEOTOMY    . SHOULDER ARTHROSCOPY Left 06/2018  . TOTAL HIP ARTHROPLASTY Right   . TOTAL KNEE ARTHROPLASTY Left    Social History: Social History   Socioeconomic History  . Marital status: Divorced    Spouse name: Not on file  . Number of children: Not on file  . Years of education: Not on file  . Highest education level: Not on file  Occupational History  . Not on file  Social Needs  . Financial resource strain: Not on file  . Food insecurity:    Worry: Not on file    Inability: Not on file  . Transportation needs:    Medical: Not on file    Non-medical: Not on file  Tobacco Use  . Smoking status: Never Smoker  . Smokeless tobacco: Never Used  Substance and Sexual Activity  . Alcohol use: Not Currently  . Drug use: Never  . Sexual activity: Yes    Birth control/protection: None  Lifestyle   . Physical activity:    Days per week: Not on file    Minutes per session: Not on file  . Stress: Not on file  Relationships  . Social connections:    Talks on phone: Not on file    Gets together: Not on file    Attends religious service: Not on file    Active member of club or organization: Not on file    Attends meetings of clubs or organizations: Not on file    Relationship status: Not on file  Other Topics Concern  . Not on file  Social History Narrative  . Not on file   Family History: Family History  Problem Relation Age of Onset  . Stroke Mother   . Skin cancer Father   . Heart disease Father   . Hypertension Father   . Aneurysm Brother   . Hypertension Sister   . Heart disease Sister   . Cancer Neg Hx   . Heart attack Neg Hx    Allergies: Allergies  Allergen Reactions  . Iodinated Diagnostic Agents Swelling    N/V - rash- red skin   . Morphine And Related Other (See Comments)    Irritability   . Temazepam Other (See Comments)    Mood change, irritability   Medications: See med  rec.  Review of Systems: No fevers, chills, night sweats, weight loss, chest pain, or shortness of breath.   Objective:    General: Well Developed, well nourished, and in no acute distress.  Neuro: Alert and oriented x3, extra-ocular muscles intact, sensation grossly intact.  HEENT: Normocephalic, atraumatic, pupils equal round reactive to light, neck supple, there is a 1 cm well-defined, slightly tender mass under the chin slightly to the left of the midline, no lymphadenopathy, thyroid nonpalpable.  Oropharynx, nasopharynx, ear canals unremarkable. Skin: Warm and dry, no rashes. Cardiac: Regular rate and rhythm, no murmurs rubs or gallops, no lower extremity edema.  Respiratory: Clear to auscultation bilaterally. Not using accessory muscles, speaking in full sentences. Left shoulder: Inspection reveals no abnormalities, atrophy or asymmetry. Palpation is normal with no  tenderness over AC joint or bicipital groove. ROM is full in all planes. Rotator cuff strength weak to abduction Positive Neer and Hawkin's tests, empty can. Speeds and Yergason's tests positive No labral pathology noted with negative Obrien's, negative crank, negative clunk, and good stability. Normal scapular function observed. No painful arc and no drop arm sign. No apprehension sign Left foot: No visible erythema or swelling. Range of motion is full in all directions. Strength is 5/5 in all directions. No hallux valgus. No pes cavus or pes planus. No abnormal callus noted. No pain over the navicular prominence, or base of fifth metatarsal. No tenderness to palpation of the calcaneal insertion of plantar fascia. No pain at the Achilles insertion. No pain over the calcaneal bursa. No pain of the retrocalcaneal bursa. Exquisitely tender between the third and fourth metatarsal heads No hallux rigidus or limitus. No tenderness palpation over interphalangeal joints. No pain with compression of the metatarsal heads. Neurovascularly intact distally.  Procedure: Real-time Ultrasound Guided injection of the left 3/4 intermetatarsal bursa Device: GE Logiq E  Verbal informed consent obtained.  Time-out conducted.  Noted no overlying erythema, induration, or other signs of local infection.  Skin prepped in a sterile fashion.  Local anesthesia: Topical Ethyl chloride.  With sterile technique and under real time ultrasound guidance:  1/2 cc Kenalog 40, 1/2 cc lidocaine injected easily Completed without difficulty  Pain immediately resolved suggesting accurate placement of the medication.  Advised to call if fevers/chills, erythema, induration, drainage, or persistent bleeding.  Images permanently stored and available for review in the ultrasound unit.  Impression: Technically successful ultrasound guided injection.  Impression and Recommendations:    Metatarsalgia of left foot 3/4  intermetatarsal bursa injection today. Return in a month for this.  Sebaceous cyst Doxycycline, return on Tuesday or Wednesday for excision if not doing better.  Status post left rotator cuff repair Cuff repair, reinjured, biceps and impingement symptoms. Need new MRI.   ___________________________________________ Earl [name]. Earl [name], M.D., ABFM., CAQSM. Primary Care and Sports Medicine [facility]  Adjunct Professor of Family Medicine  [employer]

## 2018-11-21 ENCOUNTER — Other Ambulatory Visit: Payer: BLUE CROSS/BLUE SHIELD

## 2018-11-23 ENCOUNTER — Ambulatory Visit: Payer: BLUE CROSS/BLUE SHIELD | Admitting: Sports Medicine

## 2018-11-26 ENCOUNTER — Encounter (INDEPENDENT_AMBULATORY_CARE_PROVIDER_SITE_OTHER): Payer: BLUE CROSS/BLUE SHIELD | Admitting: Sports Medicine

## 2018-11-26 DIAGNOSIS — L723 Sebaceous cyst: Secondary | ICD-10-CM | POA: Diagnosis not present

## 2018-11-26 IMAGING — NM NM BONE 3 PHASE
2 series · 12 of 12 positions shown · non-contrast
Comparison: Bone scan 01/03/2015

ADDENDUM:
Omitted from initial report:

Radiographic correlation: None
CLINICAL DATA: LEFT knee pain for 1.5 years, knee replacement
surgery in 3999 with repeat partial revision in 8080, question
loosening
EXAM:
NUCLEAR MEDICINE 3-PHASE BONE SCAN
TECHNIQUE: Radionuclide angiographic images, immediate static blood pool
images, and 3-hour delayed static images were obtained of the knees
after intravenous injection of radiopharmaceutical.
RADIOPHARMACEUTICALS:  21.3 mCi Xc-XXm MDP IV

[Series 1: flow · 4.14mm/px · 6 of 40 frames shown (1 of 2)]
[frame 4/40]
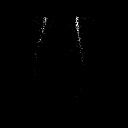
[frame 10/40]
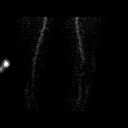
[frame 17/40]
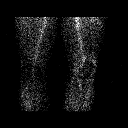
[frame 24/40]
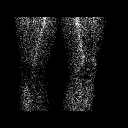
[frame 30/40]
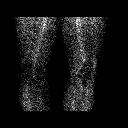
[frame 37/40]
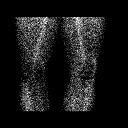

[Series 1: flow · 4.14mm/px · 6 of 40 frames shown (2 of 2)]
[frame 4/40]
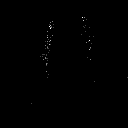
[frame 10/40]
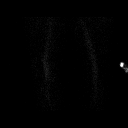
[frame 17/40]
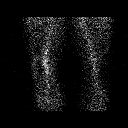
[frame 24/40]
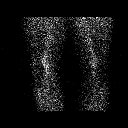
[frame 30/40]
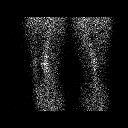
[frame 37/40]
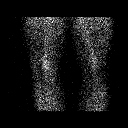

[12 of 12 positions shown; findings below may reference images not displayed]

FINDINGS: Vascular phase: Minimally increased blood flow to LEFT knee versus
RIGHT

Blood pool phase: Mildly increased blood pool surrounding LEFT knee
versus RIGHT

Delayed phase: Slightly increased focal uptake of tracer adjacent to
the tibial component of the LEFT knee prosthesis, pattern stable
since 7108 exam. No definite scintigraphic evidence of loosening or
infection. Normal uptake of tracer at remaining osseous structures.
IMPRESSION: Minimally increased blood flow and blood pool of tracer at LEFT knee
consistent with hyperemia question synovitis.

Mild chronic uptake of tracer at proximal tibia adjacent to
prosthesis unchanged since 7108.

No definite scintigraphic evidence of prosthetic loosening or
infection.

## 2018-12-02 ENCOUNTER — Other Ambulatory Visit: Payer: Self-pay

## 2018-12-02 ENCOUNTER — Encounter: Payer: Self-pay | Admitting: Sports Medicine

## 2018-12-02 ENCOUNTER — Ambulatory Visit (INDEPENDENT_AMBULATORY_CARE_PROVIDER_SITE_OTHER): Payer: BLUE CROSS/BLUE SHIELD | Admitting: Sports Medicine

## 2018-12-02 DIAGNOSIS — J340 Abscess, furuncle and carbuncle of nose: Secondary | ICD-10-CM

## 2018-12-02 HISTORY — DX: Abscess, furuncle and carbuncle of nose: J34.0

## 2018-12-02 MED ORDER — SULFAMETHOXAZOLE-TRIMETHOPRIM 800-160 MG PO TABS: 1.0000 | ORAL_TABLET | Freq: Two times a day (BID) | ORAL | 0 refills | Status: DC

## 2018-12-02 MED ORDER — HYDROCODONE-ACETAMINOPHEN 5-325 MG PO TABS: 1.0000 | ORAL_TABLET | Freq: Three times a day (TID) | ORAL | 0 refills | Status: DC | PRN

## 2018-12-02 NOTE — Progress Notes (Signed)
Subjective:    CC: Nasal swelling  HPI: Over the past few days this pleasant 53 year old male has noted swelling and pain on the left side of his nasal nare, no drainage, no trauma.  No fevers, chills, constitutional symptoms.  Pain is severe.  I reviewed the past medical history, family history, social history, surgical history, and allergies today and no changes were needed.  Please see the problem list section below in epic for further details.  Past Medical History: Past Medical History:  Diagnosis Date  . Acute urinary retention   . Arthritis   . Depression   . Hemorrhoids   . History of skin cancer in adulthood 2012   chest wall  . Hypertension   . Hypogonadism, male   . Insomnia    Past Surgical History: Past Surgical History:  Procedure Laterality Date  . APPENDECTOMY    . COLONOSCOPY    . HERNIA REPAIR    . JOINT REPLACEMENT    . OSTEOTOMY    . SHOULDER ARTHROSCOPY Left 06/2018  . TOTAL HIP ARTHROPLASTY Right   . TOTAL KNEE ARTHROPLASTY Left    Social History: Social History   Socioeconomic History  . Marital status: Divorced    Spouse name: Not on file  . Number of children: Not on file  . Years of education: Not on file  . Highest education level: Not on file  Occupational History  . Not on file  Social Needs  . Financial resource strain: Not on file  . Food insecurity:    Worry: Not on file    Inability: Not on file  . Transportation needs:    Medical: Not on file    Non-medical: Not on file  Tobacco Use  . Smoking status: Never Smoker  . Smokeless tobacco: Never Used  Substance and Sexual Activity  . Alcohol use: Not Currently  . Drug use: Never  . Sexual activity: Yes    Birth control/protection: None  Lifestyle  . Physical activity:    Days per week: Not on file    Minutes per session: Not on file  . Stress: Not on file  Relationships  . Social connections:    Talks on phone: Not on file    Gets together: Not on file    Attends  religious service: Not on file    Active member of club or organization: Not on file    Attends meetings of clubs or organizations: Not on file    Relationship status: Not on file  Other Topics Concern  . Not on file  Social History Narrative  . Not on file   Family History: Family History  Problem Relation Age of Onset  . Stroke Mother   . Skin cancer Father   . Heart disease Father   . Hypertension Father   . Aneurysm Brother   . Hypertension Sister   . Heart disease Sister   . Cancer Neg Hx   . Heart attack Neg Hx    Allergies: Allergies  Allergen Reactions  . Iodinated Diagnostic Agents Swelling    N/V - rash- red skin   . Morphine And Related Other (See Comments)    Irritability   . Temazepam Other (See Comments)    Mood change, irritability   Medications: See med rec.  Review of Systems: No fevers, chills, night sweats, weight loss, chest pain, or shortness of breath.   Objective:    General: Well Developed, well nourished, and in no acute distress.  Neuro:  Alert and oriented x3, extra-ocular muscles intact, sensation grossly intact.  HEENT: Normocephalic, atraumatic, pupils equal round reactive to light, neck supple, no masses, no lymphadenopathy, thyroid nonpalpable.  Visible swelling over the left nasal wall, palpable cellulitis, erythema, induration, no palpable collection for drainage. Skin: Warm and dry, no rashes. Cardiac: Regular rate and rhythm, no murmurs rubs or gallops, no lower extremity edema.  Respiratory: Clear to auscultation bilaterally. Not using accessory muscles, speaking in full sentences.  Impression and Recommendations:    Cellulitis of sidewall of nose Septra, hydrocodone, warm compresses. Return next week to ensure resolution.   ___________________________________________ Ihor Austinhomas J. Benjamin Stainhekkekandam, M.D., ABFM., CAQSM. Primary Care and Sports Medicine [facility]  Adjunct Professor of Family Medicine   [employer]

## 2018-12-02 NOTE — Assessment & Plan Note (Signed)
Septra, hydrocodone, warm compresses. Return next week to ensure resolution.

## 2018-12-04 ENCOUNTER — Emergency Department (HOSPITAL_COMMUNITY): Payer: BLUE CROSS/BLUE SHIELD

## 2018-12-04 ENCOUNTER — Inpatient Hospital Stay (HOSPITAL_COMMUNITY)
Admission: EM | Admit: 2018-12-04 | Discharge: 2018-12-08 | DRG: 155 | Disposition: A | Discharge status: Home / Self Care | Payer: BLUE CROSS/BLUE SHIELD | Attending: Internal Medicine | Admitting: Internal Medicine

## 2018-12-04 ENCOUNTER — Emergency Department (INDEPENDENT_AMBULATORY_CARE_PROVIDER_SITE_OTHER)
Admission: EM | Admit: 2018-12-04 | Discharge: 2018-12-04 | Disposition: A | Discharge status: Other Acute Inpatient Hospital | Payer: BLUE CROSS/BLUE SHIELD | Source: Home / Self Care | Attending: Family Medicine | Admitting: Family Medicine

## 2018-12-04 ENCOUNTER — Other Ambulatory Visit: Payer: Self-pay

## 2018-12-04 ENCOUNTER — Encounter: Payer: Self-pay | Admitting: Emergency Medicine

## 2018-12-04 ENCOUNTER — Encounter (HOSPITAL_COMMUNITY): Payer: Self-pay | Admitting: Emergency Medicine

## 2018-12-04 ENCOUNTER — Encounter: Payer: Self-pay | Admitting: Sports Medicine

## 2018-12-04 DIAGNOSIS — R220 Localized swelling, mass and lump, head: Secondary | ICD-10-CM | POA: Diagnosis not present

## 2018-12-04 DIAGNOSIS — Z96641 Presence of right artificial hip joint: Secondary | ICD-10-CM | POA: Diagnosis not present

## 2018-12-04 DIAGNOSIS — G8929 Other chronic pain: Secondary | ICD-10-CM | POA: Diagnosis present

## 2018-12-04 DIAGNOSIS — D696 Thrombocytopenia, unspecified: Secondary | ICD-10-CM | POA: Diagnosis present

## 2018-12-04 DIAGNOSIS — E291 Testicular hypofunction: Secondary | ICD-10-CM | POA: Diagnosis not present

## 2018-12-04 DIAGNOSIS — Z96652 Presence of left artificial knee joint: Secondary | ICD-10-CM | POA: Diagnosis not present

## 2018-12-04 DIAGNOSIS — J3489 Other specified disorders of nose and nasal sinuses: Secondary | ICD-10-CM

## 2018-12-04 DIAGNOSIS — Z888 Allergy status to other drugs, medicaments and biological substances status: Secondary | ICD-10-CM

## 2018-12-04 DIAGNOSIS — F5104 Psychophysiologic insomnia: Secondary | ICD-10-CM | POA: Diagnosis present

## 2018-12-04 DIAGNOSIS — F32A Depression, unspecified: Secondary | ICD-10-CM | POA: Diagnosis present

## 2018-12-04 DIAGNOSIS — N179 Acute kidney failure, unspecified: Secondary | ICD-10-CM | POA: Diagnosis present

## 2018-12-04 DIAGNOSIS — D72829 Elevated white blood cell count, unspecified: Secondary | ICD-10-CM | POA: Diagnosis present

## 2018-12-04 DIAGNOSIS — L03211 Cellulitis of face: Secondary | ICD-10-CM

## 2018-12-04 DIAGNOSIS — Z8249 Family history of ischemic heart disease and other diseases of the circulatory system: Secondary | ICD-10-CM

## 2018-12-04 DIAGNOSIS — Z823 Family history of stroke: Secondary | ICD-10-CM | POA: Diagnosis not present

## 2018-12-04 DIAGNOSIS — D72825 Bandemia: Secondary | ICD-10-CM | POA: Diagnosis not present

## 2018-12-04 DIAGNOSIS — M1990 Unspecified osteoarthritis, unspecified site: Secondary | ICD-10-CM | POA: Diagnosis not present

## 2018-12-04 DIAGNOSIS — Z85828 Personal history of other malignant neoplasm of skin: Secondary | ICD-10-CM

## 2018-12-04 DIAGNOSIS — N400 Enlarged prostate without lower urinary tract symptoms: Secondary | ICD-10-CM | POA: Diagnosis present

## 2018-12-04 DIAGNOSIS — Z808 Family history of malignant neoplasm of other organs or systems: Secondary | ICD-10-CM

## 2018-12-04 DIAGNOSIS — I10 Essential (primary) hypertension: Secondary | ICD-10-CM | POA: Diagnosis not present

## 2018-12-04 DIAGNOSIS — F329 Major depressive disorder, single episode, unspecified: Secondary | ICD-10-CM | POA: Diagnosis not present

## 2018-12-04 DIAGNOSIS — J340 Abscess, furuncle and carbuncle of nose: Principal | ICD-10-CM | POA: Diagnosis present

## 2018-12-04 DIAGNOSIS — Z91041 Radiographic dye allergy status: Secondary | ICD-10-CM

## 2018-12-04 DIAGNOSIS — Z885 Allergy status to narcotic agent status: Secondary | ICD-10-CM | POA: Diagnosis not present

## 2018-12-04 LAB — CBC WITH DIFFERENTIAL/PLATELET
Abs Immature Granulocytes: 0.06 10*3/uL (ref 0.00–0.07)
Basophils Absolute: 0 10*3/uL (ref 0.0–0.1)
Basophils Relative: 0 %
Eosinophils Absolute: 0 10*3/uL (ref 0.0–0.5)
Eosinophils Relative: 0 %
HCT: 48.3 % (ref 39.0–52.0)
Hemoglobin: 16.9 g/dL (ref 13.0–17.0)
Immature Granulocytes: 0 %
Lymphocytes Relative: 6 %
Lymphs Abs: 0.9 10*3/uL (ref 0.7–4.0)
MCH: 31.4 pg (ref 26.0–34.0)
MCHC: 35 g/dL (ref 30.0–36.0)
MCV: 89.6 fL (ref 80.0–100.0)
Monocytes Absolute: 1.1 10*3/uL — ABNORMAL HIGH (ref 0.1–1.0)
Monocytes Relative: 7 %
Neutro Abs: 12.4 10*3/uL — ABNORMAL HIGH (ref 1.7–7.7)
Neutrophils Relative %: 87 %
Platelets: 149 10*3/uL — ABNORMAL LOW (ref 150–400)
RBC: 5.39 MIL/uL (ref 4.22–5.81)
RDW: 12.6 % (ref 11.5–15.5)
WBC: 14.4 10*3/uL — ABNORMAL HIGH (ref 4.0–10.5)
nRBC: 0 % (ref 0.0–0.2)

## 2018-12-04 LAB — CBC
HCT: 46.6 % (ref 39.0–52.0)
Hemoglobin: 16 g/dL (ref 13.0–17.0)
MCH: 31.1 pg (ref 26.0–34.0)
MCHC: 34.3 g/dL (ref 30.0–36.0)
MCV: 90.7 fL (ref 80.0–100.0)
Platelets: 155 10*3/uL (ref 150–400)
RBC: 5.14 MIL/uL (ref 4.22–5.81)
RDW: 12.6 % (ref 11.5–15.5)
WBC: 15.3 10*3/uL — ABNORMAL HIGH (ref 4.0–10.5)
nRBC: 0 % (ref 0.0–0.2)

## 2018-12-04 LAB — BASIC METABOLIC PANEL
Anion gap: 11 (ref 5–15)
BUN: 26 mg/dL — ABNORMAL HIGH (ref 6–20)
CO2: 23 mmol/L (ref 22–32)
Calcium: 9.1 mg/dL (ref 8.9–10.3)
Chloride: 104 mmol/L (ref 98–111)
Creatinine, Ser: 0.91 mg/dL (ref 0.61–1.24)
GFR calc Af Amer: >60 mL/min (ref >60)
GFR calc non Af Amer: >60 mL/min (ref >60)
Glucose, Bld: 98 mg/dL (ref 70–99)
Potassium: 3.8 mmol/L (ref 3.5–5.1)
Sodium: 138 mmol/L (ref 135–145)

## 2018-12-04 LAB — CREATININE, SERUM
Creatinine, Ser: 0.8 mg/dL (ref 0.61–1.24)
GFR calc Af Amer: >60 mL/min (ref >60)
GFR calc non Af Amer: >60 mL/min (ref >60)

## 2018-12-04 MED ORDER — FENTANYL CITRATE (PF) 100 MCG/2ML IJ SOLN
50.0000 ug | Freq: Once | INTRAMUSCULAR | Status: AC
  Administered 2018-12-04: 50 ug via INTRAVENOUS
  Filled 2018-12-04: qty 2

## 2018-12-04 MED ORDER — HYDROCORTISONE NA SUCCINATE PF 250 MG IJ SOLR
200.0000 mg | Freq: Once | INTRAMUSCULAR | Status: AC
  Administered 2018-12-04: 16:00:00 200 mg via INTRAVENOUS
  Filled 2018-12-04: qty 200

## 2018-12-04 MED ORDER — SODIUM CHLORIDE (PF) 0.9 % IJ SOLN
INTRAMUSCULAR | Status: AC
  Administered 2018-12-04: 21:00:00
  Filled 2018-12-04: qty 50

## 2018-12-04 MED ORDER — ONDANSETRON HCL 4 MG/2ML IJ SOLN: 4.0000 mg | Freq: Four times a day (QID) | INTRAMUSCULAR | Status: DC | PRN

## 2018-12-04 MED ORDER — ZOLPIDEM TARTRATE 5 MG PO TABS
5.0000 mg | ORAL_TABLET | Freq: Every evening | ORAL | Status: DC | PRN
  Administered 2018-12-06 – 2018-12-08 (×3): 5 mg via ORAL
  Filled 2018-12-04 (×4): qty 1

## 2018-12-04 MED ORDER — IOHEXOL 300 MG/ML  SOLN
100.0000 mL | Freq: Once | INTRAMUSCULAR | Status: AC | PRN
  Administered 2018-12-04: 21:00:00 100 mL via INTRAVENOUS

## 2018-12-04 MED ORDER — ENOXAPARIN SODIUM 40 MG/0.4ML ~~LOC~~ SOLN
40.0000 mg | SUBCUTANEOUS | Status: DC
  Administered 2018-12-05 – 2018-12-06 (×2): 40 mg via SUBCUTANEOUS
  Filled 2018-12-04 (×3): qty 0.4

## 2018-12-04 MED ORDER — OXYCODONE HCL 5 MG PO TABS
5.0000 mg | ORAL_TABLET | ORAL | Status: DC | PRN
  Administered 2018-12-04 – 2018-12-05 (×2): 5 mg via ORAL
  Filled 2018-12-04 (×2): qty 1

## 2018-12-04 MED ORDER — DIPHENHYDRAMINE HCL 50 MG/ML IJ SOLN
50.0000 mg | Freq: Once | INTRAMUSCULAR | Status: AC
  Administered 2018-12-04: 19:00:00 50 mg via INTRAVENOUS
  Filled 2018-12-04: qty 1

## 2018-12-04 MED ORDER — CLINDAMYCIN PHOSPHATE 600 MG/50ML IV SOLN
600.0000 mg | Freq: Once | INTRAVENOUS | Status: AC
  Administered 2018-12-04: 16:00:00 600 mg via INTRAVENOUS
  Filled 2018-12-04: qty 50

## 2018-12-04 MED ORDER — ACETAMINOPHEN 650 MG RE SUPP: 650.0000 mg | Freq: Four times a day (QID) | RECTAL | Status: DC | PRN

## 2018-12-04 MED ORDER — FENTANYL CITRATE (PF) 100 MCG/2ML IJ SOLN
50.0000 ug | Freq: Once | INTRAMUSCULAR | Status: AC
  Administered 2018-12-04: 20:00:00 50 ug via INTRAVENOUS
  Filled 2018-12-04: qty 2

## 2018-12-04 MED ORDER — DIPHENHYDRAMINE HCL 25 MG PO CAPS: 50.0000 mg | ORAL_CAPSULE | Freq: Once | ORAL | Status: AC

## 2018-12-04 MED ORDER — SODIUM CHLORIDE 0.9 % IV SOLN
3.0000 g | Freq: Four times a day (QID) | INTRAVENOUS | Status: DC
  Administered 2018-12-04 – 2018-12-08 (×15): 3 g via INTRAVENOUS
  Filled 2018-12-04 (×16): qty 3

## 2018-12-04 MED ORDER — ACETAMINOPHEN 325 MG PO TABS: 650.0000 mg | ORAL_TABLET | Freq: Four times a day (QID) | ORAL | Status: DC | PRN

## 2018-12-04 MED ORDER — ENOXAPARIN SODIUM 40 MG/0.4ML ~~LOC~~ SOLN: 40.0000 mg | SUBCUTANEOUS | Status: DC

## 2018-12-04 MED ORDER — FENTANYL CITRATE (PF) 100 MCG/2ML IJ SOLN
50.0000 ug | Freq: Once | INTRAMUSCULAR | Status: AC
  Administered 2018-12-04: 18:00:00 50 ug via INTRAVENOUS
  Filled 2018-12-04: qty 2

## 2018-12-04 MED ORDER — ONDANSETRON HCL 4 MG PO TABS: 4.0000 mg | ORAL_TABLET | Freq: Four times a day (QID) | ORAL | Status: DC | PRN

## 2018-12-04 NOTE — ED Notes (Signed)
ED TO INPATIENT HANDOFF REPORT  ED Nurse Name and Phone #: Zannie Kehr Name/Age/Gender Earl [name] 53 y.o. male Room/Bed: WA14/WA14  Code Status   Code Status: Full Code  Home/SNF/Other Home Patient oriented to: self, place, time and situation Is this baseline? Yes   Triage Complete: Triage complete  Chief Complaint Nose Pain/Left Swollen   Triage Note Pt being treated for cellulitis on nose which has now spread to left eye. Sent by [facility] in [place] to ED for further evaluation since not responding to current antibiotics. Denies visual problems in left eye   Allergies Allergies  Allergen Reactions  . Iodinated Diagnostic Agents Swelling    N/V - rash- red skin   . Morphine And Related Other (See Comments)    Irritability   . Temazepam Other (See Comments)    Mood change, irritability    Level of Care/Admitting Diagnosis ED Disposition    ED Disposition Condition Comment   Admit  Hospital Area: [facility] [[id number]]  Level of Care: Med-Surg [16]  Diagnosis: Facial cellulitis [354656]  Admitting Physician: Almon Hercules [[id number]]  Attending Physician: Almon Hercules [[id number]]  PT Class (Do Not Modify): Observation [104]  PT Acc Code (Do Not Modify): Observation [10022]       B Medical/Surgery History Past Medical History:  Diagnosis Date  . Acute urinary retention   . Arthritis   . Depression   . Hemorrhoids   . History of skin cancer in adulthood 2012   chest wall  . Hypertension   . Hypogonadism, male   . Insomnia    Past Surgical History:  Procedure Laterality Date  . APPENDECTOMY    . COLONOSCOPY    . HERNIA REPAIR    . JOINT REPLACEMENT    . OSTEOTOMY    . SHOULDER ARTHROSCOPY Left 06/2018  . TOTAL HIP ARTHROPLASTY Right   . TOTAL KNEE ARTHROPLASTY Left      A IV Location/Drains/Wounds Patient Lines/Drains/Airways Status   Active Line/Drains/Airways    Name:   Placement date:   Placement time:   Site:    Days:   Peripheral IV 12/04/18 Left;Anterior;Proximal Forearm   12/04/18    1626    Forearm   less than 1          Intake/Output Last 24 hours  Intake/Output Summary (Last 24 hours) at 12/04/2018 2307 Last data filed at 12/04/2018 2305 Gross per 24 hour  Intake 99.86 ml  Output -  Net 99.86 ml    Labs/Imaging Results for orders placed or performed during the hospital encounter of 12/04/18 (from the past 48 hour(s))  CBC with Differential     Status: Abnormal   Collection Time: 12/04/18  4:35 PM  Result Value Ref Range   WBC 14.4 (H) 4.0 - 10.5 K/uL   RBC 5.39 4.22 - 5.81 MIL/uL   Hemoglobin 16.9 13.0 - 17.0 g/dL   HCT 49.6 75.9 - 16.3 %   MCV 89.6 80.0 - 100.0 fL   MCH 31.4 26.0 - 34.0 pg   MCHC 35.0 30.0 - 36.0 g/dL   RDW 84.6 65.9 - 93.5 %   Platelets 149 (L) 150 - 400 K/uL   nRBC 0.0 0.0 - 0.2 %   Neutrophils Relative % 87 %   Neutro Abs 12.4 (H) 1.7 - 7.7 K/uL   Lymphocytes Relative 6 %   Lymphs Abs 0.9 0.7 - 4.0 K/uL   Monocytes Relative 7 %   Monocytes Absolute 1.1 (  H) 0.1 - 1.0 K/uL   Eosinophils Relative 0 %   Eosinophils Absolute 0.0 0.0 - 0.5 K/uL   Basophils Relative 0 %   Basophils Absolute 0.0 0.0 - 0.1 K/uL   Immature Granulocytes 0 %   Abs Immature Granulocytes 0.06 0.00 - 0.07 K/uL    Comment: Performed at [facility], [address] [place], [place] [address]  Basic metabolic panel     Status: Abnormal   Collection Time: 12/04/18  4:35 PM  Result Value Ref Range   Sodium 138 135 - 145 mmol/L   Potassium 3.8 3.5 - 5.1 mmol/L   Chloride 104 98 - 111 mmol/L   CO2 23 22 - 32 mmol/L   Glucose, Bld 98 70 - 99 mg/dL   BUN 26 (H) 6 - 20 mg/dL   Creatinine, Ser 1.300.91 0.61 - 1.24 mg/dL   Calcium 9.1 8.9 - 86.510.3 mg/dL   GFR calc non Af Amer >60 >60 mL/min   GFR calc Af Amer >60 >60 mL/min   Anion gap 11 5 - 15    Comment: Performed at [facility], [address] [place], [place] [address]  CBC     Status: Abnormal    Collection Time: 12/04/18 10:12 PM  Result Value Ref Range   WBC 15.3 (H) 4.0 - 10.5 K/uL   RBC 5.14 4.22 - 5.81 MIL/uL   Hemoglobin 16.0 13.0 - 17.0 g/dL   HCT 62.946.6 52.839.0 - 41.352.0 %   MCV 90.7 80.0 - 100.0 fL   MCH 31.1 26.0 - 34.0 pg   MCHC 34.3 30.0 - 36.0 g/dL   RDW 24.412.6 01.011.5 - 27.215.5 %   Platelets 155 150 - 400 K/uL   nRBC 0.0 0.0 - 0.2 %    Comment: Performed at [facility], [address] [place], [place] [address]   Ct Maxillofacial W Contrast  Result Date: 12/04/2018 CLINICAL DATA:  Left facial and nasal swelling and tenderness. Cellulitis. EXAM: CT MAXILLOFACIAL WITH CONTRAST TECHNIQUE: Multidetector CT imaging of the maxillofacial structures was performed with intravenous contrast. Multiplanar CT image reconstructions were also generated. CONTRAST:  100mL OMNIPAQUE IOHEXOL 300 MG/ML  SOLN COMPARISON:  None. FINDINGS: Osseous: No acute fracture or destructive osseous process. Orbits: The globes appear intact. No postseptal inflammation. Sinuses: Minimal left maxillary sinus mucosal thickening. No sinus fluid. Clear mastoid air cells. Soft tissues: There is asymmetric soft tissue thickening involving the left lateral aspect of the nose extending inferiorly to the ala. There is some mild hypoattenuation within the area of greatest swelling, however a drainable fluid collection is not evident. There is mild swelling extending more laterally into the soft tissues anterior to the maxillary sinus just inferior to the orbit. Limited intracranial: Unremarkable. IMPRESSION: Left-sided nasal and facial swelling consistent with cellulitis. No abscess. Electronically Signed   By: Sebastian AcheAllen  Grady M.D.   On: 12/04/2018 21:05    Pending Labs Unresulted Labs (From admission, onward)    Start     Ordered   12/11/18 0500  Creatinine, serum  (enoxaparin (LOVENOX)    CrCl >/= 30 ml/min)  Weekly,   R    Comments:  while on enoxaparin therapy    12/04/18 2153   12/05/18 0500  Basic  metabolic panel  Tomorrow morning,   R     12/04/18 2153   12/05/18 0500  CBC  Tomorrow morning,   R     12/04/18 2153   12/04/18 2149  HIV antibody (  Routine Testing)  Once,   R     12/04/18 2153   12/04/18 2149  Creatinine, serum  (enoxaparin (LOVENOX)    CrCl >/= 30 ml/min)  Once,   R    Comments:  Baseline for enoxaparin therapy IF NOT ALREADY DRAWN.    12/04/18 2153          Vitals/Pain Today's Vitals   12/04/18 1930 12/04/18 2127 12/04/18 2238 12/04/18 2259  BP: (!) 138/92 132/89 126/78   Pulse: 70 77 74   Resp: Temp: 98 F (36.7 C) 97.9 F (36.6 C) 98 F (36.7 C)   TempSrc:      SpO2: 95% 96% 99%   PainSc:    2     Isolation Precautions No active isolations  Medications Medications  zolpidem (AMBIEN) tablet 5 mg (has no administration in time range)  enoxaparin (LOVENOX) injection 40 mg (has no administration in time range)  acetaminophen (TYLENOL) tablet 650 mg (has no administration in time range)    Or  acetaminophen (TYLENOL) suppository 650 mg (has no administration in time range)  oxyCODONE (Oxy IR/ROXICODONE) immediate release tablet 5 mg (5 mg Oral Given 12/04/18 2214)  ondansetron (ZOFRAN) tablet 4 mg (has no administration in time range)    Or  ondansetron (ZOFRAN) injection 4 mg (has no administration in time range)  Ampicillin-Sulbactam (UNASYN) 3 g in sodium chloride 0.9 % 100 mL IVPB (0 g Intravenous Stopped 12/04/18 2305)  clindamycin (CLEOCIN) IVPB 600 mg (0 mg Intravenous Stopped 12/04/18 1712)  hydrocortisone sodium succinate (SOLU-CORTEF) injection 200 mg (200 mg Intravenous Given 12/04/18 1628)  diphenhydrAMINE (BENADRYL) capsule 50 mg ( Oral See Alternative 12/04/18 1927)    Or  diphenhydrAMINE (BENADRYL) injection 50 mg (50 mg Intravenous Given 12/04/18 1927)  fentaNYL (SUBLIMAZE) injection 50 mcg (50 mcg Intravenous Given 12/04/18 1627)  fentaNYL (SUBLIMAZE) injection 50 mcg (50 mcg Intravenous Given 12/04/18 1759)  fentaNYL  (SUBLIMAZE) injection 50 mcg (50 mcg Intravenous Given 12/04/18 2001)  sodium chloride (PF) 0.9 % injection (  Given 12/04/18 2037)  iohexol (OMNIPAQUE) 300 MG/ML solution 100 mL (100 mLs Intravenous Contrast Given 12/04/18 2035)    Mobility walks Low fall risk   Focused Assessments Neuro Assessment Handoff:  Swallow screen pass? Yes  Cardiac Rhythm: Normal sinus rhythm       Neuro Assessment:   Neuro Checks:      Last Documented NIHSS Modified Score:   Has TPA been given? No If patient is a Neuro Trauma and patient is going to OR before floor call report to 4N Charge nurse: [phone] or [phone]     R Recommendations: See Admitting Provider Note  Report given to:   Additional Notes: [address]

## 2018-12-04 NOTE — H&P (Signed)
History and Physical    Earl [name] [id number] DOB: Mar 26, 1966 DOA: 12/04/2018  PCP: Carlis Stableummings, Charley Elizabeth, PA-C Patient coming from: Home  Chief Complaint: Left node swelling and pain  HPI: Earl [name] is a 53 y.o. male with history of insomnia, BPH, thrombocytopenia and chronic pain presenting with left node swelling and pain.  Noticed what he called a "bump" in his left nose about 4 days ago which was tender to touch.  Two days later, the swelling gotten worse and his left nose was shut.  He went to his PCP and was given a prescription for Bactrim.  He woke up this morning with more swelling in his left face.  He also noted that his left eye felt heavy.  He presented to urgent care and sent to ED. Denies prior history of this.  Denies URI symptoms, sore throat, fever, chills, eye pain, headache, chest pain, dyspnea, nausea, vomiting, abdominal pain, diarrhea or dysuria. He reports taking his Bactrim as prescribed.  Denies history of diabetes.  He denies smoking cigarettes, drinking alcohol recreational drug use.  Denies snorting drugs.  In ED, vital signs not impressive except for mildly elevated blood pressure.  CBC with mild leukocytosis to 14.4 and borderline thrombocytopenia at 149.  BMP not impressive. Maxillofacial CT with contrast showed left-sided nasal and facial swelling consistent with cellulitis but no abscess.  Started on clindamycin and will service was called for admission.   ROS All review of system negative except for pertinent positives and negatives as history of present illness above. PMH Past Medical History:  Diagnosis Date  . Acute urinary retention   . Arthritis   . Depression   . Hemorrhoids   . History of skin cancer in adulthood 2012   chest wall  . Hypertension   . Hypogonadism, male   . Insomnia    PSH Past Surgical History:  Procedure Laterality Date  . APPENDECTOMY    . COLONOSCOPY    . HERNIA REPAIR    . JOINT  REPLACEMENT    . OSTEOTOMY    . SHOULDER ARTHROSCOPY Left 06/2018  . TOTAL HIP ARTHROPLASTY Right   . TOTAL KNEE ARTHROPLASTY Left    Fam HX Family History  Problem Relation Age of Onset  . Stroke Mother   . Skin cancer Father   . Heart disease Father   . Hypertension Father   . Aneurysm Brother   . Hypertension Sister   . Heart disease Sister   . Cancer Neg Hx   . Heart attack Neg Hx     Social Hx  reports that he has never smoked. He has never used smokeless tobacco. He reports previous alcohol use. He reports that he does not use drugs.  Allergy Allergies  Allergen Reactions  . Iodinated Diagnostic Agents Swelling    N/V - rash- red skin   . Morphine And Related Other (See Comments)    Irritability   . Temazepam Other (See Comments)    Mood change, irritability   Home Meds Prior to Admission medications   Medication Sig Start Date End Date Taking? Authorizing Provider  Eszopiclone (ESZOPICLONE) 3 MG TABS Take 1 tablet (3 mg total) by mouth at bedtime as needed. Patient taking differently: Take 3 mg by mouth at bedtime.  11/16/18 11/16/19 Yes Carlis Stableummings, Charley Elizabeth, PA-C  HYDROcodone-acetaminophen (NORCO/VICODIN) 5-325 MG tablet Take 1 tablet by mouth every 8 (eight) hours as needed for moderate pain. 12/02/18  Yes Monica Bectonhekkekandam, Thomas J, MD  Multiple Vitamins-Minerals (  MULTIVITAL PO) Take 1 tablet by mouth daily.    Yes [provider]  sulfamethoxazole-trimethoprim (BACTRIM DS,SEPTRA DS) 800-160 MG tablet Take 1 tablet by mouth 2 (two) times daily. 12/02/18  Yes Monica Becton, MD  testosterone cypionate (DEPOTESTOSTERONE CYPIONATE) 200 MG/ML injection Inject 200 mg into the muscle every 14 (fourteen) days.  11/02/17  Yes [provider]  cyclobenzaprine (FLEXERIL) 5 MG tablet Take 1 tablet (5 mg total) by mouth at bedtime as needed for muscle spasms. 05/07/18   Carlis Stable, PA-C  ibuprofen (ADVIL,MOTRIN) 800 MG tablet Take 1  tablet (800 mg total) by mouth every 8 (eight) hours as needed. Patient taking differently: Take 800 mg by mouth every 8 (eight) hours as needed for moderate pain.  10/05/18   Monica Becton, MD  sildenafil (REVATIO) 20 MG tablet 1-5 tab PO 15 minutes prior to sexual activity as needed Patient taking differently: Take 20 mg by mouth daily as needed (ed). 1-5 tab PO 15 minutes prior to sexual activity 02/24/18   Carlis Stable, PA-C  SUMAtriptan (IMITREX) 50 MG tablet Take 1 tablet (50 mg total) by mouth once as needed for up to 1 dose for migraine. May repeat in 2 hours if headache persists or recurs. 02/24/18   Carlis Stable, PA-C  zolpidem (AMBIEN CR) 6.25 MG CR tablet Take 1 tablet (6.25 mg total) by mouth at bedtime as needed for sleep. 05/07/18 05/10/18  Carlis Stable, PA-C    Physical Exam: Vitals:   12/04/18 1600 12/04/18 1700 12/04/18 1930 12/04/18 2127  BP: (!) 144/86 (!) 144/113 (!) 138/92 132/89  Pulse: 79 74 70 77  Resp:   17 15  Temp:   98 F (36.7 C) 97.9 F (36.6 C)  TempSrc:      SpO2: 91% 91% 95% 96%    Constitutional - resting comfortably, no acute distress Eyes - vision grossly intact. Sclera anicteric.  PERRLA.  EOMI. Nose -swelling of the lateral wall of his left nares with overlying skin erythema. Mouth - no oral lesions noted Throat - no swelling or erythema Neck: No lymphadenopathy. Endo - no obvious thyromegaly CV -RRR. (+)S1S2, no murmurs; no JVD or peripheral edema Resp - No increased work of breathing, good aeration bilaterally, no wheeze or crackles GI - (+)BS, soft, non-tender, non-distended MSK - normal range of motion, no obvious deformity Skin - no obvious rashes or lesions Neuro - alert, aware, oriented to person/place/time. No gross deficit Psych - calm, normal mood and affect  Labs on Admission: I have personally reviewed following labs and imaging studies  CBC: Recent Labs  Lab 12/04/18 1635  WBC  14.4*  NEUTROABS 12.4*  HGB 16.9  HCT 48.3  MCV 89.6  PLT 149*   Basic Metabolic Panel: Recent Labs  Lab 12/04/18 1635  NA 138  K 3.8  CL 104  CO2 23  GLUCOSE 98  BUN 26*  CREATININE 0.91  CALCIUM 9.1   GFR: Estimated Creatinine Clearance: 99.1 mL/min (by C-G formula based on SCr of 0.91 mg/dL). Liver Function Tests: No results for input(s): AST, ALT, ALKPHOS, BILITOT, PROT, ALBUMIN in the last 168 hours. No results for input(s): LIPASE, AMYLASE in the last 168 hours. No results for input(s): AMMONIA in the last 168 hours. Coagulation Profile: No results for input(s): INR, PROTIME in the last 168 hours. Cardiac Enzymes: No results for input(s): CKTOTAL, CKMB, CKMBINDEX, TROPONINI in the last 168 hours. BNP (last 3 results) No results for input(s): PROBNP  in the last 8760 hours. HbA1C: No results for input(s): HGBA1C in the last 72 hours. CBG: No results for input(s): GLUCAP in the last 168 hours. Lipid Profile: No results for input(s): CHOL, HDL, LDLCALC, TRIG, CHOLHDL, LDLDIRECT in the last 72 hours. Thyroid Function Tests: No results for input(s): TSH, T4TOTAL, FREET4, T3FREE, THYROIDAB in the last 72 hours. Anemia Panel: No results for input(s): VITAMINB12, FOLATE, FERRITIN, TIBC, IRON, RETICCTPCT in the last 72 hours. Urine analysis:    Component Value Date/Time   COLORURINE YELLOW 10/22/2018 1401   APPEARANCEUR CLEAR 10/22/2018 1401   LABSPEC 1.015 10/22/2018 1401   PHURINE 6.0 10/22/2018 1401   GLUCOSEU NEGATIVE 10/22/2018 1401   HGBUR NEGATIVE 10/22/2018 1401   KETONESUR NEGATIVE 10/22/2018 1401   PROTEINUR NEGATIVE 10/22/2018 1401   NITRITE NEGATIVE 10/22/2018 1401   LEUKOCYTESUR NEGATIVE 10/22/2018 1401    Sepsis Labs:  Mild leukocytosis to 14.4.  Radiological Exams on Admission: Ct Maxillofacial W Contrast  Result Date: 12/04/2018 CLINICAL DATA:  Left facial and nasal swelling and tenderness. Cellulitis. EXAM: CT MAXILLOFACIAL WITH CONTRAST  TECHNIQUE: Multidetector CT imaging of the maxillofacial structures was performed with intravenous contrast. Multiplanar CT image reconstructions were also generated. CONTRAST:  OMNIPAQUE IOHEXOL 300 MG/ML  SOLN COMPARISON:  None. FINDINGS: Osseous: No acute fracture or destructive osseous process. Orbits: The globes appear intact. No postseptal inflammation. Sinuses: Minimal left maxillary sinus mucosal thickening. No sinus fluid. Clear mastoid air cells. Soft tissues: There is asymmetric soft tissue thickening involving the left lateral aspect of the nose extending inferiorly to the ala. There is some mild hypoattenuation within the area of greatest swelling, however a drainable fluid collection is not evident. There is mild swelling extending more laterally into the soft tissues anterior to the maxillary sinus just inferior to the orbit. Limited intracranial: Unremarkable. IMPRESSION: Left-sided nasal and facial swelling consistent with cellulitis. No abscess. Electronically Signed   By: Sebastian Ache M.D.   On: 12/04/2018 21:05    All images have been reviewed by me personally.   EKG: None obtained.  Assessment/Plan Principal Problem:   Facial cellulitis Active Problems:   Chronic insomnia   Thrombocytopenia (HCC)   Leukocytosis   Depression  Facial cellulitis: maxillofacial CT with contrast negative for abscess.  Failed outpatient treatment with p.o. Bactrim. -Status post clindamycin in ED. -Unasyn 600 mg every 6 hours for broader coverage -PRN Tylenol and oxycodone as needed for pain.  Leukocytosis: Likely due to the above. -Continue trending  Chronic insomnia: Lunesta at home. -Ambien at bedtime as needed  Thrombocytopenia/chronic pain/depression: Stable. -Continue home medications  DVT prophylaxis: Subcu Lovenox  Code Status: Full code Family Communication: None at bedside  Disposition Plan: Admit to MedSurg Consults called: None Admission status: Observation status    Almon Hercules MD [facility]  If 7PM-7AM, please contact night-coverage [email or web address] Password [facility]  12/04/2018, 10:02 PM

## 2018-12-04 NOTE — ED Provider Notes (Signed)
[facility]-EMERGENCY DEPT Provider Note   CSN: [id number] Arrival date & time: 12/04/18  1513    History   Chief Complaint Chief Complaint  Patient presents with  . Cellulitis  . sent by Urgent Care    HPI Earl [name] is a 53 y.o. male presenting for evaluation of facial pain and swelling.  Patient states 5 days ago he developed a small tender lesion on the inside of his left nostril.  He was seen by his primary care doctor, started on Bactrim.  He has taken 3 days of Bactrim, but reports worsening spreading and pain.  He states today his left nostril is completely occluded, and redness, swelling, and tenderness was extended towards his left eye.  He reports a heaviness/fullness of his left eye, pain with certain movements such as looking up.  He denies drainage from his nose.  He denies history of similar.  He denies fevers, chills, vision changes, ear pain, dental pain, nausea, or vomiting.  He states he has no other medical problems, takes lunesta and HRT.  He was seen at urgent care today, and encouraged to come to the ED for further evaluation.     HPI  Past Medical History:  Diagnosis Date  . Acute urinary retention   . Arthritis   . Depression   . Hemorrhoids   . History of skin cancer in adulthood 2012   chest wall  . Hypertension   . Hypogonadism, male   . Insomnia     Patient Active Problem List   Diagnosis Date Noted  . Cellulitis of sidewall of nose 12/02/2018  . Sebaceous cyst 11/19/2018  . Thrombocytopenia (HCC) 10/28/2018  . Polydipsia 10/28/2018  . Dry mouth 10/28/2018  . TMJ tenderness, right 05/07/2018  . Status post left rotator cuff repair 05/07/2018  . History of arthroplasty of left knee 03/03/2018  . Hypertension goal BP (blood pressure) < 130/80 02/26/2018  . Generalized edema 02/26/2018  . Erectile dysfunction 02/26/2018  . Migraine without aura and without status migrainosus, not intractable 02/26/2018  . Elevated  AST (SGOT) 02/26/2018  . Chronic midline low back pain without sciatica 01/06/2018  . Benign prostatic hyperplasia with urinary frequency 08/28/2017  . Recurrent major depressive disorder, in partial remission (HCC) 07/21/2017  . Methadone use disorder, mild, abuse (HCC) 04/29/2017  . Metatarsalgia of left foot 04/02/2017  . Chronic insomnia 01/22/2017  . Loose total hip arthroplasty (HCC) 12/31/2016  . Mood disorder with depressive features due to medical condition 11/06/2015  . Post right SI joint fusion 11/06/2015  . Low testosterone 07/25/2014  . Osteoarthritis of hip 07/07/2013  . Chronic pain of right knee 08/10/2008  . Eczema 02/16/2007    Past Surgical History:  Procedure Laterality Date  . APPENDECTOMY    . COLONOSCOPY    . HERNIA REPAIR    . JOINT REPLACEMENT    . OSTEOTOMY    . SHOULDER ARTHROSCOPY Left 06/2018  . TOTAL HIP ARTHROPLASTY Right   . TOTAL KNEE ARTHROPLASTY Left         Home Medications    Prior to Admission medications   Medication Sig Start Date End Date Taking? Authorizing Provider  Eszopiclone (ESZOPICLONE) 3 MG TABS Take 1 tablet (3 mg total) by mouth at bedtime as needed. Patient taking differently: Take 3 mg by mouth at bedtime.  11/16/18 11/16/19 Yes Carlis Stable, PA-C  HYDROcodone-acetaminophen (NORCO/VICODIN) 5-325 MG tablet Take 1 tablet by mouth every 8 (eight) hours as needed for  moderate pain. 12/02/18  Yes Monica Becton, MD  Multiple Vitamins-Minerals (MULTIVITAL PO) Take 1 tablet by mouth daily.    Yes [provider]  sulfamethoxazole-trimethoprim (BACTRIM DS,SEPTRA DS) 800-160 MG tablet Take 1 tablet by mouth 2 (two) times daily. 12/02/18  Yes Monica Becton, MD  testosterone cypionate (DEPOTESTOSTERONE CYPIONATE) 200 MG/ML injection Inject 200 mg into the muscle every 14 (fourteen) days.  11/02/17  Yes [provider]  cyclobenzaprine (FLEXERIL) 5 MG tablet Take 1 tablet (5 mg total) by  mouth at bedtime as needed for muscle spasms. 05/07/18   Carlis Stable, PA-C  ibuprofen (ADVIL,MOTRIN) 800 MG tablet Take 1 tablet (800 mg total) by mouth every 8 (eight) hours as needed. Patient taking differently: Take 800 mg by mouth every 8 (eight) hours as needed for moderate pain.  10/05/18   Monica Becton, MD  sildenafil (REVATIO) 20 MG tablet 1-5 tab PO 15 minutes prior to sexual activity as needed Patient taking differently: Take 20 mg by mouth daily as needed (ed). 1-5 tab PO 15 minutes prior to sexual activity 02/24/18   Carlis Stable, PA-C  SUMAtriptan (IMITREX) 50 MG tablet Take 1 tablet (50 mg total) by mouth once as needed for up to 1 dose for migraine. May repeat in 2 hours if headache persists or recurs. 02/24/18   Carlis Stable, PA-C  zolpidem (AMBIEN CR) 6.25 MG CR tablet Take 1 tablet (6.25 mg total) by mouth at bedtime as needed for sleep. 05/07/18 05/10/18  Carlis Stable, PA-C    Family History Family History  Problem Relation Age of Onset  . Stroke Mother   . Skin cancer Father   . Heart disease Father   . Hypertension Father   . Aneurysm Brother   . Hypertension Sister   . Heart disease Sister   . Cancer Neg Hx   . Heart attack Neg Hx     Social History Social History   Tobacco Use  . Smoking status: Never Smoker  . Smokeless tobacco: Never Used  Substance Use Topics  . Alcohol use: Not Currently  . Drug use: Never     Allergies   Iodinated diagnostic agents; Morphine and related; and Temazepam   Review of Systems Review of Systems  HENT: Positive for facial swelling.   All other systems reviewed and are negative.    Physical Exam Updated Vital Signs BP 132/89   Pulse 77   Temp 97.9 F (36.6 C)   Resp 15   SpO2 96%   Physical Exam Vitals signs and nursing note reviewed.  Constitutional:      General: He is not in acute distress.    Appearance: He is well-developed.      Comments: Appears nontoxic  HENT:     Head: Normocephalic and atraumatic.      Comments: Complete occlusion/swelling of the left nare.  Erythema, tenderness, and swelling of the left side of the nose extending over the left zygoma. No periobital swelling.  Eyes:     General:        Right eye: No discharge.        Left eye: No discharge.     Extraocular Movements: Extraocular movements intact.     Conjunctiva/sclera: Conjunctivae normal.     Pupils: Pupils are equal, round, and reactive to light.     Comments: EOMI and PERRLA. Pt reports discomfort when looking up  Neck:     Musculoskeletal: Normal range of motion and neck  supple.  Cardiovascular:     Rate and Rhythm: Normal rate and regular rhythm.     Pulses: Normal pulses.  Pulmonary:     Effort: Pulmonary effort is normal. No respiratory distress.     Breath sounds: Normal breath sounds. No wheezing.  Abdominal:     General: There is no distension.     Palpations: Abdomen is soft. There is no mass.     Tenderness: There is no abdominal tenderness. There is no guarding or rebound.  Musculoskeletal: Normal range of motion.  Skin:    General: Skin is warm and dry.     Capillary Refill: Capillary refill takes less than 2 seconds.  Neurological:     Mental Status: He is alert and oriented to person, place, and time.      ED Treatments / Results  Labs (all labs ordered are listed, but only abnormal results are displayed) Labs Reviewed  CBC WITH DIFFERENTIAL/PLATELET - Abnormal; Notable for the following components:      Result Value   WBC 14.4 (*)    Platelets 149 (*)    Neutro Abs 12.4 (*)    Monocytes Absolute 1.1 (*)    All other components within normal limits  BASIC METABOLIC PANEL - Abnormal; Notable for the following components:   BUN 26 (*)    All other components within normal limits    EKG None  Radiology Ct Maxillofacial W Contrast  Result Date: 12/04/2018 CLINICAL DATA:  Left facial and nasal swelling  and tenderness. Cellulitis. EXAM: CT MAXILLOFACIAL WITH CONTRAST TECHNIQUE: Multidetector CT imaging of the maxillofacial structures was performed with intravenous contrast. Multiplanar CT image reconstructions were also generated. CONTRAST:  OMNIPAQUE IOHEXOL 300 MG/ML  SOLN COMPARISON:  None. FINDINGS: Osseous: No acute fracture or destructive osseous process. Orbits: The globes appear intact. No postseptal inflammation. Sinuses: Minimal left maxillary sinus mucosal thickening. No sinus fluid. Clear mastoid air cells. Soft tissues: There is asymmetric soft tissue thickening involving the left lateral aspect of the nose extending inferiorly to the ala. There is some mild hypoattenuation within the area of greatest swelling, however a drainable fluid collection is not evident. There is mild swelling extending more laterally into the soft tissues anterior to the maxillary sinus just inferior to the orbit. Limited intracranial: Unremarkable. IMPRESSION: Left-sided nasal and facial swelling consistent with cellulitis. No abscess. Electronically Signed   By: Sebastian Ache M.D.   On: 12/04/2018 21:05    Procedures Procedures (including critical care time)  Medications Ordered in ED Medications  clindamycin (CLEOCIN) IVPB 600 mg (0 mg Intravenous Stopped 12/04/18 1712)  hydrocortisone sodium succinate (SOLU-CORTEF) injection 200 mg (200 mg Intravenous Given 12/04/18 1628)  diphenhydrAMINE (BENADRYL) capsule 50 mg ( Oral See Alternative 12/04/18 1927)    Or  diphenhydrAMINE (BENADRYL) injection 50 mg (50 mg Intravenous Given 12/04/18 1927)  fentaNYL (SUBLIMAZE) injection 50 mcg (50 mcg Intravenous Given 12/04/18 1627)  fentaNYL (SUBLIMAZE) injection 50 mcg (50 mcg Intravenous Given 12/04/18 1759)  fentaNYL (SUBLIMAZE) injection 50 mcg (50 mcg Intravenous Given 12/04/18 2001)  sodium chloride (PF) 0.9 % injection (  Given 12/04/18 2037)  iohexol (OMNIPAQUE) 300 MG/ML solution 100 mL (100 mLs Intravenous  Contrast Given 12/04/18 2035)     Initial Impression / Assessment and Plan / ED Course  I have reviewed the triage vital signs and the nursing notes.  Pertinent labs & imaging results that were available during my care of the patient were reviewed by me and considered in my  medical decision making (see chart for details).        Patient presenting for evaluation of new swelling, redness, and tenderness, extending towards his eye.  Physical exam consistent with cellulitis with possible underlying abscess.  No obvious periorbital swelling or entrapment, however concern for early preseptal cellulitis.  I am concerned that symptoms have worsened while being on Bactrim for 3 days.  As such, will obtain labs and ct for further evaluation. Pt with contrast allergy, will give premedication orders. Fentanyl for pain, cipro IV started.   Labs show leukocytosis at 14.5.  Otherwise reassuring.  CT pending.  After premedication, CT obtained, shows cellulitis without drainable abscess.  Cellulitis extends to just below the orbit.  Considering spread while on oral antibiotics, and location of cellulitis, will call for admission.  [facility].   Final Clinical Impressions(s) / ED Diagnoses   Final diagnoses:  Cellulitis of face    ED Discharge Orders    None       Drema BalzarineCaccavale, Quinlan Mcfall, PA-C 12/04/18 2138    Lorre NickAllen, Anthony, MD 12/05/18 1315

## 2018-12-04 NOTE — ED Provider Notes (Signed)
[name]    CSN: [id number] Arrival date & time: 12/04/18  1357     History   Chief Complaint Chief Complaint  Patient presents with  . Cellulitis    HPI Earl [name] is a 53 y.o. male.   Patient developed a sore spot in his left nose about 5 days ago.  The area became increasingly painful/swollen, and he visited his PCP 2 days ago who prescribed Bactrim DS and warm compresses.  Patient states that he has had no improvement, with further increases of pain and swelling in his left nares extending to his left cheek.  This morning he awoke with a vague sensation of "heaviness" in his left eye, although he denies left eye pain or vision changes.  He denies fevers, chills, and sweats.  The history is provided by the patient.  Abscess  Location:  Face Facial abscess location:  L cheek and nose Abscess quality: induration, painful and redness   Abscess quality: not draining, no fluctuance and not weeping   Duration:  5 days Progression:  Worsening Pain details:    Quality:  Pressure and throbbing   Severity:  Severe   Duration:  5 days   Timing:  Constant   Progression:  Worsening Chronicity:  New Context: not skin injury   Relieved by:  Nothing Exacerbated by: contact. Ineffective treatments: antibiotics and warm compresses. Associated symptoms: no fatigue, no fever, no headaches, no nausea and no vomiting     Past Medical History:  Diagnosis Date  . Acute urinary retention   . Arthritis   . Depression   . Hemorrhoids   . History of skin cancer in adulthood 2012   chest wall  . Hypertension   . Hypogonadism, male   . Insomnia     Patient Active Problem List   Diagnosis Date Noted  . Cellulitis of sidewall of nose 12/02/2018  . Sebaceous cyst 11/19/2018  . Thrombocytopenia (HCC) 10/28/2018  . Polydipsia 10/28/2018  . Dry mouth 10/28/2018  . TMJ tenderness, right 05/07/2018  . Status post left rotator cuff repair 05/07/2018  . History of  arthroplasty of left knee 03/03/2018  . Hypertension goal BP (blood pressure) < 130/80 02/26/2018  . Generalized edema 02/26/2018  . Erectile dysfunction 02/26/2018  . Migraine without aura and without status migrainosus, not intractable 02/26/2018  . Elevated AST (SGOT) 02/26/2018  . Chronic midline low back pain without sciatica 01/06/2018  . Benign prostatic hyperplasia with urinary frequency 08/28/2017  . Recurrent major depressive disorder, in partial remission (HCC) 07/21/2017  . Methadone use disorder, mild, abuse (HCC) 04/29/2017  . Metatarsalgia of left foot 04/02/2017  . Chronic insomnia 01/22/2017  . Loose total hip arthroplasty (HCC) 12/31/2016  . Mood disorder with depressive features due to medical condition 11/06/2015  . Post right SI joint fusion 11/06/2015  . Low testosterone 07/25/2014  . Osteoarthritis of hip 07/07/2013  . Chronic pain of right knee 08/10/2008  . Eczema 02/16/2007    Past Surgical History:  Procedure Laterality Date  . APPENDECTOMY    . COLONOSCOPY    . HERNIA REPAIR    . JOINT REPLACEMENT    . OSTEOTOMY    . SHOULDER ARTHROSCOPY Left 06/2018  . TOTAL HIP ARTHROPLASTY Right   . TOTAL KNEE ARTHROPLASTY Left        Home Medications    Prior to Admission medications   Medication Sig Start Date End Date Taking? Authorizing Provider  cyclobenzaprine (FLEXERIL) 5 MG tablet Take 1  tablet (5 mg total) by mouth at bedtime as needed for muscle spasms. 05/07/18   Carlis Stable, PA-C  Eszopiclone (ESZOPICLONE) 3 MG TABS Take 1 tablet (3 mg total) by mouth at bedtime as needed. 11/16/18 11/16/19  Carlis Stable, PA-C  HYDROcodone-acetaminophen (NORCO/VICODIN) 5-325 MG tablet Take 1 tablet by mouth every 8 (eight) hours as needed for moderate pain. 12/02/18   Monica Becton, MD  ibuprofen (ADVIL,MOTRIN) 800 MG tablet Take 1 tablet (800 mg total) by mouth every 8 (eight) hours as needed. 10/05/18   Monica Becton, MD   Multiple Vitamins-Minerals (MULTIVITAL PO) Take by mouth.    [provider]  sildenafil (REVATIO) 20 MG tablet 1-5 tab PO 15 minutes prior to sexual activity as needed 02/24/18   Carlis Stable, PA-C  sulfamethoxazole-trimethoprim (BACTRIM DS,SEPTRA DS) 800-160 MG tablet Take 1 tablet by mouth 2 (two) times daily. 12/02/18   Monica Becton, MD  SUMAtriptan (IMITREX) 50 MG tablet Take 1 tablet (50 mg total) by mouth once as needed for up to 1 dose for migraine. May repeat in 2 hours if headache persists or recurs. 02/24/18   Carlis Stable, PA-C  testosterone cypionate (DEPOTESTOSTERONE CYPIONATE) 200 MG/ML injection Inject into the muscle. 11/02/17   [provider]  zolpidem (AMBIEN CR) 6.25 MG CR tablet Take 1 tablet (6.25 mg total) by mouth at bedtime as needed for sleep. 05/07/18 05/10/18  Carlis Stable, PA-C    Family History Family History  Problem Relation Age of Onset  . Stroke Mother   . Skin cancer Father   . Heart disease Father   . Hypertension Father   . Aneurysm Brother   . Hypertension Sister   . Heart disease Sister   . Cancer Neg Hx   . Heart attack Neg Hx     Social History Social History   Tobacco Use  . Smoking status: Never Smoker  . Smokeless tobacco: Never Used  Substance Use Topics  . Alcohol use: Not Currently  . Drug use: Never     Allergies   Iodinated diagnostic agents; Morphine and related; and Temazepam   Review of Systems Review of Systems  Constitutional: Negative for fatigue and fever.  Eyes: Negative for photophobia, pain, discharge, redness, itching and visual disturbance.  Gastrointestinal: Negative for nausea and vomiting.  Neurological: Negative for headaches.  All other systems reviewed and are negative.    Physical Exam Triage Vital Signs ED Triage Vitals [12/04/18 1422]  Enc Vitals Group     BP (!) 144/83     Pulse Rate 88     Resp 18     Temp 98.9 F (37.2 C)      Temp Source Oral     SpO2 97 %     Weight 185 lb 3 oz (84 kg)     Height  (1.727 m)     Head Circumference      Peak Flow      Pain Score 7     Pain Loc      Pain Edu?      Excl. in GC?    No data found.  Updated Vital Signs BP (!) 144/83 (BP Location: Right Arm)   Pulse 88   Temp 98.9 F (37.2 C) (Oral)   Resp 18   Ht  (1.727 m)   Wt 84 kg   SpO2 97%   BMI 28.16 kg/m   Visual Acuity Right Eye Distance:  Left Eye Distance:   Bilateral Distance:    Right Eye Near:   Left Eye Near:    Bilateral Near:     Physical Exam Vitals signs and nursing note reviewed.  Constitutional:      General: He is not in acute distress. HENT:     Head:      Right Ear: External ear normal.     Left Ear: External ear normal.     Nose: Nasal tenderness present.     Left Nostril: Occlusion present.     Left Sinus: Maxillary sinus tenderness present.      Comments: Left aspect of nose and tip of nose erythematous and tender to palpation.  Tenderness extends to left maxillary sinus area and just inferior to left eye.    Mouth/Throat:     Pharynx: Oropharynx is clear.  Eyes:     General:        Right eye: No discharge.        Left eye: No discharge.     Extraocular Movements: Extraocular movements intact.     Conjunctiva/sclera: Conjunctivae normal.     Pupils: Pupils are equal, round, and reactive to light.  Neck:     Musculoskeletal: Neck supple.  Cardiovascular:     Rate and Rhythm: Normal rate.  Pulmonary:     Effort: Pulmonary effort is normal.  Lymphadenopathy:     Cervical: No cervical adenopathy.  Skin:    General: Skin is warm and dry.  Neurological:     Mental Status: He is alert.      UC Treatments / Results  Labs (all labs ordered are listed, but only abnormal results are displayed) Labs Reviewed - No data to display  EKG None  Radiology No results found.  Procedures Procedures (including critical care time)  Medications Ordered in  UC Medications - No data to display  Initial Impression / Assessment and Plan / UC Course  I have reviewed the triage vital signs and the nursing notes.  Pertinent labs & imaging results that were available during my care of the patient were reviewed by me and considered in my medical decision making (see chart for details).    Patient's nasal and facial cellulitis failed outpatient treatment with Bactrim DS.  Recommend proceeding to [facility] ED for evaluation/treatment.   Final Clinical Impressions(s) / UC Diagnoses   Final diagnoses:  Nasal vestibulitis  Facial cellulitis   Discharge Instructions   None    ED Prescriptions    None         Lattie HawBeese, Hesston Hitchens A, MD 12/04/18 1450

## 2018-12-04 NOTE — ED Triage Notes (Signed)
Patient her for follow up regarding cellulits on left side of nose which he feels is spreading to peri-orbital area. He has had 4 doses of septra rx from 12/02/18; states the rx hydrocodone is ineffective and has not taken this or ibuprofen today. No recent travel; did have influenza vacc this season.

## 2018-12-04 NOTE — ED Notes (Signed)
He feels "much better". He ambulates to b.r. and back without difficulty.

## 2018-12-04 NOTE — ED Triage Notes (Signed)
Pt being treated for cellulitis on nose which has now spread to left eye. Sent by [facility] in [place] to ED for further evaluation since not responding to current antibiotics. Denies visual problems in left eye

## 2018-12-05 DIAGNOSIS — E291 Testicular hypofunction: Secondary | ICD-10-CM | POA: Diagnosis present

## 2018-12-05 DIAGNOSIS — G8929 Other chronic pain: Secondary | ICD-10-CM | POA: Diagnosis present

## 2018-12-05 DIAGNOSIS — Z96652 Presence of left artificial knee joint: Secondary | ICD-10-CM | POA: Diagnosis present

## 2018-12-05 DIAGNOSIS — D72825 Bandemia: Secondary | ICD-10-CM | POA: Diagnosis not present

## 2018-12-05 DIAGNOSIS — N179 Acute kidney failure, unspecified: Secondary | ICD-10-CM | POA: Diagnosis present

## 2018-12-05 DIAGNOSIS — N400 Enlarged prostate without lower urinary tract symptoms: Secondary | ICD-10-CM | POA: Diagnosis present

## 2018-12-05 DIAGNOSIS — Z888 Allergy status to other drugs, medicaments and biological substances status: Secondary | ICD-10-CM | POA: Diagnosis not present

## 2018-12-05 DIAGNOSIS — Z91041 Radiographic dye allergy status: Secondary | ICD-10-CM | POA: Diagnosis not present

## 2018-12-05 DIAGNOSIS — D696 Thrombocytopenia, unspecified: Secondary | ICD-10-CM | POA: Diagnosis present

## 2018-12-05 DIAGNOSIS — L03211 Cellulitis of face: Secondary | ICD-10-CM | POA: Diagnosis not present

## 2018-12-05 DIAGNOSIS — Z823 Family history of stroke: Secondary | ICD-10-CM | POA: Diagnosis not present

## 2018-12-05 DIAGNOSIS — Z8249 Family history of ischemic heart disease and other diseases of the circulatory system: Secondary | ICD-10-CM | POA: Diagnosis not present

## 2018-12-05 DIAGNOSIS — J340 Abscess, furuncle and carbuncle of nose: Secondary | ICD-10-CM | POA: Diagnosis not present

## 2018-12-05 DIAGNOSIS — Z85828 Personal history of other malignant neoplasm of skin: Secondary | ICD-10-CM | POA: Diagnosis not present

## 2018-12-05 DIAGNOSIS — Z96641 Presence of right artificial hip joint: Secondary | ICD-10-CM | POA: Diagnosis present

## 2018-12-05 DIAGNOSIS — Z885 Allergy status to narcotic agent status: Secondary | ICD-10-CM | POA: Diagnosis not present

## 2018-12-05 DIAGNOSIS — F5104 Psychophysiologic insomnia: Secondary | ICD-10-CM | POA: Diagnosis not present

## 2018-12-05 DIAGNOSIS — F329 Major depressive disorder, single episode, unspecified: Secondary | ICD-10-CM | POA: Diagnosis not present

## 2018-12-05 DIAGNOSIS — I10 Essential (primary) hypertension: Secondary | ICD-10-CM | POA: Diagnosis present

## 2018-12-05 DIAGNOSIS — M1990 Unspecified osteoarthritis, unspecified site: Secondary | ICD-10-CM | POA: Diagnosis present

## 2018-12-05 DIAGNOSIS — Z808 Family history of malignant neoplasm of other organs or systems: Secondary | ICD-10-CM | POA: Diagnosis not present

## 2018-12-05 LAB — BASIC METABOLIC PANEL
Anion gap: 13 (ref 5–15)
BUN: 25 mg/dL — ABNORMAL HIGH (ref 6–20)
CO2: 22 mmol/L (ref 22–32)
Calcium: 8.8 mg/dL — ABNORMAL LOW (ref 8.9–10.3)
Chloride: 104 mmol/L (ref 98–111)
Creatinine, Ser: 0.97 mg/dL (ref 0.61–1.24)
GFR calc Af Amer: >60 mL/min (ref >60)
GFR calc non Af Amer: >60 mL/min (ref >60)
Glucose, Bld: 127 mg/dL — ABNORMAL HIGH (ref 70–99)
Potassium: 3.6 mmol/L (ref 3.5–5.1)
Sodium: 139 mmol/L (ref 135–145)

## 2018-12-05 LAB — CBC
HCT: 45.5 % (ref 39.0–52.0)
Hemoglobin: 15.8 g/dL (ref 13.0–17.0)
MCH: 31.5 pg (ref 26.0–34.0)
MCHC: 34.7 g/dL (ref 30.0–36.0)
MCV: 90.8 fL (ref 80.0–100.0)
Platelets: 164 10*3/uL (ref 150–400)
RBC: 5.01 MIL/uL (ref 4.22–5.81)
RDW: 12.9 % (ref 11.5–15.5)
WBC: 11.9 10*3/uL — ABNORMAL HIGH (ref 4.0–10.5)
nRBC: 0 % (ref 0.0–0.2)

## 2018-12-05 LAB — HIV ANTIBODY (ROUTINE TESTING W REFLEX): HIV Screen 4th Generation wRfx: NONREACTIVE

## 2018-12-05 MED ORDER — SODIUM CHLORIDE 0.9 % IV SOLN
INTRAVENOUS | Status: DC
  Administered 2018-12-05 – 2018-12-06 (×3): via INTRAVENOUS

## 2018-12-05 MED ORDER — IBUPROFEN 800 MG PO TABS: 800.0000 mg | ORAL_TABLET | Freq: Once | ORAL | Status: DC

## 2018-12-05 MED ORDER — KETOROLAC TROMETHAMINE 30 MG/ML IJ SOLN
30.0000 mg | Freq: Four times a day (QID) | INTRAMUSCULAR | Status: AC
  Administered 2018-12-05 – 2018-12-08 (×12): 30 mg via INTRAVENOUS
  Filled 2018-12-05 (×12): qty 1

## 2018-12-05 MED ORDER — HYDROMORPHONE HCL 1 MG/ML IJ SOLN
INTRAMUSCULAR | Status: AC
  Administered 2018-12-05: 16:00:00 1 mg via INTRAVENOUS
  Filled 2018-12-05: qty 1

## 2018-12-05 MED ORDER — HYDROMORPHONE HCL 1 MG/ML IJ SOLN
1.0000 mg | INTRAMUSCULAR | Status: DC | PRN
  Administered 2018-12-05 – 2018-12-07 (×5): 1 mg via INTRAVENOUS
  Filled 2018-12-05 (×5): qty 1

## 2018-12-05 MED ORDER — KETOROLAC TROMETHAMINE 30 MG/ML IJ SOLN
30.0000 mg | Freq: Once | INTRAMUSCULAR | Status: AC
  Administered 2018-12-05: 03:00:00 30 mg via INTRAVENOUS
  Filled 2018-12-05: qty 1

## 2018-12-05 MED ORDER — SODIUM CHLORIDE 0.9 % IV SOLN
INTRAVENOUS | Status: DC | PRN
  Administered 2018-12-05: 250 mL via INTRAVENOUS

## 2018-12-05 MED ORDER — POLYETHYLENE GLYCOL 3350 17 G PO PACK
17.0000 g | PACK | Freq: Every day | ORAL | Status: DC
  Administered 2018-12-05 – 2018-12-07 (×3): 17 g via ORAL
  Filled 2018-12-05 (×3): qty 1

## 2018-12-05 MED ORDER — HYDROMORPHONE HCL 1 MG/ML IJ SOLN
0.5000 mg | Freq: Once | INTRAMUSCULAR | Status: AC
  Administered 2018-12-05: 0.5 mg via INTRAVENOUS
  Filled 2018-12-05: qty 0.5

## 2018-12-05 MED ORDER — OXYCODONE HCL 5 MG PO TABS
2.5000 mg | ORAL_TABLET | ORAL | Status: DC | PRN
  Administered 2018-12-05 – 2018-12-08 (×9): 2.5 mg via ORAL
  Filled 2018-12-05 (×9): qty 1

## 2018-12-05 MED ORDER — OXYCODONE-ACETAMINOPHEN 5-325 MG PO TABS
1.0000 | ORAL_TABLET | ORAL | Status: DC | PRN
  Administered 2018-12-05 – 2018-12-08 (×10): 1 via ORAL
  Filled 2018-12-05 (×9): qty 1

## 2018-12-05 MED ORDER — OXYCODONE-ACETAMINOPHEN 7.5-325 MG PO TABS
1.0000 | ORAL_TABLET | ORAL | Status: DC | PRN
  Filled 2018-12-05: qty 1

## 2018-12-05 NOTE — Progress Notes (Addendum)
[facility] PROGRESS NOTE  [name] RWP:[id number] DOB: 18-Sep-1965 DOA: 12/04/2018 PCP: Carlis Stable, PA-C   Narrative: 44 ? 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  Failed outpatient treatment with p.o. Bactrim. -Status post clindamycin in ED. -Unasyn 600 mg every 6 hours for broader coverage -PRN Toradol q6 -IVF 100 cc/h -reg diet -call ENT if worse--nursing took picture of area of swelling and will compare and let me know if looks worse by PM  Leukocytosis: Likely due to the above. -Continue trending  Chronic insomnia: Lunesta at home. -Ambien at bedtime as needed  Thrombocytopenia/chronic pain/depression: Stable. underlying TCP  should be worked up as OP if not already   AKI Start fluids as above, labs am    H/o Testosoterone Replacement Caution and clarity re:: risks and benefits to be doiscussed with patient by PCP--no overt evidence Andropause is helped, and has to be balanced with risk VTW  DVT lovenox  Code Status: full  Communication: non  Disposition Plan: unclear    Reighn Kaplan, MD  [facility] Via [employer] app OR -[email or web address] 7PM-7AM contact night coverage as above 12/05/2018, 8:08 AM  LOS: 0 days   Consultants:  None yet  Procedures:  n  Antimicrobials:  Unasyn  Interval history/Subjective: Awake alert in nad In pain-no meds given since last pm? 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 enoxaparin (LOVENOX) injection  40 mg Subcutaneous Q24H  . ketorolac  30 mg Intravenous Q6H   Continuous Infusions: . sodium chloride 250 mL (12/05/18 0409)  . ampicillin-sulbactam (UNASYN) IV 3 g (12/05/18 0411)    Principal Problem:   Facial cellulitis Active Problems:   Chronic insomnia   Thrombocytopenia (HCC)   Leukocytosis   Depression   LOS: 0 days

## 2018-12-06 LAB — COMPREHENSIVE METABOLIC PANEL
ALT: 26 U/L (ref 0–44)
AST: 31 U/L (ref 15–41)
Albumin: 3.6 g/dL (ref 3.5–5.0)
Alkaline Phosphatase: 69 U/L (ref 38–126)
Anion gap: 6 (ref 5–15)
BUN: 17 mg/dL (ref 6–20)
CO2: 26 mmol/L (ref 22–32)
Calcium: 8.4 mg/dL — ABNORMAL LOW (ref 8.9–10.3)
Chloride: 107 mmol/L (ref 98–111)
Creatinine, Ser: 0.71 mg/dL (ref 0.61–1.24)
GFR calc Af Amer: >60 mL/min (ref >60)
GFR calc non Af Amer: >60 mL/min (ref >60)
Glucose, Bld: 88 mg/dL (ref 70–99)
Potassium: 4.2 mmol/L (ref 3.5–5.1)
Sodium: 139 mmol/L (ref 135–145)
Total Bilirubin: 0.4 mg/dL (ref 0.3–1.2)
Total Protein: 6.4 g/dL — ABNORMAL LOW (ref 6.5–8.1)

## 2018-12-06 LAB — CBC WITH DIFFERENTIAL/PLATELET
Abs Immature Granulocytes: 0.06 10*3/uL (ref 0.00–0.07)
Basophils Absolute: 0 10*3/uL (ref 0.0–0.1)
Basophils Relative: 0 %
Eosinophils Absolute: 0.1 10*3/uL (ref 0.0–0.5)
Eosinophils Relative: 1 %
HCT: 42 % (ref 39.0–52.0)
Hemoglobin: 14.1 g/dL (ref 13.0–17.0)
Immature Granulocytes: 1 %
Lymphocytes Relative: 19 %
Lymphs Abs: 1.4 10*3/uL (ref 0.7–4.0)
MCH: 30.7 pg (ref 26.0–34.0)
MCHC: 33.6 g/dL (ref 30.0–36.0)
MCV: 91.5 fL (ref 80.0–100.0)
Monocytes Absolute: 0.8 10*3/uL (ref 0.1–1.0)
Monocytes Relative: 10 %
Neutro Abs: 5.1 10*3/uL (ref 1.7–7.7)
Neutrophils Relative %: 69 %
Platelets: 130 10*3/uL — ABNORMAL LOW (ref 150–400)
RBC: 4.59 MIL/uL (ref 4.22–5.81)
RDW: 12.8 % (ref 11.5–15.5)
WBC: 7.5 10*3/uL (ref 4.0–10.5)
nRBC: 0 % (ref 0.0–0.2)

## 2018-12-06 NOTE — Consult Note (Signed)
Reason for Consult: Nasal infection Referring Physician: Hospitalist  Earl [name] is an 53 y.o. male.  HPI: 53 year old male noticed a bump inside the left nasal passage about five days ago and scratched at it.  A couple of days later, the left side of the nose started swelling and the passage swelled closed.  He was started on Bactrim by his PCP but the problem worsened.  He came to the ER two days ago where a CT scan was done and he was admitted and treated with Unasyn.  Symptoms have not improved and consult was requested.  Past Medical History:  Diagnosis Date  . Acute urinary retention   . Arthritis   . Depression   . Hemorrhoids   . History of skin cancer in adulthood 2012   chest wall  . Hypertension   . Hypogonadism, male   . Insomnia     Past Surgical History:  Procedure Laterality Date  . APPENDECTOMY    . COLONOSCOPY    . HERNIA REPAIR    . JOINT REPLACEMENT    . OSTEOTOMY    . SHOULDER ARTHROSCOPY Left 06/2018  . TOTAL HIP ARTHROPLASTY Right   . TOTAL KNEE ARTHROPLASTY Left     Family History  Problem Relation Age of Onset  . Stroke Mother   . Skin cancer Father   . Heart disease Father   . Hypertension Father   . Aneurysm Brother   . Hypertension Sister   . Heart disease Sister   . Cancer Neg Hx   . Heart attack Neg Hx     Social History:  reports that he has never smoked. He has never used smokeless tobacco. He reports previous alcohol use. He reports that he does not use drugs.  Allergies:  Allergies  Allergen Reactions  . Iodinated Diagnostic Agents Swelling    N/V - rash- red skin   . Morphine And Related Other (See Comments)    Irritability   . Temazepam Other (See Comments)    Mood change, irritability    Medications: I have reviewed the patient's current medications.  Results for orders placed or performed during the hospital encounter of 12/04/18 (from the past 48 hour(s))  CBC with Differential     Status: Abnormal   Collection  Time: 12/04/18  4:35 PM  Result Value Ref Range   WBC 14.4 (H) 4.0 - 10.5 K/uL   RBC 5.39 4.22 - 5.81 MIL/uL   Hemoglobin 16.9 13.0 - 17.0 g/dL   HCT 16.148.3 09.639.0 - 04.552.0 %   MCV 89.6 80.0 - 100.0 fL   MCH 31.4 26.0 - 34.0 pg   MCHC 35.0 30.0 - 36.0 g/dL   RDW 40.912.6 81.111.5 - 91.415.5 %   Platelets 149 (L) 150 - 400 K/uL   nRBC 0.0 0.0 - 0.2 %   Neutrophils Relative % 87 %   Neutro Abs 12.4 (H) 1.7 - 7.7 K/uL   Lymphocytes Relative 6 %   Lymphs Abs 0.9 0.7 - 4.0 K/uL   Monocytes Relative 7 %   Monocytes Absolute 1.1 (H) 0.1 - 1.0 K/uL   Eosinophils Relative 0 %   Eosinophils Absolute 0.0 0.0 - 0.5 K/uL   Basophils Relative 0 %   Basophils Absolute 0.0 0.0 - 0.1 K/uL   Immature Granulocytes 0 %   Abs Immature Granulocytes 0.06 0.00 - 0.07 K/uL    Comment: Performed at [facility], [address] [place], [place] [id number]  Basic metabolic  panel     Status: Abnormal   Collection Time: 12/04/18  4:35 PM  Result Value Ref Range   Sodium 138 135 - 145 mmol/L   Potassium 3.8 3.5 - 5.1 mmol/L   Chloride 104 98 - 111 mmol/L   CO2 23 22 - 32 mmol/L   Glucose, Bld 98 70 - 99 mg/dL   BUN 26 (H) 6 - 20 mg/dL   Creatinine, Ser 1.61 0.61 - 1.24 mg/dL   Calcium 9.1 8.9 - 09.6 mg/dL   GFR calc non Af Amer >60 >60 mL/min   GFR calc Af Amer >60 >60 mL/min   Anion gap 11 5 - 15    Comment: Performed at [facility], [address], [place], [place] [address]  HIV antibody (Routine Testing)     Status: None   Collection Time: 12/04/18 10:12 PM  Result Value Ref Range   HIV Screen 4th Generation wRfx Non Reactive Non Reactive    Comment: (NOTE) Performed At: [facility] [address] [place], [place] [id number] Jolene Schimke MD [id number]   CBC     Status: Abnormal   Collection Time: 12/04/18 10:12 PM  Result Value Ref Range   WBC 15.3 (H) 4.0 - 10.5 K/uL   RBC 5.14 4.22 - 5.81 MIL/uL   Hemoglobin 16.0 13.0 - 17.0 g/dL   HCT 84.6 96.2 - 95.2  %   MCV 90.7 80.0 - 100.0 fL   MCH 31.1 26.0 - 34.0 pg   MCHC 34.3 30.0 - 36.0 g/dL   RDW 84.1 32.4 - 40.1 %   Platelets 155 150 - 400 K/uL   nRBC 0.0 0.0 - 0.2 %    Comment: Performed at [facility], [address] [place], [place] [address]  Creatinine, serum     Status: None   Collection Time: 12/04/18 10:12 PM  Result Value Ref Range   Creatinine, Ser 0.80 0.61 - 1.24 mg/dL   GFR calc non Af Amer >60 >60 mL/min   GFR calc Af Amer >60 >60 mL/min    Comment: Performed at [facility], [address] [place], [place] [address]  Basic metabolic panel     Status: Abnormal   Collection Time: 12/05/18  3:26 AM  Result Value Ref Range   Sodium 139 135 - 145 mmol/L   Potassium 3.6 3.5 - 5.1 mmol/L   Chloride 104 98 - 111 mmol/L   CO2 22 22 - 32 mmol/L   Glucose, Bld 127 (H) 70 - 99 mg/dL   BUN 25 (H) 6 - 20 mg/dL   Creatinine, Ser 0.34 0.61 - 1.24 mg/dL   Calcium 8.8 (L) 8.9 - 10.3 mg/dL   GFR calc non Af Amer >60 >60 mL/min   GFR calc Af Amer >60 >60 mL/min   Anion gap 13 5 - 15    Comment: Performed at [facility], [address] [place], [place] [address]  CBC     Status: Abnormal   Collection Time: 12/05/18  3:26 AM  Result Value Ref Range   WBC 11.9 (H) 4.0 - 10.5 K/uL   RBC 5.01 4.22 - 5.81 MIL/uL   Hemoglobin 15.8 13.0 - 17.0 g/dL   HCT 56.3 87.5 - 64.3 %   MCV 90.8 80.0 - 100.0 fL   MCH 31.5 26.0 - 34.0 pg   MCHC 34.7 30.0 - 36.0 g/dL   RDW 32.9 51.8 - 84.1 %   Platelets 164 150 - 400  K/uL   nRBC 0.0 0.0 - 0.2 %    Comment: Performed at [facility], [address] [place], [place] [address]  Comprehensive metabolic panel     Status: Abnormal   Collection Time: 12/06/18  3:02 AM  Result Value Ref Range   Sodium 139 135 - 145 mmol/L   Potassium 4.2 3.5 - 5.1 mmol/L   Chloride 107 98 - 111 mmol/L   CO2 26 22 - 32 mmol/L   Glucose, Bld 88 70 - 99 mg/dL   BUN 17 6 - 20 mg/dL    Creatinine, Ser 6.04 0.61 - 1.24 mg/dL   Calcium 8.4 (L) 8.9 - 10.3 mg/dL   Total Protein 6.4 (L) 6.5 - 8.1 g/dL   Albumin 3.6 3.5 - 5.0 g/dL   AST 31 15 - 41 U/L   ALT 26 0 - 44 U/L   Alkaline Phosphatase 69 38 - 126 U/L   Total Bilirubin 0.4 0.3 - 1.2 mg/dL   GFR calc non Af Amer >60 >60 mL/min   GFR calc Af Amer >60 >60 mL/min   Anion gap 6 5 - 15    Comment: Performed at [facility], [address], [place], [place] [address]  CBC with Differential/Platelet     Status: Abnormal   Collection Time: 12/06/18  3:02 AM  Result Value Ref Range   WBC 7.5 4.0 - 10.5 K/uL   RBC 4.59 4.22 - 5.81 MIL/uL   Hemoglobin 14.1 13.0 - 17.0 g/dL   HCT 11.9 14.7 - 82.9 %   MCV 91.5 80.0 - 100.0 fL   MCH 30.7 26.0 - 34.0 pg   MCHC 33.6 30.0 - 36.0 g/dL   RDW 56.2 13.0 - 86.5 %   Platelets 130 (L) 150 - 400 K/uL   nRBC 0.0 0.0 - 0.2 %   Neutrophils Relative % 69 %   Neutro Abs 5.1 1.7 - 7.7 K/uL   Lymphocytes Relative 19 %   Lymphs Abs 1.4 0.7 - 4.0 K/uL   Monocytes Relative 10 %   Monocytes Absolute 0.8 0.1 - 1.0 K/uL   Eosinophils Relative 1 %   Eosinophils Absolute 0.1 0.0 - 0.5 K/uL   Basophils Relative 0 %   Basophils Absolute 0.0 0.0 - 0.1 K/uL   Immature Granulocytes 1 %   Abs Immature Granulocytes 0.06 0.00 - 0.07 K/uL    Comment: Performed at [facility], [address] [place], [place] [address]    Ct Maxillofacial W Contrast  Result Date: 12/04/2018 CLINICAL DATA:  Left facial and nasal swelling and tenderness. Cellulitis. EXAM: CT MAXILLOFACIAL WITH CONTRAST TECHNIQUE: Multidetector CT imaging of the maxillofacial structures was performed with intravenous contrast. Multiplanar CT image reconstructions were also generated. CONTRAST:  OMNIPAQUE IOHEXOL 300 MG/ML  SOLN COMPARISON:  None. FINDINGS: Osseous: No acute fracture or destructive osseous process. Orbits: The globes appear intact. No postseptal inflammation. Sinuses: Minimal left  maxillary sinus mucosal thickening. No sinus fluid. Clear mastoid air cells. Soft tissues: There is asymmetric soft tissue thickening involving the left lateral aspect of the nose extending inferiorly to the ala. There is some mild hypoattenuation within the area of greatest swelling, however a drainable fluid collection is not evident. There is mild swelling extending more laterally into the soft tissues anterior to the maxillary sinus just inferior to the orbit. Limited intracranial: Unremarkable. IMPRESSION: Left-sided nasal and facial swelling consistent with cellulitis. No abscess. Electronically Signed   By: Sebastian Ache  M.D.   On: 12/04/2018 21:05    Review of Systems  All other systems reviewed and are negative.  Blood pressure 139/85, pulse (!) 58, temperature 98.3 F (36.8 C), temperature source Oral, resp. rate 18, height 5\' 8"  (1.727 m), weight 84.7 kg, SpO2 97 %. Physical Exam  Constitutional: He is oriented to person, place, and time. He appears well-developed and well-nourished. No distress.  HENT:  Head: Normocephalic and atraumatic.  Right Ear: External ear normal.  Left Ear: External ear normal.  Mouth/Throat: Oropharynx is clear and moist.  Left lateral external nasal wall with rounded fullness.  Left lateral internal nasal wall with rounded fullness and small area of purulent drainage.  Eyes: Pupils are equal, round, and reactive to light. Conjunctivae and EOM are normal.  Neck: Normal range of motion. Neck supple.  Cardiovascular: Normal rate.  Respiratory: Effort normal.  Neurological: He is alert and oriented to person, place, and time. No cranial nerve deficit.  Skin: Skin is warm and dry.  Psychiatric: He has a normal mood and affect. His behavior is normal. Judgment and thought content normal.    Assessment/Plan: Left nasal wall abscess I personally reviewed his maxillofacial CT from 4/11 demonstrating a fluid collection in the left latera nasal wall.  This is  prominent on exam today.  I recommended and performed bedside incision and drainage.  See procedure note.  A strip of iodoform gauze was left coming from the intranasal wound that he can remove on Thursday.  A culture was sent.  Continue IV antibiotics until he is improving clinically and then can be discharged on oral antibiotic, culture-directed if possible.  He can follow-up with me as needed.  Christia Reading 12/06/2018, 2:13 PM

## 2018-12-06 NOTE — Procedures (Signed)
Preop diagnosis: Left lateral wall nasal abscess Postop diagnosis: same Procedure: Incision and drainage left lateral nasal wall Surgeon: Jenne Pane Anesth: Local with 2% lidocaine with 1:200,000 epinephrine Compl: None Findings: Abscess of left lateral nasal wall, culture sent. Description:  After discussing risks, benefits, and alternatives, the patient was placed in a reclined position.  The left lateral internal nasal wall was injected with local anesthetic.  A 15 blade scalpel was used to make an incision intranasally.  The incision was probed with a hemostat and pus drained.  The abscess was milked of more pus.  A culture was obtained.  A strip of iodoform nu-gauze was then placed into the abscess, coming out of the incision within the nose.  He was returned to nursing care in stable condition.

## 2018-12-06 NOTE — Progress Notes (Signed)
[facility] PROGRESS NOTE  Earl [name] [id number] DOB: [birth year] DOA: 12/04/2018 PCP: Carlis Stable, PA-C   Narrative: 29 ? 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 -Continue trending--improved  Chronic insomnia: Lunesta at home. -Ambien at bedtime as needed  Thrombocytopenia/chronic pain/depression: Stable. underlying TCP  should be worked up as OP if not already   AKI Start fluids as above, labs am    H/o Testosoterone Replacement Caution and clarity re:: risks and benefits to be doiscussed with patient by PCP--no overt evidence Andropause is helped, and has to be balanced with risk VTW  DVT lovenox  Code Status: full  Communication: non  Disposition Plan: unclear    Willoughby Doell, MD  [facility] Via [employer] app OR [email or web address] 7PM-7AM contact night coverage as above 12/06/2018, 2:39 PM  LOS: 1 day   Consultants:  None yet  Procedures:  n  Antimicrobials:  Unasyn  Interval history/Subjective:  Pain ++ Only toradol helps More swollen today  Objective:  Vitals:  Vitals:   12/05/18 2129 12/06/18 0545  BP: 131/88 139/85  Pulse: 63 (!) 58  Resp: 18 18  Temp: 98 F (36.7 C) 98.3 F (36.8 C)  SpO2: 99% 97%    Exam:   awake alert in pain Obvious L maxillary swllling>R and now some point tenderness cta b abd soft No le edema   I have personally reviewed the following:  DATA   Labs: Wbc 15-->11-->7.5 BUN /creat still up 25/0.9-->17/0.7   Scheduled Meds: . enoxaparin (LOVENOX) injection  40 mg Subcutaneous Q24H   . ketorolac  30 mg Intravenous Q6H  . polyethylene glycol  17 g Oral Daily   Continuous Infusions: . sodium chloride 250 mL (12/05/18 0409)  . sodium chloride 100 mL/hr at 12/06/18 0820  . ampicillin-sulbactam (UNASYN) IV 3 g (12/06/18 0953)    Principal Problem:   Facial cellulitis Active Problems:   Chronic insomnia   Thrombocytopenia (HCC)   Leukocytosis   Depression   LOS: 1 day

## 2018-12-07 NOTE — Progress Notes (Signed)
[facility] PROGRESS NOTE  Earl [name] [id number] DOB: October 12, 1965 DOA: 12/04/2018 PCP: Earl Stable, PA-C   Narrative: 67 ? 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 -Continue trending--improved  Chronic insomnia: Lunesta at home. -Ambien at bedtime as needed  Thrombocytopenia/chronic pain/depression: [name]. underlying TCP  should be worked up as OP if not already   AKI Start fluids as above, labs am    H/o Testosoterone Replacement Patient sees endocrinologist Dr. Ellan [name] I had an extensive discussion with him regarding risk benefits of testosterone I explained chronic pain/depression in terms of altering HPA access and potentially lowering testosterone He has already heard of these theories from his endocrinologist-I have advocated to him that I would recommend coming off of testosterone replacement unless he absolutely needs it  DVT lovenox  Code Status: full  Communication: non  Disposition Plan: unclear    Earl Vazguez, MD  [facility] Via [employer] app OR [email or web address] 7PM-7AM contact night coverage as above 12/07/2018, 11:25 AM  LOS: 2 days   Consultants:  None yet  Procedures:  n  Antimicrobials:  Unasyn  Interval history/Subjective:  Awake alert pleasant no distress pain is still present in the face however it is improved overall He is asking if he can get up and walk around which have no problem with He can also go off the floor   Objective:  Vitals:  Vitals:   12/06/18 2047 12/07/18  0447  BP: (!) 157/85 137/75  Pulse: 61 (!) 57  Resp: 16 16  Temp: 98 F (36.7 C) 98.5 F (36.9 C)  SpO2: 100% 97%    Exam:  Awake pleasant alert swelling over the left alla of the left nostril is still present it does look somewhat tender he is having some drainage out of his nose No icterus Chest clear Abdomen soft No lower extremity edema   I have personally reviewed the following:  DATA   Labs: Labs from this a.m. are pending   Scheduled Meds: . ketorolac  30 mg Intravenous Q6H  . polyethylene glycol  17 g Oral Daily   Continuous Infusions: . sodium chloride 250 mL (12/05/18 0409)  . sodium chloride 40 mL/hr at 12/07/18 0157  . ampicillin-sulbactam (UNASYN) IV 3 g (12/07/18 0934)    Principal Problem:   Facial cellulitis Active Problems:   Chronic insomnia   Thrombocytopenia (HCC)   Leukocytosis   Depression   LOS: 2 days

## 2018-12-08 DIAGNOSIS — F329 Major depressive disorder, single episode, unspecified: Secondary | ICD-10-CM

## 2018-12-08 LAB — CBC WITH DIFFERENTIAL/PLATELET
Abs Immature Granulocytes: 0.01 10*3/uL (ref 0.00–0.07)
Basophils Absolute: 0 10*3/uL (ref 0.0–0.1)
Basophils Relative: 1 %
Eosinophils Absolute: 0.1 10*3/uL (ref 0.0–0.5)
Eosinophils Relative: 2 %
HCT: 46.4 % (ref 39.0–52.0)
Hemoglobin: 15.3 g/dL (ref 13.0–17.0)
Immature Granulocytes: 0 %
Lymphocytes Relative: 29 %
Lymphs Abs: 1.5 10*3/uL (ref 0.7–4.0)
MCH: 29.9 pg (ref 26.0–34.0)
MCHC: 33 g/dL (ref 30.0–36.0)
MCV: 90.6 fL (ref 80.0–100.0)
Monocytes Absolute: 0.5 10*3/uL (ref 0.1–1.0)
Monocytes Relative: 9 %
Neutro Abs: 3 10*3/uL (ref 1.7–7.7)
Neutrophils Relative %: 59 %
Platelets: 158 10*3/uL (ref 150–400)
RBC: 5.12 MIL/uL (ref 4.22–5.81)
RDW: 12.2 % (ref 11.5–15.5)
WBC: 5 10*3/uL (ref 4.0–10.5)
nRBC: 0 % (ref 0.0–0.2)

## 2018-12-08 LAB — COMPREHENSIVE METABOLIC PANEL
ALT: 33 U/L (ref 0–44)
AST: 33 U/L (ref 15–41)
Albumin: 3.7 g/dL (ref 3.5–5.0)
Alkaline Phosphatase: 70 U/L (ref 38–126)
Anion gap: 7 (ref 5–15)
BUN: 18 mg/dL (ref 6–20)
CO2: 28 mmol/L (ref 22–32)
Calcium: 8.7 mg/dL — ABNORMAL LOW (ref 8.9–10.3)
Chloride: 103 mmol/L (ref 98–111)
Creatinine, Ser: 0.86 mg/dL (ref 0.61–1.24)
GFR calc Af Amer: >60 mL/min (ref >60)
GFR calc non Af Amer: >60 mL/min (ref >60)
Glucose, Bld: 88 mg/dL (ref 70–99)
Potassium: 4.1 mmol/L (ref 3.5–5.1)
Sodium: 138 mmol/L (ref 135–145)
Total Bilirubin: 0.7 mg/dL (ref 0.3–1.2)
Total Protein: 6.5 g/dL (ref 6.5–8.1)

## 2018-12-08 MED ORDER — DOXYCYCLINE HYCLATE 100 MG PO TABS: 100.0000 mg | ORAL_TABLET | Freq: Two times a day (BID) | ORAL | 0 refills | Status: DC

## 2018-12-08 NOTE — Discharge Instructions (Signed)
Nasal Abscess  A nasal abscess is an infection that causes pus to build up between the bone or cartilage that separates one side of the nose from the other (nasal septum). The front part of the nasal septum is cartilage, and the back part is bone. A nasal abscess causes pain and stuffiness (nasal congestion). It can affect one or both sides of the nose.  This condition is dangerous because the infection can spread to the brain or the eye. It can also cause the septum to collapse and result in a deformed nose (saddle nose). Treatment for a nasal abscess needs to start as soon as the diagnosis is made.  What are the causes?  This condition is caused by an infection with bacteria. A common type of skin bacteria (staphylococcus) is the most common cause, but other bacteria may also cause the infection. A nasal abscess may result from:  · An injury to the nasal septum that causes bleeding between the covering layer of the septal cartilage or the bony septum (septal hematoma). Bacteria from inside the nose can enter the hematoma and cause the infection that leads to an abscess.  · An object inserted into the nose (foreign body).  · An infection that spreads to the septum from another area of the body, such as a sinus infection, dental infection, or an infected hair follicle in the nose (furuncle).  What increases the risk?  You are more likely to develop this condition if:  · You have diabetes.  · Your body's defense system (immune system) is weak.  · You have nasal surgery.  What are the signs or symptoms?  Symptoms of this condition include:  · Not being able to breathe through your nose (nasal obstruction). This is the main symptom.  · Nose pain.  · Swelling and redness of the outer nose.  · Pain when touching the nose (tenderness).  · Fever.  · Headache.  How is this diagnosed?  This condition may be diagnosed based on:  · Your symptoms and medical history.  · A physical exam. During the exam, your health care  provider will use an instrument (nasal speculum) to look inside your nose and check for a soft red or blue swelling that is blocking one or both sides of your nose.  · Tests, such as:  ? CT scans of the nose and sinuses.  ? Blood tests to check for signs of infection.  ? A lab test on a sample of pus to check what kind of bacteria is causing the infection and determine what antibiotic medicine will be most effective (culture and sensitivity test).  How is this treated?  Treatment needs to start right away to prevent complications. Treatment may include:  · Antibiotic medicine given through an IV inserted into one of your veins.  ? You may be started on antibiotics that are effective against staphylococcus and other common bacteria (broad spectrum antibiotics).  ? You may also get a combination of antibiotics.  · A procedure to drain the abscess surgically (incision and drainage) and test the pus for bacteria. The pus may be sent for a culture and sensitivity test. You may have a type of bandage (packing) placed in your nose after the abscess is drained.  · Antibiotics to be taken by mouth (orally). You may be sent home with this medicine after the abscess is drained and the infection is controlled with IV antibiotics. Your antibiotic may be switched if the results of your culture   and sensitivity test indicate another antibiotic would be more effective.  Follow these instructions at home:    · Take your antibiotic medicine as told by your health care provider. Do not stop taking the antibiotic even if you start to feel better.  · Take over-the-counter and prescription medicines only as told by your health care provider.  · Do not drive or use heavy machinery while taking prescription pain medicine.  · Return to your normal activities as told by your health care provider. Ask your health care provider what activities are safe for you.  · If you have a nasal packing, ask your health care provider when you should come  back to have the packing removed.  · Keep all follow-up visits as told by your health care provider. This is important.  Contact a health care provider if:  · You have chills or a fever.  · You have any signs or symptoms of the nasal abscess coming back.  · You have any blood or pus draining from your nose.  · You develop a deformity of your nose.  Get help right away if:  · You have a severe headache, stiff neck, or eye pain.  · You have a sudden change in vision.  Summary  · A nasal abscess is an infection that causes a collection of pus near the bone or cartilage that separates one side of the nose from the other (nasal septum).  · A nasal abscess may result from an injury to the septum that caused bleeding that collected (hematoma) and became infected.  · Common symptoms of this condition include nasal pain and not being able to breathe through your nose.  · Treatment needs to be started right away to prevent complications. Treatment may include IV antibiotics as well as incision and drainage.  · Get help right away if you have a severe headache, stiff neck, eye pain, or a sudden change in vision.  This information is not intended to replace advice given to you by your health care provider. Make sure you discuss any questions you have with your health care provider.  Document Released: 02/16/2018 Document Revised: 02/16/2018 Document Reviewed: 02/16/2018  [employer]vier Interactive Patient Education © 2019 [employer]

## 2018-12-08 NOTE — Discharge Summary (Addendum)
Physician Discharge Summary  Earl [name] GEX:[id number] DOB: 07-04-1966 DOA: 12/04/2018  PCP: Carlis Stableummings, Earl Elizabeth, PA-C  Admit date: 12/04/2018 Discharge date: 12/08/2018  Time spent: 45 minutes  Recommendations for Outpatient Follow-up:  Patient will be discharged to home.  Patient will need to follow up with primary care provider within one week of discharge.  Follow up with Dr. Jenne PaneBates, ENT, if needed. Patient should continue medications as prescribed.  Patient should follow a regular diet.   Discharge Diagnoses:  Principal Problem:   Facial cellulitis Active Problems:   Chronic insomnia   Thrombocytopenia (HCC)   Leukocytosis   Depression  Discharge Condition: Stable  Diet recommendation: regular  Filed Weights   12/04/18 2343  Weight: 84.7 kg    History of present illness:  On 12/04/2018 by Earl [name] is a 53 y.o. male with history of insomnia, BPH, thrombocytopenia and chronic pain presenting with left node swelling and pain.  Noticed what he called a "bump" in his left nose about 4 days ago which was tender to touch.  Two days later, the swelling gotten worse and his left nose was shut.  He went to his PCP and was given a prescription for Bactrim.  He woke up this morning with more swelling in his left face.  He also noted that his left eye felt heavy.  He presented to urgent care and sent to ED. Denies prior history of this.  Denies URI symptoms, sore throat, fever, chills, eye pain, headache, chest pain, dyspnea, nausea, vomiting, abdominal pain, diarrhea or dysuria. He reports taking his Bactrim as prescribed.  Denies history of diabetes.  He denies smoking cigarettes, drinking alcohol recreational drug use.  Denies snorting drugs.  Hospital Course:  Facial cellulitis, intranasal abscess -Noted on maxillofacial CT -ENT consulted and appreciated status post incision and drainage -Patient initially placed on unasyn -Culture shows  few staph aureus -leukocytosis trending downward from 15 to 5 -Discussed with pharmacy, given cultures will discharge with Doxycycline 100mg  BID x 7 days -Patient to follow up with Dr. Jenne PaneBates/ ENT as needed. Patient should pull the nasal packing out on 12/09/2018. If any issues arise, he can call the ENT office. He may use saline to keep nasal passage moist. ADDENDUM: Culture returned MRSA, Zyvox called into patient's pharmacy. Patient notified.   Leukocytosis -Secondary to the above, resolved  Chronic insomnia -Continue home meds on discharge  Thrombocytopenia -platelets appear to be normal  Chronic pain/depression -continue home medications on discharge  Testosterone replacement -Follow-up with endocrinologist Dr. Ellan LambertMichael [name] -Previous hospitalist discussed chronic pain depression terms of HPA access and potentially lowering testosterone  Procedures: Incision and drainage left lateral nasal wall  Consultations: ENT, Dr. Jenne PaneBates  Discharge Exam: Vitals:   12/07/18 2134 12/08/18 0556  BP: (!) 147/87 118/82  Pulse: 62 (!) 53  Resp: 18 13  Temp: 98.7 F (37.1 C) (!) 97.5 F (36.4 C)  SpO2: 99% 98%     General: Well developed, well nourished, NAD, appears stated age  HEENT: NCAT, mucous membranes moist. Nasal packing, left nostril  Neck: Supple  Cardiovascular: S1 S2 auscultated, no rubs, murmurs or gallops. Regular rate and rhythm.  Respiratory: Clear to auscultation bilaterally with equal chest rise  Abdomen: Soft, nontender, nondistended, + bowel sounds  Extremities: warm dry without cyanosis clubbing or edema  Neuro: AAOx3, nonfocal  Psych: Normal affect and demeanor  Discharge Instructions Discharge Instructions    Discharge instructions   Complete by:  As directed  Patient will be discharged to home.  Patient will need to follow up with primary care provider within one week of discharge.  Follow up with Dr. Jenne Pane, ENT, if needed. Patient should  continue medications as prescribed.  Patient should follow a regular diet.     Allergies as of 12/08/2018      Reactions   Iodinated Diagnostic Agents Swelling   N/V - rash- red skin    Morphine And Related Other (See Comments)   Irritability   Temazepam Other (See Comments)   Mood change, irritability      Medication List    STOP taking these medications   sulfamethoxazole-trimethoprim 800-160 MG tablet Commonly known as:  BACTRIM DS,SEPTRA DS     TAKE these medications   cyclobenzaprine 5 MG tablet Commonly known as:  FLEXERIL Take 1 tablet (5 mg total) by mouth at bedtime as needed for muscle spasms.   doxycycline 100 MG tablet Commonly known as:  VIBRA-TABS Take 1 tablet (100 mg total) by mouth 2 (two) times daily. Take with food.   Eszopiclone 3 MG Tabs Commonly known as:  eszopiclone Take 1 tablet (3 mg total) by mouth at bedtime as needed. What changed:  when to take this   HYDROcodone-acetaminophen 5-325 MG tablet Commonly known as:  NORCO/VICODIN Take 1 tablet by mouth every 8 (eight) hours as needed for moderate pain.   ibuprofen 800 MG tablet Commonly known as:  ADVIL,MOTRIN Take 1 tablet (800 mg total) by mouth every 8 (eight) hours as needed. What changed:  reasons to take this   MULTIVITAL PO Take 1 tablet by mouth daily.   sildenafil 20 MG tablet Commonly known as:  REVATIO 1-5 tab PO 15 minutes prior to sexual activity as needed What changed:    how much to take  how to take this  when to take this  reasons to take this  additional instructions   SUMAtriptan 50 MG tablet Commonly known as:  IMITREX Take 1 tablet (50 mg total) by mouth once as needed for up to 1 dose for migraine. May repeat in 2 hours if headache persists or recurs.   testosterone cypionate 200 MG/ML injection Commonly known as:  DEPOTESTOSTERONE CYPIONATE Inject 200 mg into the muscle every 14 (fourteen) days.      Allergies  Allergen Reactions  . Iodinated  Diagnostic Agents Swelling    N/V - rash- red skin   . Morphine And Related Other (See Comments)    Irritability   . Temazepam Other (See Comments)    Mood change, irritability   Follow-up Information    Carlis Stable, PA-C. Schedule an appointment as soon as possible for a visit in 1 week(s).   Specialty:  Physician Assistant Why:  Hospital follow up Contact information: [address] [place] [address] [phone]        Christia Reading, MD. Schedule an appointment as soon as possible for a visit in 1 week(s).   Specialty:  Otolaryngology Why:  Hospital follow up - if needed Contact information: [address] [place] [address] [phone]            The results of significant diagnostics from this hospitalization (including imaging, microbiology, ancillary and laboratory) are listed below for reference.    Significant Diagnostic Studies: Ct Maxillofacial W Contrast  Result Date: 12/04/2018 CLINICAL DATA:  Left facial and nasal swelling and tenderness. Cellulitis. EXAM: CT MAXILLOFACIAL WITH CONTRAST TECHNIQUE: Multidetector CT imaging of  the maxillofacial structures was performed with intravenous contrast. Multiplanar CT image reconstructions were also generated. CONTRAST:  OMNIPAQUE IOHEXOL 300 MG/ML  SOLN COMPARISON:  None. FINDINGS: Osseous: No acute fracture or destructive osseous process. Orbits: The globes appear intact. No postseptal inflammation. Sinuses: Minimal left maxillary sinus mucosal thickening. No sinus fluid. Clear mastoid air cells. Soft tissues: There is asymmetric soft tissue thickening involving the left lateral aspect of the nose extending inferiorly to the ala. There is some mild hypoattenuation within the area of greatest swelling, however a drainable fluid collection is not evident. There is mild swelling extending more laterally into the soft tissues anterior to the maxillary sinus just  inferior to the orbit. Limited intracranial: Unremarkable. IMPRESSION: Left-sided nasal and facial swelling consistent with cellulitis. No abscess. Electronically Signed   By: Sebastian Ache M.D.   On: 12/04/2018 21:05    Microbiology: Recent Results (from the past 240 hour(s))  Aerobic Culture (superficial specimen)     Status: None (Preliminary result)   Collection Time: 12/06/18  2:22 PM  Result Value Ref Range Status   Specimen Description ABSCESS NOSE  Final   Special Requests NONE  Final   Gram Stain   Final    RARE WBC PRESENT, PREDOMINANTLY PMN RARE GRAM POSITIVE COCCI RARE GRAM POSITIVE RODS Performed at [facility], [address], [place], [place] [address]    Culture FEW STAPHYLOCOCCUS AUREUS  Final   Report Status PENDING  Incomplete     Labs: Basic Metabolic Panel: Recent Labs  Lab 12/04/18 1635 12/04/18 2212 12/05/18 0326 12/06/18 0302 12/08/18 0446  NA 138  --  139 139 138  K 3.8  --  3.6 4.2 4.1  CL 104  --  104 107 103  CO2 23  --  22 26 28   GLUCOSE 98  --  127* 88 88  BUN 26*  --  25* 17 18  CREATININE 0.91 0.80 0.97 0.71 0.86  CALCIUM 9.1  --  8.8* 8.4* 8.7*   Liver Function Tests: Recent Labs  Lab 12/06/18 0302 12/08/18 0446  AST 31 33  ALT 26 33  ALKPHOS 69 70  BILITOT 0.4 0.7  PROT 6.4* 6.5  ALBUMIN 3.6 3.7   No results for input(s): LIPASE, AMYLASE in the last 168 hours. No results for input(s): AMMONIA in the last 168 hours. CBC: Recent Labs  Lab 12/04/18 1635 12/04/18 2212 12/05/18 0326 12/06/18 0302 12/08/18 0446  WBC 14.4* 15.3* 11.9* 7.5 5.0  NEUTROABS 12.4*  --   --  5.1 3.0  HGB 16.9 16.0 15.8 14.1 15.3  HCT 48.3 46.6 45.5 42.0 46.4  MCV 89.6 90.7 90.8 91.5 90.6  PLT 149* 155 164 130* 158   Cardiac Enzymes: No results for input(s): CKTOTAL, CKMB, CKMBINDEX, TROPONINI in the last 168 hours. BNP: BNP (last 3 results) Recent Labs    02/24/18 1057  BNP 4    ProBNP (last 3 results) No results for input(s):  PROBNP in the last 8760 hours.  CBG: No results for input(s): GLUCAP in the last 168 hours.     Signed:  Edsel Petrin  [facility] 12/08/2018, 10:10 AM

## 2018-12-08 NOTE — Progress Notes (Signed)
Pt alert and oriented. D/C instructions given and pt d/cd home. 

## 2018-12-09 ENCOUNTER — Encounter: Payer: Self-pay | Admitting: Physician Assistant

## 2018-12-09 ENCOUNTER — Ambulatory Visit (INDEPENDENT_AMBULATORY_CARE_PROVIDER_SITE_OTHER): Payer: BLUE CROSS/BLUE SHIELD | Admitting: Physician Assistant

## 2018-12-09 VITALS — BP 107/71 | HR 62 | Temp 98.5°F | Wt 184.0 lb

## 2018-12-09 DIAGNOSIS — A4902 Methicillin resistant Staphylococcus aureus infection, unspecified site: Secondary | ICD-10-CM

## 2018-12-09 DIAGNOSIS — L03211 Cellulitis of face: Secondary | ICD-10-CM

## 2018-12-09 DIAGNOSIS — J340 Abscess, furuncle and carbuncle of nose: Secondary | ICD-10-CM

## 2018-12-09 LAB — AEROBIC CULTURE W GRAM STAIN (SUPERFICIAL SPECIMEN)

## 2018-12-09 LAB — AEROBIC CULTURE? (SUPERFICIAL SPECIMEN)

## 2018-12-09 NOTE — Patient Instructions (Signed)
MRSA Infection, Adult  Methicillin-resistant Staphylococcus aureus (MRSA) infection is caused by a bacteria (staphylococcus aureus or staph) that no longer responds to common antibiotic medicines (drug-resistant bacteria). MRSA infection can be hard to treat.  Most times, MRSA can be on the skin or in the nose without causing problems. However, if MRSA enters the body through a cut or sore, it can cause a serious infection, such as:   Skin infections.   Bone or joint infections.   Pneumonia.   Bloodstream infections (sepsis).  There are two types of MRSA:   Health care-associated MRSA. This is an infection that you get during a stay in a hospital, rehabilitation facility, nursing home, or other health care facility.   Community-associated MRSA. This is an infection that occurs after exposure to the bacteria in daily activities, such as sports, child care centers, or sharing personal items with someone who is infected with MRSA.  What are the causes?  This condition is caused by a bacteria. Illness may develop after exposure to the bacteria through:   Skin-to-skin contact with someone who is infected with MRSA.   Touching surfaces that have the bacteria.   Having a procedure or equipment that allows MRSA to enter the body.   Having MRSA living on your skin, and getting a cut or scratch that allows bacteria to enter your body.  What increases the risk?  Health care-associated MRSA  You are more likely to develop this condition if you:   Have surgery or a procedure.   Have an IV or a thin tube (catheter) placed in your body.   Are elderly.   Are on kidney dialysis.   Have recently taken an antibiotic.   Live in a long-term care facility.  Community-associated MRSA  You are more likely to develop this condition if you:   Have an infected wound or skin ulcer.   Have a weak body defense system (immune system).   Have recently taken an antibiotic.   Play sports that involve skin-to-skin contact.   Live  in a crowded setting, like a dormitory or military barracks.   Share towels, razors, or sports equipment with other people.  What are the signs or symptoms?  Symptoms of this condition depend on the area that is affected. Symptoms may include:   A pus-filled pimple or boil.   Pus draining from your skin.   A sore (abscess) under your skin or somewhere in your body.   Fever with or without chills.  How is this diagnosed?  This condition may be diagnosed based on:   A physical exam and medical history.   Taking a sample from the infected area and growing it in a lab (culture).  You may also have other tests, including:   Imaging tests such as X-rays, CT scan, or MRI.   Lab tests such as blood, urine, or sputum tests.  You may be screened for MRSA on your skin or in your nose when you are admitted to a health care facility for a procedure.  How is this treated?  Treatment depends on the type of MRSA and how severe, deep, or extensive the infection is. Treatment may include:   Antibiotic medicines.   Surgery to drain pus from the infected area.  Severe infections may require a hospital stay.  Follow these instructions at home:  Medicines   Take over-the-counter and prescription medicines only as told by your health care provider.   Take antibiotic medicine as told by your   If you have a wound, wash your hands before and after changing your bandage (dressing). Follow your health care provider's instructions for wound care.  Clean wounds, cuts, and abrasions with soap and water and cover them with dry, germ-free (sterile) dressings until they heal.  Check your wound every day for signs of  infection. Check for: ? More redness, swelling, or pain. ? More fluid or blood. ? Warmth. ? Pus or a bad smell.  Ask your health care provider if you should have a test (wound culture) for MRSA and other bacteria. General instructions  Clean surfaces regularly to remove germs (disinfection). Use products or solutions that contain bleach. Make sure you disinfect bathroom surfaces, food preparation areas, and doorknobs.  Wash towels, bedding, and clothes in the washing machine with detergent and hot water. Dry them in a hot dryer.  Always shower after playing sports or exercising.  Avoid close contact with those around you as much as possible. Do not use towels, razors, toothbrushes, bedding, or other items that will be used by others.  If you are breastfeeding, talk to your health care provider about MRSA. You may be asked to temporarily stop breastfeeding.  Tell all your health care providers that you have MRSA.  Keep all follow-up visits as told by your health care provider. This is important. Contact a health care provider if you:  Do not get better.  Have symptoms that get worse.  Have new symptoms. Get help right away if you have:  Nausea, or you vomit, or you cannot take medicine without vomiting.  Trouble breathing.  Chest pain. Summary  MRSA is an infection caused by a type of staphylococcus (staph) bacteria that does not respond to common antibiotic medicines.  Treatment for this condition depends on the type of MRSA infection and how severe, deep, and extensive the infection is.  Make sure you know the signs of a MRSA infection and when to contact a health care provider. This information is not intended to replace advice given to you by your health care provider. Make sure you discuss any questions you have with your health care provider. Document Released: 08/11/2005 Document Revised: 09/23/2017 Document Reviewed: 09/23/2017 Elsevier Interactive Patient  Education  2019 [employer]   Preventing MDRO Infections Multidrug-resistant organisms (MDRO) are bacteria that have become resistant to antibiotic medicines. This means that antibiotics cannot stop the bacteria from growing. Types of MDRO include:  Methicillin/oxacillin-resistant Staphylococcus aureus (MRSA).  Vancomycin-resistant enterococci (VRE).  Extended-spectrum beta-lactamases (ESBLs).  Clostridioides difficile (C. difficile).  Multi-drug resistant tuberculosis (MDR TB).  Penicillin-resistant Streptococcus pneumonia (PRSP).  Carbapenem-resistant enterobacteriaceae (CRE). Everyone has good and bad bacteria in his or her body, such as in the stomach or on the skin. Good bacteria help protect the body from infection. However, when you take an antibiotic medicine, it may kill both the good and bad bacteria, which then allows medicine-resistant bacteria to grow. Infections caused by MDRO can be difficult to treat. It is important to follow certain safety measures to prevent the spread of MDRO. What increases the risk? You are more likely to develop a MDRO infection if:  You were treated with an antibiotic medicine for a long time.  You have been in the hospital for a long time.  You recently had major surgery, such as chest or abdominal surgery.  You have a weakened disease-fighting (immune) system. This may be caused by an illness, long-term (chronic) condition, or medical treatment.  You have a catheter that has stayed  in for a long time, such as a urinary catheter or vascular access device. MDRO are usually spread through hands that have the germs (contaminated hands). MDRO may also be spread through:  Medical equipment that was not cleaned properly.  Shared personal items, such as razors or towels.  Contaminated surfaces, such as a bathroom counter or sink.  Undercooked or raw meat. Animals treated with antibiotics may have medicine-resistant bacteria.  Water or  vegetables contaminated with animal feces. How is this treated? MDRO infections are usually treated with antibiotic medicines that are taken by mouth (oral antibiotics). Treatment depends on the type of MDRO you have. MDRO infections are difficult to treat, and you may need to be hospitalized. Depending on how severe your infection is, you may need other treatments such as:  IV antibiotics.  High-dose antibiotics.  More than one antibiotic.  Antibiotics that you breathe in (inhaled antibiotics), if you have pneumonia.  A machine to help you breathe (ventilator). What actions can be taken? What hospitals are doing:  Encouraging staff, patients, and visitors to wash hands often with soap and warm water, and to use hand sanitizer when soap and water are not available.  Taking extra steps to prevent infection (contact precautions) with patients who are infected with MDRO. Contact precautions include: ? Having all healthcare workers and visitors wash their hands before and after leaving the room. ? Having all healthcare workers and visitors wear a gown and gloves while in the room, and asking them throw away the gown and gloves before leaving the room.  Prescribing antibiotic medicines only when they are needed. Over-prescribing antibiotic medicines can help the spread of MDRO.  Keeping patients with MDRO in a room by themselves (isolation) or placing them in rooms with other patients who are already infected with MDRO.  Carefully cleaning and disinfecting hospital rooms and equipment.  Improving communication about which patients are infected with MDRO or who have been infected in the past.  Closely monitoring and tracking MDRO infections.  Educating staff and patients about the signs of infection. What you can do:   Wash your hands regularly with soap and warm water. If soap and water are not available, use hand sanitizer.  Take antibiotic medicines only when needed. Do not take  antibiotic medicines for viral infections such as the common cold.  If you were prescribed an antibiotic medicine, take it only as told by your health care provider. Do not stop taking the antibiotic even if you start to feel better.  Do not share antibiotic medicines with others.  Do not share personal items, such as bath towels or razors.  If you have a catheter, care for it as told by your health care provider.  Keep all wounds clean and dry. Follow your health care provider's instructions about how to care for any wounds you have.  Practice safe food handling. This includes: ? Washing all fruits and vegetables. ? Washing all utensils that have come in contact with raw meat. ? Keeping a separate cutting board for raw meat. ? Cooking meat thoroughly. All poultry (including chicken and [place]) should be cooked to at least 165F (74C). Ground beef, pork, or lamb should be cooked to at least 160F (71C), and whole beef, pork, or lamb should be cooked to at least 145F (63C). ? Washing your hands with soap and warm water before and after cooking, especially after handling raw meat.  Clean and disinfect surfaces that are touched often. Use solutions or products that  contain bleach. Do this on a regular basis. What visitors can do: Although it is rare, visitors can be infected with MDRO. To prevent this, visitors should:  Wash their hands with soap and warm water before and after visiting you. If soap and water are not available, they can use hand sanitizer.  Ask your health care provider if they need to wear gloves and gowns when they visit you. If they do need to wear these, make sure they throw away the gloves and gowns before they leave your room. Where to find more information You can find more information about preventing MDRO infections from:  Centers for Disease Control and Prevention: http://www.weber-walters.com/ Summary  MDRO are bacteria that have become  resistant to antibiotic medicines.  You are more likely to be infected with a MDRO if you have been taking an antibiotic medicine for a long time or have been hospitalized for a long time.  If you were prescribed an antibiotic medicine, take it exactly as told by your health care provider.  Wash your hands regularly with soap and warm water. If soap and water are not available, use hand sanitizer.  Ask your health care provider whether your visitors need to wear gloves and gowns when visiting you. This information is not intended to replace advice given to you by your health care provider. Make sure you discuss any questions you have with your health care provider. Document Released: 12/26/2016 Document Revised: 02/09/2018 Document Reviewed: 12/26/2016 Elsevier Interactive Patient Education  2019 [employer].

## 2018-12-09 NOTE — Progress Notes (Signed)
HPI:                                                                Earl [name] is a 53 y.o. male who [name]: Primary Care Sports Medicine today for  h throughout ospital follow-up  [name] was hospitalized for MRSA cellulitis of his left nasal wall beginning 12/04/2018 until 12/08/2018.  He had failed outpatient therapy with Bactrim. He underwent incision and drainage with ENT in the hospital on 12/06/2018. He was treated with Unasyn and discharged on doxycycline. Wound culture grew out MRSA resistant to most oral antibiotics. Antibiotic was switched to Linezolid yesterday and he is taking this without issues.  Denies increased pain, swelling, redness or fever  Here for packing removal today  Past Medical History:  Diagnosis Date  . Acute urinary retention   . Arthritis   . Depression   . Hemorrhoids   . History of skin cancer in adulthood 2012   chest wall  . Hypertension   . Hypogonadism, male   . Insomnia    Past Surgical History:  Procedure Laterality Date  . APPENDECTOMY    . COLONOSCOPY    . HERNIA REPAIR    . JOINT REPLACEMENT    . OSTEOTOMY    . SHOULDER ARTHROSCOPY Left 06/2018  . TOTAL HIP ARTHROPLASTY Right   . TOTAL KNEE ARTHROPLASTY Left    Social History   Tobacco Use  . Smoking status: Never Smoker  . Smokeless tobacco: Never Used  Substance Use Topics  . Alcohol use: Not Currently   family history includes Aneurysm in his brother; Heart disease in his father and sister; Hypertension in his father and sister; Skin cancer in his father; Stroke in his mother.    ROS: negative except as noted in the HPI  Medications: Current Outpatient Medications  Medication Sig Dispense Refill  . cyclobenzaprine (FLEXERIL) 5 MG tablet Take 1 tablet (5 mg total) by mouth at bedtime as needed for muscle spasms. 30 tablet 1  . Eszopiclone (ESZOPICLONE) 3 MG TABS Take 1 tablet (3 mg total) by mouth at bedtime as needed. (Patient  taking differently: Take 3 mg by mouth at bedtime. ) 90 tablet 0  . HYDROcodone-acetaminophen (NORCO/VICODIN) 5-325 MG tablet Take 1 tablet by mouth every 8 (eight) hours as needed for moderate pain. 15 tablet 0  . ibuprofen (ADVIL,MOTRIN) 800 MG tablet Take 1 tablet (800 mg total) by mouth every 8 (eight) hours as needed. (Patient taking differently: Take 800 mg by mouth every 8 (eight) hours as needed for moderate pain. ) 90 tablet 2  . Multiple Vitamins-Minerals (MULTIVITAL PO) Take 1 tablet by mouth daily.     . sildenafil (REVATIO) 20 MG tablet 1-5 tab PO 15 minutes prior to sexual activity as needed (Patient taking differently: Take 20 mg by mouth daily as needed (ed). 1-5 tab PO 15 minutes prior to sexual activity) 50 tablet 5  . SUMAtriptan (IMITREX) 50 MG tablet Take 1 tablet (50 mg total) by mouth once as needed for up to 1 dose for migraine. May repeat in 2 hours if headache persists or recurs. 9 tablet 1  . testosterone cypionate (DEPOTESTOSTERONE CYPIONATE) 200 MG/ML injection Inject 200 mg into the muscle every 14 (fourteen) days.     .Marland Kitchen  linezolid (ZYVOX) 600 MG tablet Take 600 mg by mouth 2 (two) times daily.     No current facility-administered medications for this visit.    Allergies  Allergen Reactions  . Iodinated Diagnostic Agents Swelling    N/V - rash- red skin   . Morphine And Related Other (See Comments)    Irritability   . Temazepam Other (See Comments)    Mood change, irritability       Objective:  BP 107/71   Pulse 62   Temp 98.5 F (36.9 C) (Oral)   Wt 184 lb (83.5 kg)   SpO2 96%   BMI 27.98 kg/m  Gen:  alert, not ill-appearing, no distress, appropriate for age HEENT: head normocephalic without obvious abnormality, conjunctiva and cornea clear, trachea midline Nose: Nasal packing removed, left lateral nasal wall there is a small incision at the site of the abscess that is draining a scant amount of purulent fluid, no bleeding or foreign body Pulm:  Normal work of breathing, normal phonation Neuro: alert and oriented x 3 MSK: extremities atraumatic, normal gait and station Skin:  Lab Results  Component Value Date   WBC 5.0 12/08/2018   HGB 15.3 12/08/2018   HCT 46.4 12/08/2018   MCV 90.6 12/08/2018   PLT 158 12/08/2018   Aerobic Culture (superficial specimen)  Order: [id number]  Status:  Final result  Visible to patient:  Yes (MyChart)  Next appt:  None  Specimen Information: Abscess     Component 8d ago  Specimen Description ABSCESS NOSE   Special Requests NONE   Gram Stain RARE WBC PRESENT, PREDOMINANTLY PMN  RARE GRAM POSITIVE COCCI  RARE GRAM POSITIVE RODS  Performed at [facility], [address] [place], [place] [address]   Culture FEW METHICILLIN RESISTANT STAPHYLOCOCCUS AUREUS   Report Status 12/09/2018 FINAL   Organism ID, Bacteria METHICILLIN RESISTANT STAPHYLOCOCCUS AUREUS   Resulting Agency [facility]  Susceptibility    Methicillin resistant staphylococcus aureus    MIC    CIPROFLOXACIN >=8 RESISTANT  Resistant    CLINDAMYCIN >=8 RESISTANT  Resistant    ERYTHROMYCIN >=8 RESISTANT  Resistant    GENTAMICIN <=0.5 SENSI... Sensitive    Inducible Clindamycin NEGATIVE  Sensitive    OXACILLIN >=4 RESISTANT  Resistant    RIFAMPIN <=0.5 SENSI... Sensitive    TETRACYCLINE >=16 RESIST... Resistant    TRIMETH/SULFA >=320 RESIS... Resistant    VANCOMYCIN 1 SENSITIVE  Sensitive         Susceptibility Comments   Methicillin resistant staphylococcus aureus  FEW METHICILLIN RESISTANT STAPHYLOCOCCUS AUREUS      Specimen Collected: 12/06/18 14:22  Last Resulted: 12/09/18 12:39          Assessment and Plan: 53 y.o. male with   .Saron was seen today for follow-up.  Diagnoses and all orders for this visit:  MRSA (methicillin resistant Staphylococcus aureus) infection  Facial cellulitis  Abscess of nasal cavity    Afebrile, no tachypnea, no tachycardia Personally reviewed lab results  including wound culture and cbc Resolution of leukocytosis and thrombocytopenia  Packing removed from left nare today without difficulty Nasal cavity was inspected with nasal speculum     Continue linezolid for full 1 week course Encouraged patient to contact me if still symptomatic after completion of antibiotic, may need an additional week of therapy Counseled not to use triptan while taking this antibiotic due to risk of serotonin syndrome  Patient education and anticipatory guidance given Patient agrees with treatment plan Follow-up as  needed if symptoms worsen or fail to improve  Darlyne Russian PA-C

## 2018-12-10 ENCOUNTER — Ambulatory Visit: Payer: BLUE CROSS/BLUE SHIELD | Admitting: Sports Medicine

## 2018-12-13 ENCOUNTER — Encounter: Payer: Self-pay | Admitting: Physician Assistant

## 2018-12-14 ENCOUNTER — Encounter: Payer: Self-pay | Admitting: Physician Assistant

## 2018-12-14 DIAGNOSIS — J340 Abscess, furuncle and carbuncle of nose: Secondary | ICD-10-CM | POA: Insufficient documentation

## 2018-12-14 HISTORY — DX: Abscess, furuncle and carbuncle of nose: J34.0

## 2018-12-27 ENCOUNTER — Encounter (INDEPENDENT_AMBULATORY_CARE_PROVIDER_SITE_OTHER): Payer: BLUE CROSS/BLUE SHIELD | Admitting: Physician Assistant

## 2018-12-27 DIAGNOSIS — Z79899 Other long term (current) drug therapy: Secondary | ICD-10-CM

## 2018-12-27 DIAGNOSIS — A4902 Methicillin resistant Staphylococcus aureus infection, unspecified site: Secondary | ICD-10-CM | POA: Diagnosis not present

## 2018-12-27 DIAGNOSIS — L03211 Cellulitis of face: Secondary | ICD-10-CM

## 2018-12-28 DIAGNOSIS — A4902 Methicillin resistant Staphylococcus aureus infection, unspecified site: Secondary | ICD-10-CM | POA: Diagnosis not present

## 2018-12-28 DIAGNOSIS — Z79899 Other long term (current) drug therapy: Secondary | ICD-10-CM | POA: Diagnosis not present

## 2018-12-28 DIAGNOSIS — L03211 Cellulitis of face: Secondary | ICD-10-CM | POA: Diagnosis not present

## 2018-12-28 LAB — CBC WITH DIFFERENTIAL/PLATELET
Absolute Monocytes: 808 cells/uL (ref 200–950)
Basophils Absolute: 34 cells/uL (ref 0–200)
Basophils Relative: 0.4 %
Eosinophils Absolute: 102 cells/uL (ref 15–500)
Eosinophils Relative: 1.2 %
HCT: 49.4 % (ref 38.5–50.0)
Hemoglobin: 17.3 g/dL — ABNORMAL HIGH (ref 13.2–17.1)
Lymphs Abs: 1836 cells/uL (ref 850–3900)
MCH: 31.1 pg (ref 27.0–33.0)
MCHC: 35 g/dL (ref 32.0–36.0)
MCV: 88.7 fL (ref 80.0–100.0)
MPV: 11.6 fL (ref 7.5–12.5)
Monocytes Relative: 9.5 %
Neutro Abs: 5721 cells/uL (ref 1500–7800)
Neutrophils Relative %: 67.3 %
Platelets: 168 10*3/uL (ref 140–400)
RBC: 5.57 10*6/uL (ref 4.20–5.80)
RDW: 13 % (ref 11.0–15.0)
Total Lymphocyte: 21.6 %
WBC: 8.5 10*3/uL (ref 3.8–10.8)

## 2018-12-28 MED ORDER — LINEZOLID 600 MG PO TABS: 600.0000 mg | ORAL_TABLET | Freq: Two times a day (BID) | ORAL | 0 refills | Status: DC

## 2018-12-28 NOTE — Telephone Encounter (Signed)
Subjective:  Patient with recent history of MRSA cellulitis/abscess of left nasal wall requiring hospitalization and I&D last month reported over MyChart that he is experiencing similar symptoms in the opposite nostril. Endorses pain, swelling and redness at the base of his right nostril for several days. States symptoms were preceded by allergic rhinitis symptoms and frequent nose blowing. Has been taking Ibuprofen 800 mg with mild relief.  Assessment & Plan:  .Diagnoses and all orders for this visit:  MRSA (methicillin resistant Staphylococcus aureus) infection -     linezolid (ZYVOX) 600 MG tablet; Take 1 tablet (600 mg total) by mouth 2 (two) times daily for 14 days. -     CBC with Differential/Platelet  Facial cellulitis -     linezolid (ZYVOX) 600 MG tablet; Take 1 tablet (600 mg total) by mouth 2 (two) times daily for 14 days. -     CBC with Differential/Platelet  Other long term (current) drug therapy -     CBC with Differential/Platelet   CBC today Re-start Linezolid bid x 2 weeks Follow-up in 1 week in office or sooner if symptoms worsen/fail to improve

## 2019-01-06 DIAGNOSIS — Z03818 Encounter for observation for suspected exposure to other biological agents ruled out: Secondary | ICD-10-CM | POA: Diagnosis not present

## 2019-01-10 ENCOUNTER — Encounter: Payer: Self-pay | Admitting: Sports Medicine

## 2019-01-10 ENCOUNTER — Other Ambulatory Visit: Payer: Self-pay

## 2019-01-10 ENCOUNTER — Ambulatory Visit (INDEPENDENT_AMBULATORY_CARE_PROVIDER_SITE_OTHER): Payer: BLUE CROSS/BLUE SHIELD

## 2019-01-10 DIAGNOSIS — Z9889 Other specified postprocedural states: Secondary | ICD-10-CM | POA: Diagnosis not present

## 2019-01-10 DIAGNOSIS — M25812 Other specified joint disorders, left shoulder: Secondary | ICD-10-CM | POA: Diagnosis not present

## 2019-01-10 DIAGNOSIS — M7522 Bicipital tendinitis, left shoulder: Secondary | ICD-10-CM

## 2019-01-12 DIAGNOSIS — M25512 Pain in left shoulder: Secondary | ICD-10-CM | POA: Diagnosis not present

## 2019-01-21 ENCOUNTER — Other Ambulatory Visit: Payer: Self-pay | Admitting: Sports Medicine

## 2019-01-21 DIAGNOSIS — S46012A Strain of muscle(s) and tendon(s) of the rotator cuff of left shoulder, initial encounter: Secondary | ICD-10-CM

## 2019-01-26 ENCOUNTER — Ambulatory Visit: Payer: BC Managed Care – PPO | Admitting: Sports Medicine

## 2019-01-26 DIAGNOSIS — M25512 Pain in left shoulder: Secondary | ICD-10-CM | POA: Diagnosis not present

## 2019-01-26 DIAGNOSIS — N529 Male erectile dysfunction, unspecified: Secondary | ICD-10-CM | POA: Diagnosis not present

## 2019-01-26 DIAGNOSIS — F5104 Psychophysiologic insomnia: Secondary | ICD-10-CM | POA: Diagnosis not present

## 2019-01-26 DIAGNOSIS — Z9889 Other specified postprocedural states: Secondary | ICD-10-CM

## 2019-01-26 MED ORDER — ESZOPICLONE 3 MG PO TABS: 3.0000 mg | ORAL_TABLET | Freq: Every day | ORAL | 0 refills | Status: DC

## 2019-01-26 MED ORDER — SILDENAFIL CITRATE 20 MG PO TABS: 20.0000 mg | ORAL_TABLET | Freq: Every day | ORAL | 3 refills | Status: DC | PRN

## 2019-01-26 NOTE — Progress Notes (Addendum)
Subjective:    CC: Left shoulder pain  HPI: This is a pleasant 53 year old male, he has a history of a rotator cuff repair, he is been having severe pain in his shoulder, localized over the deltoid, worse with abduction and overhead activities.  MRI did not show an overt rotator cuff tear, but significant tendinosis.  He does have a subacromial procedure scheduled in a couple of weeks, the plan is possible rotator cuff repair versus plication of the supraspinatus, as well as biceps tenodesis.  He is in significant pain today and is wondering if we can simply do a lidocaine/bupivacaine injection.  Insomnia: Needs a refill on eszopiclone.  Erectile dysfunction: Needs a refill on generic Viagra.  I reviewed the past medical history, family history, social history, surgical history, and allergies today and no changes were needed.  Please see the problem list section below in epic for further details.  Past Medical History: Past Medical History:  Diagnosis Date  . Acute urinary retention   . Arthritis   . Depression   . Hemorrhoids   . History of skin cancer in adulthood 2012   chest wall  . Hypertension   . Hypogonadism, male   . Insomnia    Past Surgical History: Past Surgical History:  Procedure Laterality Date  . APPENDECTOMY    . COLONOSCOPY    . HERNIA REPAIR    . JOINT REPLACEMENT    . OSTEOTOMY    . SHOULDER ARTHROSCOPY Left 06/2018  . TOTAL HIP ARTHROPLASTY Right   . TOTAL KNEE ARTHROPLASTY Left    Social History: Social History   Socioeconomic History  . Marital status: Divorced    Spouse name: Not on file  . Number of children: Not on file  . Years of education: Not on file  . Highest education level: Not on file  Occupational History  . Not on file  Social Needs  . Financial resource strain: Not on file  . Food insecurity:    Worry: Not on file    Inability: Not on file  . Transportation needs:    Medical: Not on file    Non-medical: Not on file   Tobacco Use  . Smoking status: Never Smoker  . Smokeless tobacco: Never Used  Substance and Sexual Activity  . Alcohol use: Not Currently  . Drug use: Never  . Sexual activity: Yes    Birth control/protection: None  Lifestyle  . Physical activity:    Days per week: Not on file    Minutes per session: Not on file  . Stress: Not on file  Relationships  . Social connections:    Talks on phone: Not on file    Gets together: Not on file    Attends religious service: Not on file    Active member of club or organization: Not on file    Attends meetings of clubs or organizations: Not on file    Relationship status: Not on file  Other Topics Concern  . Not on file  Social History Narrative  . Not on file   Family History: Family History  Problem Relation Age of Onset  . Stroke Mother   . Skin cancer Father   . Heart disease Father   . Hypertension Father   . Aneurysm Brother   . Hypertension Sister   . Heart disease Sister   . Cancer Neg Hx   . Heart attack Neg Hx    Allergies: Allergies  Allergen Reactions  . Iodinated Diagnostic Agents  Swelling    N/V - rash- red skin   . Morphine And Related Other (See Comments)    Irritability   . Temazepam Other (See Comments)    Mood change, irritability   Medications: See med rec.  Review of Systems: No fevers, chills, night sweats, weight loss, chest pain, or shortness of breath.   Objective:    General: Well Developed, well nourished, and in no acute distress.  Neuro: Alert and oriented x3, extra-ocular muscles intact, sensation grossly intact.  HEENT: Normocephalic, atraumatic, pupils equal round reactive to light, neck supple, no masses, no lymphadenopathy, thyroid nonpalpable.  Skin: Warm and dry, no rashes. Cardiac: Regular rate and rhythm, no murmurs rubs or gallops, no lower extremity edema.  Respiratory: Clear to auscultation bilaterally. Not using accessory muscles, speaking in full sentences. Left shoulder:  Inspection reveals no abnormalities, atrophy or asymmetry. Palpation is normal with no tenderness over AC joint or bicipital groove. ROM is full in all planes. Rotator cuff strength normal weak to abduction, reproduction of severe pain with resisted external rotation. No signs of impingement with negative Neer and Hawkin's tests, empty can. Speeds and Yergason's tests normal. No labral pathology noted with negative Obrien's, negative crank, negative clunk, and good stability. Normal scapular function observed. No painful arc and no drop arm sign. No apprehension sign  Procedure: Real-time Ultrasound Guided injection of the left subacromial bursa Device: GE Logiq E  Verbal informed consent obtained.  Time-out conducted.  Noted no overlying erythema, induration, or other signs of local infection.  Skin prepped in a sterile fashion.  Local anesthesia: Topical Ethyl chloride.  With sterile technique and under real time ultrasound guidance:  3 cc lidocaine, 3 cc bupivacaine injected easily Completed without difficulty  Pain immediately resolved suggesting accurate placement of the medication.  Advised to call if fevers/chills, erythema, induration, drainage, or persistent bleeding.  Images permanently stored and available for review in the ultrasound unit.  Impression: Technically successful ultrasound guided injection.  Impression and Recommendations:    Status post left rotator cuff repair Scheduled for subacromial decompression, he will probably get a biceps tenodesis, and possible supraspinatus graft. He is asking for an injection today, we are simply going to use lidocaine and bupivacaine. Return as needed.  Chronic insomnia Stable, well-controlled, refilling eszopiclone.  Erectile dysfunction Stable and well-controlled, adding generic Viagra.   ___________________________________________ Earl [name]. Benjamin Stain, M.D., ABFM., CAQSM. Primary Care and Sports Medicine [facility]  Adjunct Professor of Family Medicine  [employer]

## 2019-01-26 NOTE — Assessment & Plan Note (Signed)
Scheduled for subacromial decompression, he will probably get a biceps tenodesis, and possible supraspinatus graft. He is asking for an injection today, we are simply going to use lidocaine and bupivacaine. Return as needed.

## 2019-02-02 NOTE — Assessment & Plan Note (Signed)
Stable, well-controlled, refilling eszopiclone.

## 2019-02-02 NOTE — Assessment & Plan Note (Signed)
Stable and well-controlled, adding generic Viagra.

## 2019-02-07 DIAGNOSIS — Z1159 Encounter for screening for other viral diseases: Secondary | ICD-10-CM | POA: Diagnosis not present

## 2019-02-07 DIAGNOSIS — M25512 Pain in left shoulder: Secondary | ICD-10-CM | POA: Diagnosis not present

## 2019-02-07 DIAGNOSIS — Z01812 Encounter for preprocedural laboratory examination: Secondary | ICD-10-CM | POA: Diagnosis not present

## 2019-02-08 ENCOUNTER — Encounter: Payer: Self-pay | Admitting: Sports Medicine

## 2019-02-10 ENCOUNTER — Encounter: Payer: Self-pay | Admitting: Sports Medicine

## 2019-02-10 DIAGNOSIS — S46012A Strain of muscle(s) and tendon(s) of the rotator cuff of left shoulder, initial encounter: Secondary | ICD-10-CM | POA: Diagnosis not present

## 2019-02-10 DIAGNOSIS — M7522 Bicipital tendinitis, left shoulder: Secondary | ICD-10-CM | POA: Diagnosis not present

## 2019-02-10 DIAGNOSIS — Z8249 Family history of ischemic heart disease and other diseases of the circulatory system: Secondary | ICD-10-CM | POA: Diagnosis not present

## 2019-02-10 DIAGNOSIS — M75112 Incomplete rotator cuff tear or rupture of left shoulder, not specified as traumatic: Secondary | ICD-10-CM | POA: Diagnosis not present

## 2019-02-10 DIAGNOSIS — Z91041 Radiographic dye allergy status: Secondary | ICD-10-CM | POA: Diagnosis not present

## 2019-02-10 DIAGNOSIS — Z79899 Other long term (current) drug therapy: Secondary | ICD-10-CM | POA: Diagnosis not present

## 2019-02-10 DIAGNOSIS — Z885 Allergy status to narcotic agent status: Secondary | ICD-10-CM | POA: Diagnosis not present

## 2019-02-10 DIAGNOSIS — G8918 Other acute postprocedural pain: Secondary | ICD-10-CM | POA: Diagnosis not present

## 2019-02-10 DIAGNOSIS — M24112 Other articular cartilage disorders, left shoulder: Secondary | ICD-10-CM | POA: Diagnosis not present

## 2019-02-10 DIAGNOSIS — F329 Major depressive disorder, single episode, unspecified: Secondary | ICD-10-CM | POA: Diagnosis not present

## 2019-02-10 DIAGNOSIS — M75122 Complete rotator cuff tear or rupture of left shoulder, not specified as traumatic: Secondary | ICD-10-CM | POA: Diagnosis not present

## 2019-02-10 DIAGNOSIS — Z859 Personal history of malignant neoplasm, unspecified: Secondary | ICD-10-CM | POA: Diagnosis not present

## 2019-02-10 DIAGNOSIS — Z809 Family history of malignant neoplasm, unspecified: Secondary | ICD-10-CM | POA: Diagnosis not present

## 2019-02-10 DIAGNOSIS — I10 Essential (primary) hypertension: Secondary | ICD-10-CM | POA: Diagnosis not present

## 2019-02-14 ENCOUNTER — Encounter: Payer: Self-pay | Admitting: Physician Assistant

## 2019-02-14 ENCOUNTER — Ambulatory Visit (INDEPENDENT_AMBULATORY_CARE_PROVIDER_SITE_OTHER): Payer: BC Managed Care – PPO | Admitting: Physician Assistant

## 2019-02-14 VITALS — BP 145/97 | HR 86 | Temp 98.3°F | Wt 189.0 lb

## 2019-02-14 DIAGNOSIS — G43019 Migraine without aura, intractable, without status migrainosus: Secondary | ICD-10-CM | POA: Diagnosis not present

## 2019-02-14 DIAGNOSIS — R030 Elevated blood-pressure reading, without diagnosis of hypertension: Secondary | ICD-10-CM | POA: Diagnosis not present

## 2019-02-14 MED ORDER — DEXAMETHASONE SODIUM PHOSPHATE 10 MG/ML IJ SOLN
10.0000 mg | Freq: Once | INTRAMUSCULAR | Status: AC
  Administered 2019-02-14: 4 mg via INTRAMUSCULAR

## 2019-02-14 MED ORDER — PROMETHAZINE HCL 12.5 MG PO TABS: 12.5000 mg | ORAL_TABLET | Freq: Four times a day (QID) | ORAL | 0 refills | Status: DC | PRN

## 2019-02-14 MED ORDER — KETOROLAC TROMETHAMINE 30 MG/ML IJ SOLN
30.0000 mg | Freq: Once | INTRAMUSCULAR | Status: AC
  Administered 2019-02-14: 30 mg via INTRAMUSCULAR

## 2019-02-14 MED ORDER — ELETRIPTAN HYDROBROMIDE 40 MG PO TABS: 40.0000 mg | ORAL_TABLET | ORAL | 1 refills | Status: DC | PRN

## 2019-02-14 NOTE — Progress Notes (Signed)
HPI:                                                                Earl [name] is a 53 y.o. male who presents to [name] today for migraine  Migraine  This is a recurrent problem. The current episode started today. The problem occurs constantly. The problem has been gradually worsening. The pain is located in the right unilateral region. The pain does not radiate. The quality of the pain is described as throbbing. The pain is at a severity of 7/10. Associated symptoms include nausea, photophobia and vomiting. Associated symptoms comments: + diaphoresis. The symptoms are aggravated by bright light and activity. He has tried triptans for the symptoms. The treatment provided no relief. His past medical history is significant for migraine headaches.   Reports he has had 4 migraines this year. Each has occurred first thing in the morning where he wakes up with a headache. Sometimes associated with alcohol consumption the night before.  He took Imitrex 50 mg this morning. Reports vomiting shortly after. Did not take a second dose. Only 1 episode of emesis today.   Recent left shoulder arthroscopy on 02/10/19. Reports he is not taking any opioids or anti-inflammatory medications.   Past Medical History:  Diagnosis Date  . Acute urinary retention   . Arthritis   . Depression   . Hemorrhoids   . History of skin cancer in adulthood 2012   chest wall  . Hypertension   . Hypogonadism, male   . Insomnia    Past Surgical History:  Procedure Laterality Date  . APPENDECTOMY    . COLONOSCOPY    . HERNIA REPAIR    . JOINT REPLACEMENT    . OSTEOTOMY    . SHOULDER ARTHROSCOPY Left 06/2018  . TOTAL HIP ARTHROPLASTY Right   . TOTAL KNEE ARTHROPLASTY Left    Social History   Tobacco Use  . Smoking status: Never Smoker  . Smokeless tobacco: Never Used  Substance Use Topics  . Alcohol use: Not Currently   family history includes Aneurysm  in his brother; Heart disease in his father and sister; Hypertension in his father and sister; Skin cancer in his father; Stroke in his mother.    ROS: negative except as noted in the HPI  Medications: Current Outpatient Medications  Medication Sig Dispense Refill  . Eszopiclone (ESZOPICLONE) 3 MG TABS Take 1 tablet (3 mg total) by mouth at bedtime. 30 tablet 0  . sildenafil (REVATIO) 20 MG tablet Take 1 tablet (20 mg total) by mouth daily as needed (ed). 1-5 tab PO 15 minutes prior to sexual activity 50 tablet 3  . testosterone cypionate (DEPOTESTOSTERONE CYPIONATE) 200 MG/ML injection Inject 200 mg into the muscle every 14 (fourteen) days.     Marland Kitchen. eletriptan (RELPAX) 40 MG tablet Take 1 tablet (40 mg total) by mouth as needed for migraine or headache. May repeat 1 add'l dose in 2 hours if migraine persist/recurs. Max dose 80 mg/24hr 9 tablet 1  . Multiple Vitamins-Minerals (MULTIVITAL PO) Take 1 tablet by mouth daily.     . promethazine (PHENERGAN) 12.5 MG tablet Take 1-2 tablets (12.5-25 mg total) by mouth every 6 (six) hours as needed for nausea or vomiting.  20 tablet 0   No current facility-administered medications for this visit.    Allergies  Allergen Reactions  . Iodinated Diagnostic Agents Swelling    N/V - rash- red skin   . Morphine And Related Other (See Comments)    Irritability   . Temazepam Other (See Comments)    Mood change, irritability       Objective:  BP (!) 145/97   Pulse 86   Temp 98.3 F (36.8 C) (Oral)   Wt 189 lb (85.7 kg)   BMI 28.74 kg/m  Vitals:   02/14/19 1620 02/14/19 1644  BP: (!) 165/78 (!) 145/97  Pulse: 89 86  Temp: 98.3 F (36.8 C)   Gen: well-groomed, not ill-appearing, no acute distress HEENT: head normocephalic, atraumatic; conjunctiva and cornea clear, oropharynx clear, moist mucus membranes; neck supple, no meningeal signs Pulm: Normal work of breathing, normal phonation, clear to auscultation bilaterally CV: Normal rate, regular  rhythm, s1 and s2 distinct, no murmurs, clicks or rubs Neuro:  cranial nerves II-XII intact, no nystagmus, normal heel-to-shin, normal rapid alternating movements, normal tone, no tremor MSK: strength 5/5 and symmetric in bilateral lower extremities and right upper extremity, LUE not assessed due to recent surgery, normal gait and station Mental Status: alert and oriented x 3, speech articulate, and thought processes clear and goal-directed  BP Readings from Last 3 Encounters:  02/14/19 (!) 145/97  01/26/19 (!) 149/78  12/09/18 107/71   Recent Results (from the past 2160 hour(s))  CBC with Differential     Status: Abnormal   Collection Time: 12/04/18  4:35 PM  Result Value Ref Range   WBC 14.4 (H) 4.0 - 10.5 K/uL   RBC 5.39 4.22 - 5.81 MIL/uL   Hemoglobin 16.9 13.0 - 17.0 g/dL   HCT 82.948.3 56.239.0 - 13.052.0 %   MCV 89.6 80.0 - 100.0 fL   MCH 31.4 26.0 - 34.0 pg   MCHC 35.0 30.0 - 36.0 g/dL   RDW 86.512.6 78.411.5 - 69.615.5 %   Platelets 149 (L) 150 - 400 K/uL   nRBC 0.0 0.0 - 0.2 %   Neutrophils Relative % 87 %   Neutro Abs 12.4 (H) 1.7 - 7.7 K/uL   Lymphocytes Relative 6 %   Lymphs Abs 0.9 0.7 - 4.0 K/uL   Monocytes Relative 7 %   Monocytes Absolute 1.1 (H) 0.1 - 1.0 K/uL   Eosinophils Relative 0 %   Eosinophils Absolute 0.0 0.0 - 0.5 K/uL   Basophils Relative 0 %   Basophils Absolute 0.0 0.0 - 0.1 K/uL   Immature Granulocytes 0 %   Abs Immature Granulocytes 0.06 0.00 - 0.07 K/uL    Comment: Performed at [facility], [address] [place], [place] [address]  Basic metabolic panel     Status: Abnormal   Collection Time: 12/04/18  4:35 PM  Result Value Ref Range   Sodium 138 135 - 145 mmol/L   Potassium 3.8 3.5 - 5.1 mmol/L   Chloride 104 98 - 111 mmol/L   CO2 23 22 - 32 mmol/L   Glucose, Bld 98 70 - 99 mg/dL   BUN 26 (H) 6 - 20 mg/dL   Creatinine, Ser 4.130.91 0.61 - 1.24 mg/dL   Calcium 9.1 8.9 - 24.410.3 mg/dL   GFR calc non Af Amer >60 >60 mL/min   GFR calc Af Amer >60  >60 mL/min   Anion gap 11 5 - 15    Comment: Performed at [facility], [address] [place], [place] [address]  HIV antibody (Routine Testing)     Status: None   Collection Time: 12/04/18 10:12 PM  Result Value Ref Range   HIV Screen 4th Generation wRfx Non Reactive Non Reactive    Comment: (NOTE) Performed At: [facility]  [address] [place], [place] [id number] Jolene SchimkeNagendra Sanjai MD NW:[phone]   CBC     Status: Abnormal   Collection Time: 12/04/18 10:12 PM  Result Value Ref Range   WBC 15.3 (H) 4.0 - 10.5 K/uL   RBC 5.14 4.22 - 5.81 MIL/uL   Hemoglobin 16.0 13.0 - 17.0 g/dL   HCT 57.846.6 46.939.0 - 62.952.0 %   MCV 90.7 80.0 - 100.0 fL   MCH 31.1 26.0 - 34.0 pg   MCHC 34.3 30.0 - 36.0 g/dL   RDW 52.812.6 41.311.5 - 24.415.5 %   Platelets 155 150 - 400 K/uL   nRBC 0.0 0.0 - 0.2 %    Comment: Performed at [facility], [address] [place], [place] [address]  Creatinine, serum     Status: None   Collection Time: 12/04/18 10:12 PM  Result Value Ref Range   Creatinine, Ser 0.80 0.61 - 1.24 mg/dL   GFR calc non Af Amer >60 >60 mL/min   GFR calc Af Amer >60 >60 mL/min    Comment: Performed at [facility], [address] [place], [place] [address]  Basic metabolic panel     Status: Abnormal   Collection Time: 12/05/18  3:26 AM  Result Value Ref Range   Sodium 139 135 - 145 mmol/L   Potassium 3.6 3.5 - 5.1 mmol/L   Chloride 104 98 - 111 mmol/L   CO2 22 22 - 32 mmol/L   Glucose, Bld 127 (H) 70 - 99 mg/dL   BUN 25 (H) 6 - 20 mg/dL   Creatinine, Ser 4.400.97 0.61 - 1.24 mg/dL   Calcium 8.8 (L) 8.9 - 10.3 mg/dL   GFR calc non Af Amer >60 >60 mL/min   GFR calc Af Amer >60 >60 mL/min   Anion gap 13 5 - 15    Comment: Performed at [facility], [address] [place], [place] [address]  CBC     Status: Abnormal   Collection Time: 12/05/18  3:26 AM  Result Value Ref Range   WBC 11.9 (H) 4.0 - 10.5 K/uL   RBC  5.01 4.22 - 5.81 MIL/uL   Hemoglobin 15.8 13.0 - 17.0 g/dL   HCT 59.545.5 63.839.0 - 75.652.0 %   MCV 90.8 80.0 - 100.0 fL   MCH 31.5 26.0 - 34.0 pg   MCHC 34.7 30.0 - 36.0 g/dL   RDW 43.312.9 29.511.5 - 18.815.5 %   Platelets 164 150 - 400 K/uL   nRBC 0.0 0.0 - 0.2 %    Comment: Performed at [facility], [address] [place], [place] [address]  Comprehensive metabolic panel     Status: Abnormal   Collection Time: 12/06/18  3:02 AM  Result Value Ref Range   Sodium 139 135 - 145 mmol/L   Potassium 4.2 3.5 - 5.1 mmol/L   Chloride 107 98 - 111 mmol/L   CO2 26 22 - 32 mmol/L   Glucose, Bld 88 70 - 99 mg/dL   BUN 17 6 - 20 mg/dL   Creatinine, Ser 6.300.71 0.61 - 1.24 mg/dL   Calcium 8.4 (L) 8.9 - 10.3 mg/dL   Total Protein 6.4 (L) 6.5 - 8.1  g/dL   Albumin 3.6 3.5 - 5.0 g/dL   AST 31 15 - 41 U/L   ALT 26 0 - 44 U/L   Alkaline Phosphatase 69 38 - 126 U/L   Total Bilirubin 0.4 0.3 - 1.2 mg/dL   GFR calc non Af Amer >60 >60 mL/min   GFR calc Af Amer >60 >60 mL/min   Anion gap 6 5 - 15    Comment: Performed at [facility], [address] [place], [place] [address]  CBC with Differential/Platelet     Status: Abnormal   Collection Time: 12/06/18  3:02 AM  Result Value Ref Range   WBC 7.5 4.0 - 10.5 K/uL   RBC 4.59 4.22 - 5.81 MIL/uL   Hemoglobin 14.1 13.0 - 17.0 g/dL   HCT 60.4 54.0 - 98.1 %   MCV 91.5 80.0 - 100.0 fL   MCH 30.7 26.0 - 34.0 pg   MCHC 33.6 30.0 - 36.0 g/dL   RDW 19.1 47.8 - 29.5 %   Platelets 130 (L) 150 - 400 K/uL   nRBC 0.0 0.0 - 0.2 %   Neutrophils Relative % 69 %   Neutro Abs 5.1 1.7 - 7.7 K/uL   Lymphocytes Relative 19 %   Lymphs Abs 1.4 0.7 - 4.0 K/uL   Monocytes Relative 10 %   Monocytes Absolute 0.8 0.1 - 1.0 K/uL   Eosinophils Relative 1 %   Eosinophils Absolute 0.1 0.0 - 0.5 K/uL   Basophils Relative 0 %   Basophils Absolute 0.0 0.0 - 0.1 K/uL   Immature Granulocytes 1 %   Abs Immature Granulocytes 0.06 0.00 - 0.07 K/uL    Comment:  Performed at [facility], [address] [place], [place] [address]  Aerobic Culture (superficial specimen)     Status: None   Collection Time: 12/06/18  2:22 PM   Specimen: Abscess  Result Value Ref Range   Specimen Description ABSCESS NOSE    Special Requests NONE    Gram Stain      RARE WBC PRESENT, PREDOMINANTLY PMN RARE GRAM POSITIVE COCCI RARE GRAM POSITIVE RODS Performed at [facility], [address], [place], [place] [address]    Culture FEW METHICILLIN RESISTANT STAPHYLOCOCCUS AUREUS    Report Status 12/09/2018 FINAL    Organism ID, Bacteria METHICILLIN RESISTANT STAPHYLOCOCCUS AUREUS       Susceptibility   Methicillin resistant staphylococcus aureus - MIC*    CIPROFLOXACIN >=8 RESISTANT Resistant     ERYTHROMYCIN >=8 RESISTANT Resistant     GENTAMICIN <=0.5 SENSITIVE Sensitive     OXACILLIN >=4 RESISTANT Resistant     TETRACYCLINE >=16 RESISTANT Resistant     VANCOMYCIN 1 SENSITIVE Sensitive     TRIMETH/SULFA >=320 RESISTANT Resistant     CLINDAMYCIN >=8 RESISTANT Resistant     RIFAMPIN <=0.5 SENSITIVE Sensitive     Inducible Clindamycin NEGATIVE Sensitive     * FEW METHICILLIN RESISTANT STAPHYLOCOCCUS AUREUS  CBC with Differential/Platelet     Status: None   Collection Time: 12/08/18  4:46 AM  Result Value Ref Range   WBC 5.0 4.0 - 10.5 K/uL   RBC 5.12 4.22 - 5.81 MIL/uL   Hemoglobin 15.3 13.0 - 17.0 g/dL   HCT 46.9 62.9 - 52.8 %   MCV 90.6 80.0 - 100.0 fL   MCH 29.9 26.0 - 34.0 pg   MCHC 33.0 30.0 - 36.0 g/dL   RDW 41.3 24.4 - 01.0 %   Platelets 158 150 -  400 K/uL   nRBC 0.0 0.0 - 0.2 %   Neutrophils Relative % 59 %   Neutro Abs 3.0 1.7 - 7.7 K/uL   Lymphocytes Relative 29 %   Lymphs Abs 1.5 0.7 - 4.0 K/uL   Monocytes Relative 9 %   Monocytes Absolute 0.5 0.1 - 1.0 K/uL   Eosinophils Relative 2 %   Eosinophils Absolute 0.1 0.0 - 0.5 K/uL   Basophils Relative 1 %   Basophils Absolute 0.0 0.0 - 0.1 K/uL   Immature  Granulocytes 0 %   Abs Immature Granulocytes 0.01 0.00 - 0.07 K/uL    Comment: Performed at [facility], [address] [place], [place] [address]  Comprehensive metabolic panel     Status: Abnormal   Collection Time: 12/08/18  4:46 AM  Result Value Ref Range   Sodium 138 135 - 145 mmol/L   Potassium 4.1 3.5 - 5.1 mmol/L   Chloride 103 98 - 111 mmol/L   CO2 28 22 - 32 mmol/L   Glucose, Bld 88 70 - 99 mg/dL   BUN 18 6 - 20 mg/dL   Creatinine, Ser 6.04 0.61 - 1.24 mg/dL   Calcium 8.7 (L) 8.9 - 10.3 mg/dL   Total Protein 6.5 6.5 - 8.1 g/dL   Albumin 3.7 3.5 - 5.0 g/dL   AST 33 15 - 41 U/L   ALT 33 0 - 44 U/L   Alkaline Phosphatase 70 38 - 126 U/L   Total Bilirubin 0.7 0.3 - 1.2 mg/dL   GFR calc non Af Amer >60 >60 mL/min   GFR calc Af Amer >60 >60 mL/min   Anion gap 7 5 - 15    Comment: Performed at [facility], [address] [place], [place] [address]  CBC with Differential/Platelet     Status: Abnormal   Collection Time: 12/28/18 11:24 AM  Result Value Ref Range   WBC 8.5 3.8 - 10.8 Thousand/uL   RBC 5.57 4.20 - 5.80 Million/uL   Hemoglobin 17.3 (H) 13.2 - 17.1 g/dL   HCT 11.9 14.7 - 82.9 %   MCV 88.7 80.0 - 100.0 fL   MCH 31.1 27.0 - 33.0 pg   MCHC 35.0 32.0 - 36.0 g/dL   RDW 56.2 13.0 - 86.5 %   Platelets 168 140 - 400 Thousand/uL   MPV 11.6 7.5 - 12.5 fL   Neutro Abs 5,721 1,500 - 7,800 cells/uL   Lymphs Abs 1,836 850 - 3,900 cells/uL   Absolute Monocytes 808 200 - 950 cells/uL   Eosinophils Absolute 102 15 - 500 cells/uL   Basophils Absolute 34 0 - 200 cells/uL   Neutrophils Relative % 67.3 %   Total Lymphocyte 21.6 %   Monocytes Relative 9.5 %   Eosinophils Relative 1.2 %   Basophils Relative 0.4 %     No results found for this or any previous visit (from the past 72 hour(s)). No results found.    Assessment and Plan: 53 y.o. male with   .Fitzroy was seen today for migraine.  Diagnoses and all orders for this  visit:  Intractable migraine without aura and without status migrainosus -     promethazine (PHENERGAN) 12.5 MG tablet; Take 1-2 tablets (12.5-25 mg total) by mouth every 6 (six) hours as needed for nausea or vomiting. -     eletriptan (RELPAX) 40 MG tablet; Take 1 tablet (40 mg total) by mouth as needed for migraine or headache. May repeat 1 add'l dose in 2  hours if migraine persist/recurs. Max dose 80 mg/24hr -     dexamethasone (DECADRON) injection 10 mg -     ketorolac (TORADOL) 30 MG/ML injection 30 mg  Hypocalcemia -     PTH, Intact and Calcium -     Magnesium -     Phosphorus -     VITAMIN D 25 Hydroxy (Vit-D Deficiency, Fractures)  Elevated blood pressure reading  Migraine Acute right-sided headache with photophobia and 1 episode of emesis today. Typical of usual migraine. No red flags. Reassuring neuro exam Toradol 30 mg IM and Decadron 4 mg IM given in office today Phenergan prn for nausea (declined nausea medication in office) Switching from Imitrex to Relpax for abortive therapy Counseled on general headache measures. Avoid Sildenafil which will worsen migraine  Elevated BP reading Out of range on 2 readings in office today Patient counseled on self-monitoring Send MyChart message with home readings in 1 week  Personally reviewed pre-op labs dated 12/08/2018 Hypocalcemia noted to have persisted on 3 checks PTH, Ca, Mg, Phos, vitamin D pending   Patient education and anticipatory guidance given Patient agrees with treatment plan Follow-up as needed if symptoms worsen or fail to improve  Darlyne Russian PA-C

## 2019-02-14 NOTE — Patient Instructions (Signed)
Recurrent Migraine Headache    Migraines are a type of headache, and they are usually stronger and more sudden than normal headaches (tension headaches). Migraines are characterized by an intense pulsing, throbbing pain that is usually only present on one side of the head. Sometimes, migraine headaches can cause nausea, vomiting, sensitivity to light and sound, and vision changes. Recurrent migraines keep coming back (recurring). A migraine can last from 4 hours up to 3 days.  What are the causes?  The exact cause of this condition is not known. However, a migraine may be caused when nerves in the brain become irritated and release chemicals that cause inflammation of blood vessels. This inflammation causes pain.  Certain things may also trigger migraines, such as:  · A disruption in your regular eating and sleeping schedule.  · Smoking.  · Stress.  · Menstruation.  · Certain foods and drinks, such as:  ? Aged cheese.  ? Chocolate.  ? Alcohol.  ? Caffeine.  ? Foods or drinks that contain nitrates, glutamate, aspartame, MSG, or tyramine.  · Lack of sleep.  · Hunger.  · Physical exertion.  · Fatigue.  · High altitude.  · Weather changes.  · Medicines, such as:  ? Nitroglycerin, which is used to treat chest pain.  ? Birth control pills.  ? Estrogen.  ? Some blood pressure medicines.  What are the signs or symptoms?  Symptoms of this condition vary for each person and may include:  · Pain that is usually only present on one side of the head. In some cases, the pain may be on both sides of the head or around the head or neck.  · Pulsating or throbbing pain.  · Severe pain that prevents daily activities.  · Pain that is aggravated by any physical activity.  · Nausea, vomiting, or both.  · Dizziness.  · Pain with exposure to bright lights, loud noises, or activity.  · General sensitivity to bright lights, loud noises, or smells.  Before you get a migraine, you may get warning signs that a migraine is coming (aura). An aura  may include:  · Seeing flashing lights.  · Seeing bright spots, halos, or zigzag lines.  · Having tunnel vision or blurred vision.  · Having numbness or a tingling feeling.  · Having trouble talking.  · Having muscle weakness.  · Smelling a certain odor.  How is this diagnosed?  This condition is often diagnosed based on:  · Your symptoms and medical history.  · A physical exam.  You may also have tests, including:  · A CT scan or MRI of your brain. These imaging tests cannot diagnose migraines, but they can help to rule out other causes of headaches.  · Blood tests.  How is this treated?  This condition is treated with:  · Medicines. These are used for:  ? Lessening pain and nausea.  ? Preventing recurrent migraines.  · Lifestyle changes, such as changes to your diet or sleeping patterns.  · Behavior therapy, such as relaxation training or biofeedback. Biofeedback is a treatment that involves teaching you to relax and use your brain to lower your heart rate and control your breathing.  Follow these instructions at home:  Medicines  · Take over-the-counter and prescription medicines only as told by your health care provider.  · Do not drive or use heavy machinery while taking prescription pain medicine.  Lifestyle  · Do not use any products that contain nicotine or tobacco, such as   sleep recommended by your health care provider.  Limit your stress. Talk with your health care provider if you need help with stress management.  Maintain a healthy weight. If you need help losing weight, ask your health care provider.  Exercise regularly. Aim for 150 minutes of moderate-intensity exercise (walking,  biking, yoga) or 75 minutes of vigorous exercise (running, circuit training, swimming) each week. General instructions   Keep a journal to find out what triggers your migraine headaches so you can avoid these triggers. For example, write down: ? What you eat and drink. ? How much sleep you get. ? Any change to your diet or medicines.  Lie down in a dark, quiet room when you have a migraine.  Try placing a cool towel over your head when you have a migraine.  Keep lights dim, if bright lights bother you and make your migraines worse.  Keep all follow-up visits as told by your health care provider. This is important. Contact a health care provider if:  Your pain does not improve, even with medicine.  Your migraines continue to return, even with medicine.  You have a fever.  You have weight loss. Get help right away if:  Your migraine becomes severe and medicine does not help.  You have a stiff neck.  You have a loss of vision.  You have muscle weakness or loss of muscle control.  You start losing your balance or have trouble walking.  You feel faint or you pass out.  You develop new, severe symptoms.  You start having abrupt severe headaches that last for a second or less, like a thunderclap. Summary  Migraine headaches are usually stronger and more sudden than normal headaches (tension headaches). Migraines are characterized by an intense pulsing, throbbing pain that is usually only present on one side of the head.  The exact cause of this condition is not known. However, a migraine may be caused when nerves in the brain become irritated and release chemicals that cause inflammation of blood vessels.  Certain things may trigger migraines, such as changes to diet or sleeping patterns, smoking, certain foods, alcohol, stress, and certain medicines.  Sometimes, migraine headaches can cause nausea, vomiting, sensitivity to light and sound, and vision changes.  Migraines  are often diagnosed based on your symptoms, medical history, and a physical exam. This information is not intended to replace advice given to you by your health care provider. Make sure you discuss any questions you have with your health care provider. Document Released: 05/06/2001 Document Revised: 05/23/2016 Document Reviewed: 05/23/2016 Elsevier Interactive Patient Education  2019 [employer]  Hypocalcemia, Adult Hypocalcemia is when the level of calcium in a person's blood is below normal. Calcium is a mineral that is used by the body in many ways. A lack of blood calcium can affect the heart and muscles, make the bones more likely to break, and cause other problems. What are the causes? This condition may be caused by:  Decreased production (hypoparathyroidism) or improper use of parathyroid hormone.  Problems with the parathyroid glands or surgical removal of these glands.  Problems with parathyroid function after removal of the thyroid gland.  Lack (deficiency) of vitamin D or magnesium or both.  Kidney problems. Less common causes include:  Intestinal problems that interfere with nutrient absorption.  Alcoholism.  Low levels of a body protein that is called albumin.  Inflammation of the pancreas (pancreatitis).  Certain medicines.  Severe infections (sepsis).  Certain diseases, such as sarcoidosis or  hemochromatosis, that cause the parathyroid glands to be filled with cells or substances that are not normally present.  Breakdown of large amounts of muscle fiber.  High levels of phosphate in the body.  Cancer.  Massive blood transfusions, which usually occur with severe trauma. What are the signs or symptoms? Symptoms of this condition include:  Numbness and tingling in the fingers, toes, or around the mouth.  Muscle aches or cramps, especially in the legs, feet, and back.  Muscle twitches.  Abdominal cramping or pain.  Memory problems, confusion, or  difficulty thinking.  Depression, anxiety, irritability, or changes in personality.  Fainting.  Chest pain.  Difficulty swallowing.  Changes in the sound of the voice.  Shortness of breath or wheezing.  General weakness and fatigue. Symptoms of severe hypocalcemia include:  Shaking uncontrollably (seizures).  Seizure of the voice box (laryngospasm).  Fast heartbeats (palpitations) and abnormal heart rhythms (arrhythmias). Long-term symptoms of this condition include:  Coarse, brittle hair and nails.  Dry skin or lasting (chronic) skin diseases (psoriasis, eczema,, or dermatitis).  Clouding of the eye lens (cataracts). How is this diagnosed? This condition is usually diagnosed with a blood test. You may also have other tests to help determine the underlying cause of the condition. For example, a test may be done that records the electrical activity of the heart (electrocardiogram,or ECG). How is this treated? Treatment for this condition may include:  Calcium given by mouth (orally) or given through an IV tube that is inserted into one of your veins. The method used for giving calcium will depend on the severity of the condition.  Other minerals (electrolytes), such as magnesium. Other treatment will depend on the cause of the condition. Follow these instructions at home:  Follow diet instructions from your health care provider or dietitian.  Take supplements only as told by your health care provider.  Keep all follow-up visits as told by your health care provider. This is important. Contact a health care provider if:  You have increased fatigue.  You have increased muscle twitching.  You have new swelling in the feet, ankles, or legs.  You develop changes in mood, memory, or personality. Get help right away if:  You have chest pain.  You have persistent rapid or irregular heartbeats.  You have difficulty breathing.  You faint.  You start to have  seizures.  You have confusion. This information is not intended to replace advice given to you by your health care provider. Make sure you discuss any questions you have with your health care provider. Document Released: 01/29/2010 Document Revised: 01/17/2016 Document Reviewed: 12/27/2014 Elsevier Interactive Patient Education  2019 [employer].

## 2019-02-15 LAB — MAGNESIUM: Magnesium: 1.9 mg/dL (ref 1.5–2.5)

## 2019-02-15 LAB — PTH, INTACT AND CALCIUM
Calcium: 10.1 mg/dL (ref 8.6–10.3)
PTH: 13 pg/mL — ABNORMAL LOW (ref 14–64)

## 2019-02-15 LAB — VITAMIN D 25 HYDROXY (VIT D DEFICIENCY, FRACTURES): Vit D, 25-Hydroxy: 65 ng/mL (ref 30–100)

## 2019-02-15 LAB — PHOSPHORUS: Phosphorus: 3.4 mg/dL (ref 2.5–4.5)

## 2019-02-20 ENCOUNTER — Other Ambulatory Visit: Payer: Self-pay | Admitting: Sports Medicine

## 2019-02-20 DIAGNOSIS — S46012A Strain of muscle(s) and tendon(s) of the rotator cuff of left shoulder, initial encounter: Secondary | ICD-10-CM

## 2019-03-02 ENCOUNTER — Encounter: Payer: Self-pay | Admitting: Physician Assistant

## 2019-03-02 ENCOUNTER — Other Ambulatory Visit: Payer: Self-pay

## 2019-03-02 ENCOUNTER — Ambulatory Visit (INDEPENDENT_AMBULATORY_CARE_PROVIDER_SITE_OTHER): Payer: BC Managed Care – PPO | Admitting: Family Medicine

## 2019-03-02 VITALS — Ht 68.0 in | Wt 183.0 lb

## 2019-03-02 DIAGNOSIS — Z20828 Contact with and (suspected) exposure to other viral communicable diseases: Secondary | ICD-10-CM

## 2019-03-02 DIAGNOSIS — Z20822 Contact with and (suspected) exposure to covid-19: Secondary | ICD-10-CM

## 2019-03-02 NOTE — Progress Notes (Signed)
Virtual Visit  via Video Note  I connected with      Earl [name] by a video enabled telemedicine application and verified that I am speaking with the correct person using two identifiers.   I discussed the limitations of evaluation and management by telemedicine and the availability of in person appointments. The patient expressed understanding and agreed to proceed.  History of Present Illness: Earl [name] is a 53 y.o. male who would like to discuss COVID exposure.  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  [place]  His colleague is completely asymptomatic for COVID symptoms however he discussed the situation with his medical provider.  His colleague is currently being tested for COVID although he is not symptomatic.  His colic does not have any known COVID exposure [place] relatively high in COVID-19 cases.  Earl [name] wants to know if he needs COVID testing or needs to quarantine.  He like to be able to work.  He had a recent negative test and prior to surgery about 2 weeks ago.   Observations/Objective: Ht 5\' 8"  (1.727 m)   Wt 183 lb (83 kg)   BMI 27.83 kg/m  Wt Readings from Last 5 Encounters:  03/02/19 183 lb (83 kg)  02/14/19 189 lb (85.7 kg)  01/26/19 186 lb (84.4 kg)  12/09/18 184 lb (83.5 kg)  12/04/18 186 lb 11.7 oz (84.7 kg)   Exam: Appearance nontoxic no acute distress appearing Normal Speech.  No tachypnea or shortness of breath.  Lab and Radiology Results No results found for this or any previous visit (from the past 72 hour(s)). No results found.   Assessment and Plan: 53 y.o. male with digital COVID exposure.  Earl [name]'s risk is quite low.  He has what is effectively a 3rd degree possible exposure.  I think his risk is so low that it is reasonable for him to continue to work.  His job is low risk  for exposing other people as he typically drives by himself and does not have contact with multiple other people.  I think as long as he practices good hygiene including mask use and good hand hygiene it is okay for to work.  However if he becomes symptomatic or his colleague test positive then he should quarantine and we should test.  He agrees with the plan and proceed with watchful waiting.  PDMP not reviewed this encounter. No orders of the defined types were placed in this encounter.  No orders of the defined types were placed in this encounter.   Follow Up Instructions:    I discussed the assessment and treatment plan with the patient. The patient was provided an opportunity to ask questions and all were answered. The patient agreed with the plan and demonstrated an understanding of the instructions.   The patient was advised to call back or seek an in-person evaluation if the symptoms worsen or if the condition fails to improve as anticipated.  Time: 15 minutes of intraservice time, with >22 minutes of total time during today's visit.      Historical information moved to improve visibility of documentation.  Past Medical History:  Diagnosis Date  . Acute urinary retention   . Arthritis   . Depression   . Hemorrhoids   . History of skin cancer in adulthood 2012  chest wall  . Hypertension   . Hypogonadism, male   . Insomnia    Past Surgical History:  Procedure Laterality Date  . APPENDECTOMY    . COLONOSCOPY    . HERNIA REPAIR    . JOINT REPLACEMENT    . OSTEOTOMY    . SHOULDER ARTHROSCOPY Left 06/2018  . TOTAL HIP ARTHROPLASTY Right   . TOTAL KNEE ARTHROPLASTY Left    Social History   Tobacco Use  . Smoking status: Never Smoker  . Smokeless tobacco: Never Used  Substance Use Topics  . Alcohol use: Not Currently   family history includes Aneurysm in his brother; Heart disease in his father and sister; Hypertension in his father and sister; Skin cancer in his  father; Stroke in his mother.  Medications: Current Outpatient Medications  Medication Sig Dispense Refill  . Eszopiclone (ESZOPICLONE) 3 MG TABS Take 1 tablet (3 mg total) by mouth at bedtime. 30 tablet 0  . Multiple Vitamins-Minerals (MULTIVITAL PO) Take 1 tablet by mouth daily.     Earl [name] testosterone cypionate (DEPOTESTOSTERONE CYPIONATE) 200 MG/ML injection Inject 200 mg into the muscle every 14 (fourteen) days.     Earl [name] eletriptan (RELPAX) 40 MG tablet Take 1 tablet (40 mg total) by mouth as needed for migraine or headache. May repeat 1 add'l dose in 2 hours if migraine persist/recurs. Max dose 80 mg/24hr (Patient not taking: Reported on 03/02/2019) 9 tablet 1   No current facility-administered medications for this visit.    Allergies  Allergen Reactions  . Iodinated Diagnostic Agents Swelling    N/V - rash- red skin   . Morphine And Related Other (See Comments)    Irritability   . Temazepam Other (See Comments)    Mood change, irritability

## 2019-03-04 ENCOUNTER — Encounter: Payer: Self-pay | Admitting: Physician Assistant

## 2019-03-04 ENCOUNTER — Other Ambulatory Visit: Payer: Self-pay

## 2019-03-04 ENCOUNTER — Ambulatory Visit (INDEPENDENT_AMBULATORY_CARE_PROVIDER_SITE_OTHER): Payer: BC Managed Care – PPO | Admitting: Physician Assistant

## 2019-03-04 VITALS — BP 124/78 | HR 94 | Temp 98.5°F | Wt 186.0 lb

## 2019-03-04 DIAGNOSIS — A4902 Methicillin resistant Staphylococcus aureus infection, unspecified site: Secondary | ICD-10-CM | POA: Diagnosis not present

## 2019-03-04 DIAGNOSIS — Z8614 Personal history of Methicillin resistant Staphylococcus aureus infection: Secondary | ICD-10-CM

## 2019-03-04 DIAGNOSIS — L02412 Cutaneous abscess of left axilla: Secondary | ICD-10-CM | POA: Diagnosis not present

## 2019-03-04 MED ORDER — CLINDAMYCIN HCL 150 MG PO CAPS: 450.0000 mg | ORAL_CAPSULE | Freq: Three times a day (TID) | ORAL | 0 refills | Status: DC

## 2019-03-04 NOTE — Patient Instructions (Addendum)
Keep area covered for next 12-24 hours After 24 hours you can gently remove about 1/4" of packing Return on Monday for packing removal  Incision and Drainage, Care After This sheet gives you information about how to care for yourself after your procedure. Your health care provider may also give you more specific instructions. If you have problems or questions, contact your health care provider. What can I expect after the procedure? After the procedure, it is common to have:  Pain or discomfort around the incision site.  Blood, fluid, or pus (drainage) from the incision.  Redness and firm skin around the incision site. Follow these instructions at home: Medicines  Take over-the-counter and prescription medicines only as told by your health care provider.  If you were prescribed an antibiotic medicine, use or take it as told by your health care provider. Do not stop using the antibiotic even if you start to feel better. Wound care Follow instructions from your health care provider about how to take care of your wound. Make sure you:  Wash your hands with soap and water before and after you change your bandage (dressing). If soap and water are not available, use hand sanitizer.  Change your dressing and packing as told by your health care provider. ? If your dressing is dry or stuck when you try to remove it, moisten or wet the dressing with saline or water so that it can be removed without harming your skin or tissues. ? If your wound is packed, leave it in place until your health care provider tells you to remove it. To remove the packing, moisten or wet the packing with saline or water so that it can be removed without harming your skin or tissues.  Leave stitches (sutures), skin glue, or adhesive strips in place. These skin closures may need to stay in place for 2 weeks or longer. If adhesive strip edges start to loosen and curl up, you may trim the loose edges. Do not remove adhesive  strips completely unless your health care provider tells you to do that. Check your wound every day for signs of infection. Check for:  More redness, swelling, or pain.  More fluid or blood.  Warmth.  Pus or a bad smell. If you were sent home with a drain tube in place, follow instructions from your health care provider about:  How to empty it.  How to care for it at home.  General instructions  Rest the affected area.  Do not take baths, swim, or use a hot tub until your health care provider approves. Ask your health care provider if you may take showers. You may only be allowed to take sponge baths.  Return to your normal activities as told by your health care provider. Ask your health care provider what activities are safe for you. Your health care provider may put you on activity or lifting restrictions.  The incision will continue to drain. It is normal to have some clear or slightly bloody drainage. The amount of drainage should lessen each day.  Do not apply any creams, ointments, or liquids unless you have been told to by your health care provider.  Keep all follow-up visits as told by your health care provider. This is important. Contact a health care provider if:  Your cyst or abscess returns.  You have a fever or chills.  You have more redness, swelling, or pain around your incision.  You have more fluid or blood coming from your incision.  Your incision feels warm to the touch.  You have pus or a bad smell coming from your incision.  You have red streaks above or below the incision site. Get help right away if:  You have severe pain or bleeding.  You cannot eat or drink without vomiting.  You have decreased urine output.  You become short of breath.  You have chest pain.  You cough up blood.  The affected area becomes numb or starts to tingle. These symptoms may represent a serious problem that is an emergency. Do not wait to see if the symptoms  will go away. Get medical help right away. Call your local emergency services (911 in the [place] Do not drive yourself to the hospital. Summary  After this procedure, it is common to have fluid, blood, or pus coming from the surgery site.  Follow all home care instructions. You will be told how to take care of your incision, how to check for infection, and how to take medicines.  If you were prescribed an antibiotic medicine, take it as told by your health care provider. Do not stop taking the antibiotic even if you start to feel better.  Contact a health care provider if you have increased redness, swelling, or pain around your incision. Get help right away if you have chest pain, you vomit, you cough up blood, or you have shortness of breath.  Keep all follow-up visits as told by your health care provider. This is important. This information is not intended to replace advice given to you by your health care provider. Make sure you discuss any questions you have with your health care provider. Document Released: 11/03/2011 Document Revised: 07/12/2018 Document Reviewed: 07/12/2018 Elsevier Patient Education  2020 [employer]   Skin Abscess  A skin abscess is an infected area on or under your skin that contains a collection of pus and other material. An abscess may also be called a furuncle, carbuncle, or boil. An abscess can occur in or on almost any part of your body. Some abscesses break open (rupture) on their own. Most continue to get worse unless they are treated. The infection can spread deeper into the body and eventually into your blood, which can make you feel ill. Treatment usually involves draining the abscess. What are the causes? An abscess occurs when germs, like bacteria, pass through your skin and cause an infection. This may be caused by:  A scrape or cut on your skin.  A puncture wound through your skin, including a needle injection or insect bite.  Blocked oil or  sweat glands.  Blocked and infected hair follicles.  A cyst that forms beneath your skin (sebaceous cyst) and becomes infected. What increases the risk? This condition is more likely to develop in people who:  Have a weak body defense system (immune system).  Have diabetes.  Have dry and irritated skin.  Get frequent injections or use illegal IV drugs.  Have a foreign body in a wound, such as a splinter.  Have problems with their lymph system or veins. What are the signs or symptoms? Symptoms of this condition include:  A painful, firm bump under the skin.  A bump with pus at the top. This may break through the skin and drain. Other symptoms include:  Redness surrounding the abscess site.  Warmth.  Swelling of the lymph nodes (glands) near the abscess.  Tenderness.  A sore on the skin. How is this diagnosed? This condition may be diagnosed based  on:  A physical exam.  Your medical history.  A sample of pus. This may be used to find out what is causing the infection.  Blood tests.  Imaging tests, such as an ultrasound, CT scan, or MRI. How is this treated? A small abscess that drains on its own may not need treatment. Treatment for larger abscesses may include:  Moist heat or heat pack applied to the area several times a day.  A procedure to drain the abscess (incision and drainage).  Antibiotic medicines. For a severe abscess, you may first get antibiotics through an IV and then change to antibiotics by mouth. Follow these instructions at home: Medicines   Take over-the-counter and prescription medicines only as told by your health care provider.  If you were prescribed an antibiotic medicine, take it as told by your health care provider. Do not stop taking the antibiotic even if you start to feel better. Abscess care   If you have an abscess that has not drained, apply heat to the affected area. Use the heat source that your health care provider  recommends, such as a moist heat pack or a heating pad. ? Place a towel between your skin and the heat source. ? Leave the heat on for 20-30 minutes. ? Remove the heat if your skin turns bright red. This is especially important if you are unable to feel pain, heat, or cold. You may have a greater risk of getting burned.  Follow instructions from your health care provider about how to take care of your abscess. Make sure you: ? Cover the abscess with a bandage (dressing). ? Change your dressing or gauze as told by your health care provider. ? Wash your hands with soap and water before you change the dressing or gauze. If soap and water are not available, use hand sanitizer.  Check your abscess every day for signs of a worsening infection. Check for: ? More redness, swelling, or pain. ? More fluid or blood. ? Warmth. ? More pus or a bad smell. General instructions  To avoid spreading the infection: ? Do not share personal care items, towels, or hot tubs with others. ? Avoid making skin contact with other people.  Keep all follow-up visits as told by your health care provider. This is important. Contact a health care provider if you have:  More redness, swelling, or pain around your abscess.  More fluid or blood coming from your abscess.  Warm skin around your abscess.  More pus or a bad smell coming from your abscess.  A fever.  Muscle aches.  Chills or a general ill feeling. Get help right away if you:  Have severe pain.  See red streaks on your skin spreading away from the abscess. Summary  A skin abscess is an infected area on or under your skin that contains a collection of pus and other material.  A small abscess that drains on its own may not need treatment.  Treatment for larger abscesses may include having a procedure to drain the abscess and taking an antibiotic. This information is not intended to replace advice given to you by your health care provider. Make  sure you discuss any questions you have with your health care provider. Document Released: 05/21/2005 Document Revised: 12/02/2018 Document Reviewed: 09/24/2017 Elsevier Patient Education  2020 [employer].

## 2019-03-04 NOTE — Progress Notes (Signed)
HPI:                                                                Earl [name] is a 53 y.o. male who presents to [facility] [place] today for skin infection   Patient with recent MRSA cellulitis of left nasal wall requiring hospitalization (12/04/2018-12/08/18) presents with a painful, draining mass of his left under arm area. Noted redness beginning on Wednesday 2 days ago. Symptoms rapidly worsened with development of a pimple-like head with small spontaneous purulent drainage yesterday. Last night he had chills and worsening pain. Denies fever.  Prior wound culture from left nasal wall was resistant to multiple antibiotics and required treatment with Zyvox.  Past Medical History:  Diagnosis Date  . Acute urinary retention   . Arthritis   . Depression   . Hemorrhoids   . History of skin cancer in adulthood 2012   chest wall  . Hypertension   . Hypogonadism, male   . Insomnia    Past Surgical History:  Procedure Laterality Date  . APPENDECTOMY    . COLONOSCOPY    . HERNIA REPAIR    . JOINT REPLACEMENT    . OSTEOTOMY    . SHOULDER ARTHROSCOPY Left 06/2018  . TOTAL HIP ARTHROPLASTY Right   . TOTAL KNEE ARTHROPLASTY Left    Social History   Tobacco Use  . Smoking status: Never Smoker  . Smokeless tobacco: Never Used  Substance Use Topics  . Alcohol use: Not Currently   family history includes Aneurysm in his brother; Heart disease in his father and sister; Hypertension in his father and sister; Skin cancer in his father; Stroke in his mother.    ROS: negative except as noted in the HPI  Medications: Current Outpatient Medications  Medication Sig Dispense Refill  . eletriptan (RELPAX) 40 MG tablet Take 1 tablet (40 mg total) by mouth as needed for migraine or headache. May repeat 1 add'l dose in 2 hours if migraine persist/recurs. Max dose 80 mg/24hr (Patient not taking: Reported on 03/02/2019) 9 tablet 1  . Eszopiclone  (ESZOPICLONE) 3 MG TABS Take 1 tablet (3 mg total) by mouth at bedtime. 30 tablet 0  . Multiple Vitamins-Minerals (MULTIVITAL PO) Take 1 tablet by mouth daily.     Marland Kitchen testosterone cypionate (DEPOTESTOSTERONE CYPIONATE) 200 MG/ML injection Inject 200 mg into the muscle every 14 (fourteen) days.      No current facility-administered medications for this visit.    Allergies  Allergen Reactions  . Iodinated Diagnostic Agents Swelling    N/V - rash- red skin   . Morphine And Related Other (See Comments)    Irritability   . Temazepam Other (See Comments)    Mood change, irritability       Objective:  BP 124/78   Pulse 94   Temp 98.5 F (36.9 C) (Oral)   Wt 186 lb (84.4 kg)   BMI 28.28 kg/m  Gen:  alert, not ill-appearing, no distress, appropriate for age HEENT: head normocephalic without obvious abnormality, conjunctiva and cornea clear, trachea midline Pulm: Normal work of breathing, normal phonation Neuro: alert and oriented x 3, no tremor MSK: extremities atraumatic, normal gait and station Skin:left inferior axilla/upper flank there is approx 1.5 cm  fluctuant mass with approx 3.5 cm area of surrounding redness and induration  Incision and Drainage Procedure Note  Pre-operative Diagnosis: abscess, left axilla  Post-operative Diagnosis: same  Indications: infection, pain  Anesthesia: 1% lidocaine with epinephrine  Procedure Details  The procedure, risks and complications have been discussed in detail (including, but not limited to airway compromise, infection, bleeding) with the patient, and the patient has signed consent to the procedure.  The skin was sterilely prepped and draped over the affected area in the usual fashion. After adequate local anesthesia, I&D with a #11 blade was performed on the left inferior axilla. Purulent drainage: present The patient was observed until stable. Approx 2.5 inches of iodoform packing placed leaving a 1-inch tail. Hemostasis  achieved.   Condition: Tolerated procedure well   Complications: none.     No results found for this or any previous visit (from the past 72 hour(s)). No results found.    Assessment and Plan: 53 y.o. male with   .Earl [name] was seen today for abscess.  Diagnoses and all orders for this visit:  Abscess of left axilla -     Wound culture -     clindamycin (CLEOCIN) 150 MG capsule; Take 3 capsules (450 mg total) by mouth 3 (three) times daily for 7 days.  History of methicillin resistant staphylococcus aureus (MRSA) -     Wound culture  I&D performed in office today (see procedure note) Given hx of MRSA resistant to multiple antibiotics obtaining wound culture today Start Clindamycin 450 mg tid Counseled on wound care   Patient education and anticipatory guidance given Patient agrees with treatment plan Follow-up in 3 days for wound check/packing removal or sooner as needed if symptoms worsen or fail to improve  Levonne Hubertharley E. Evonte Prestage PA-C

## 2019-03-07 ENCOUNTER — Ambulatory Visit (INDEPENDENT_AMBULATORY_CARE_PROVIDER_SITE_OTHER): Payer: BC Managed Care – PPO | Admitting: Physician Assistant

## 2019-03-07 ENCOUNTER — Other Ambulatory Visit: Payer: Self-pay

## 2019-03-07 ENCOUNTER — Encounter: Payer: Self-pay | Admitting: Physician Assistant

## 2019-03-07 VITALS — BP 122/80 | HR 90 | Temp 99.2°F | Wt 190.0 lb

## 2019-03-07 DIAGNOSIS — L02219 Cutaneous abscess of trunk, unspecified: Secondary | ICD-10-CM | POA: Insufficient documentation

## 2019-03-07 DIAGNOSIS — L03319 Cellulitis of trunk, unspecified: Secondary | ICD-10-CM | POA: Diagnosis not present

## 2019-03-07 DIAGNOSIS — Z5181 Encounter for therapeutic drug level monitoring: Secondary | ICD-10-CM | POA: Diagnosis not present

## 2019-03-07 DIAGNOSIS — Z8614 Personal history of Methicillin resistant Staphylococcus aureus infection: Secondary | ICD-10-CM | POA: Insufficient documentation

## 2019-03-07 DIAGNOSIS — L02412 Cutaneous abscess of left axilla: Secondary | ICD-10-CM | POA: Diagnosis not present

## 2019-03-07 LAB — WOUND CULTURE
MICRO NUMBER:: 656639
SPECIMEN QUALITY:: ADEQUATE

## 2019-03-07 LAB — CBC WITH DIFFERENTIAL/PLATELET
Absolute Monocytes: 1227 cells/uL — ABNORMAL HIGH (ref 200–950)
Basophils Absolute: 56 cells/uL (ref 0–200)
Basophils Relative: 0.4 %
Eosinophils Absolute: 127 cells/uL (ref 15–500)
Eosinophils Relative: 0.9 %
HCT: 41 % (ref 38.5–50.0)
Hemoglobin: 14.5 g/dL (ref 13.2–17.1)
Lymphs Abs: 1156 cells/uL (ref 850–3900)
MCH: 31.5 pg (ref 27.0–33.0)
MCHC: 35.4 g/dL (ref 32.0–36.0)
MCV: 89.1 fL (ref 80.0–100.0)
MPV: 11.5 fL (ref 7.5–12.5)
Monocytes Relative: 8.7 %
Neutro Abs: 11534 cells/uL — ABNORMAL HIGH (ref 1500–7800)
Neutrophils Relative %: 81.8 %
Platelets: 172 10*3/uL (ref 140–400)
RBC: 4.6 10*6/uL (ref 4.20–5.80)
RDW: 12 % (ref 11.0–15.0)
Total Lymphocyte: 8.2 %
WBC: 14.1 10*3/uL — ABNORMAL HIGH (ref 3.8–10.8)

## 2019-03-07 LAB — COMPLETE METABOLIC PANEL WITH GFR
AG Ratio: 1.5 (calc) (ref 1.0–2.5)
ALT: 19 U/L (ref 9–46)
AST: 21 U/L (ref 10–35)
Albumin: 4 g/dL (ref 3.6–5.1)
Alkaline phosphatase (APISO): 77 U/L (ref 35–144)
BUN: 22 mg/dL (ref 7–25)
CO2: 28 mmol/L (ref 20–32)
Calcium: 9.2 mg/dL (ref 8.6–10.3)
Chloride: 100 mmol/L (ref 98–110)
Creat: 0.89 mg/dL (ref 0.70–1.33)
GFR, Est African American: 112 mL/min/{1.73_m2} (ref >=60)
GFR, Est Non African American: 97 mL/min/{1.73_m2} (ref >=60)
Globulin: 2.6 g/dL (calc) (ref 1.9–3.7)
Glucose, Bld: 104 mg/dL — ABNORMAL HIGH (ref 65–99)
Potassium: 3.8 mmol/L (ref 3.5–5.3)
Sodium: 137 mmol/L (ref 135–146)
Total Bilirubin: 0.6 mg/dL (ref 0.2–1.2)
Total Protein: 6.6 g/dL (ref 6.1–8.1)

## 2019-03-07 MED ORDER — CEFTRIAXONE SODIUM 250 MG IJ SOLR
250.0000 mg | Freq: Once | INTRAMUSCULAR | Status: AC
  Administered 2019-03-07: 09:00:00 250 mg via INTRAMUSCULAR

## 2019-03-07 MED ORDER — KETOROLAC TROMETHAMINE 30 MG/ML IJ SOLN
30.0000 mg | Freq: Once | INTRAMUSCULAR | Status: AC
  Administered 2019-03-07: 30 mg via INTRAMUSCULAR

## 2019-03-07 MED ORDER — OXYCODONE-ACETAMINOPHEN 5-325 MG PO TABS: 1.0000 | ORAL_TABLET | Freq: Every evening | ORAL | 0 refills | Status: DC | PRN

## 2019-03-07 MED ORDER — LINEZOLID 600 MG PO TABS: 600.0000 mg | ORAL_TABLET | Freq: Two times a day (BID) | ORAL | 0 refills | Status: DC

## 2019-03-07 MED ORDER — KETOROLAC TROMETHAMINE 10 MG PO TABS: 10.0000 mg | ORAL_TABLET | Freq: Four times a day (QID) | ORAL | 0 refills | Status: DC | PRN

## 2019-03-07 NOTE — Progress Notes (Signed)
HPI:                                                                Earl [name] is a 53 y.o. male [name] today for wound check  Patient underwent I&D on 03/04/19 for abscess of left axilla. He has been taking Clindamycin for MRSA coverage. Here today for wound check/packing removal. His wound culture is still pending. Preliminary report shows gram positive cocci in pairs.  Reports today he is in "tremendous pain." Reports redness has spread and area feels warm to the touch. He states he is changing his bandage every 3 hours due to drainage.   He has a history of MRSA cellulitis of nasal wall that required hospitalization (12/04/2018-12/08/18) and Zyvox  Past Medical History:  Diagnosis Date  . Acute urinary retention   . Arthritis   . Depression   . Hemorrhoids   . History of skin cancer in adulthood 2012   chest wall  . Hypertension   . Hypogonadism, male   . Insomnia    Past Surgical History:  Procedure Laterality Date  . APPENDECTOMY    . COLONOSCOPY    . HERNIA REPAIR    . JOINT REPLACEMENT    . OSTEOTOMY    . SHOULDER ARTHROSCOPY Left 06/2018  . TOTAL HIP ARTHROPLASTY Right   . TOTAL KNEE ARTHROPLASTY Left    Social History   Tobacco Use  . Smoking status: Never Smoker  . Smokeless tobacco: Never Used  Substance Use Topics  . Alcohol use: Not Currently   family history includes Aneurysm in his brother; Heart disease in his father and sister; Hypertension in his father and sister; Skin cancer in his father; Stroke in his mother.    ROS: negative except as noted in the HPI  Medications: Current Outpatient Medications  Medication Sig Dispense Refill  . eletriptan (RELPAX) 40 MG tablet Take 1 tablet (40 mg total) by mouth as needed for migraine or headache. May repeat 1 add'l dose in 2 hours if migraine persist/recurs. Max dose 80 mg/24hr 9 tablet 1  . Eszopiclone (ESZOPICLONE) 3 MG TABS Take  1 tablet (3 mg total) by mouth at bedtime. 30 tablet 0  . Multiple Vitamins-Minerals (MULTIVITAL PO) Take 1 tablet by mouth daily.     Marland Kitchen. testosterone cypionate (DEPOTESTOSTERONE CYPIONATE) 200 MG/ML injection Inject 200 mg into the muscle every 14 (fourteen) days.     Marland Kitchen. ketorolac (TORADOL) 10 MG tablet Take 1 tablet (10 mg total) by mouth every 6 (six) hours as needed for moderate pain. 20 tablet 0  . linezolid (ZYVOX) 600 MG tablet Take 1 tablet (600 mg total) by mouth 2 (two) times daily for 10 days. 20 tablet 0  . oxyCODONE-acetaminophen (PERCOCET/ROXICET) 5-325 MG tablet Take 1 tablet by mouth at bedtime as needed for up to 5 days for severe pain. 10 tablet 0   No current facility-administered medications for this visit.    Allergies  Allergen Reactions  . Iodinated Diagnostic Agents Swelling    N/V - rash- red skin   . Morphine And Related Other (See Comments)    Irritability   . Temazepam Other (See Comments)    Mood change, irritability  Objective:  BP 122/80   Pulse 90   Temp 99.2 F (37.3 C) (Oral)   Wt 190 lb (86.2 kg)   BMI 28.89 kg/m  Gen:  alert, not ill-appearing, no distress, appropriate for age HEENT: head normocephalic without obvious abnormality, conjunctiva and cornea clear, trachea midline Pulm: Normal work of breathing, normal phonation Neuro: alert and oriented x 3, no tremor MSK: extremities atraumatic, normal gait and station Skin: approx 16 cm x 12 cm area of redness and induration; packing removed to reveal incision site is draining moderate amount purulent material      Results for orders placed or performed in visit on 03/04/19 (from the past 72 hour(s))  Wound culture     Status: None (Preliminary result)   Collection Time: 03/04/19 11:38 AM   Specimen: Wound  Result Value Ref Range   MICRO NUMBER: [id number]    SPECIMEN QUALITY: Adequate    SOURCE: NOT GIVEN    STATUS: PRELIMINARY    GRAM STAIN:      No white blood cells seen Rare  epithelial cells Few Gram positive cocci in pairs   No results found.    Assessment and Plan: 53 y.o. male with   .Earl [name] was seen today for follow-up.  Diagnoses and all orders for this visit:  Abscess of left axilla -     CBC with Differential/Platelet -     COMPLETE METABOLIC PANEL WITH GFR -     linezolid (ZYVOX) 600 MG tablet; Take 1 tablet (600 mg total) by mouth 2 (two) times daily for 10 days. -     ketorolac (TORADOL) 10 MG tablet; Take 1 tablet (10 mg total) by mouth every 6 (six) hours as needed for moderate pain. -     oxyCODONE-acetaminophen (PERCOCET/ROXICET) 5-325 MG tablet; Take 1 tablet by mouth at bedtime as needed for up to 5 days for severe pain. -     cefTRIAXone (ROCEPHIN) injection 250 mg -     ketorolac (TORADOL) 30 MG/ML injection 30 mg  History of methicillin resistant staphylococcus aureus (MRSA)  Cellulitis and abscess of trunk -     CBC with Differential/Platelet -     COMPLETE METABOLIC PANEL WITH GFR -     linezolid (ZYVOX) 600 MG tablet; Take 1 tablet (600 mg total) by mouth 2 (two) times daily for 10 days. -     ketorolac (TORADOL) 10 MG tablet; Take 1 tablet (10 mg total) by mouth every 6 (six) hours as needed for moderate pain. -     oxyCODONE-acetaminophen (PERCOCET/ROXICET) 5-325 MG tablet; Take 1 tablet by mouth at bedtime as needed for up to 5 days for severe pain. -     cefTRIAXone (ROCEPHIN) injection 250 mg -     ketorolac (TORADOL) 30 MG/ML injection 30 mg  Medication monitoring encounter -     CBC with Differential/Platelet -     COMPLETE METABOLIC PANEL WITH GFR  Vitals reviewed. Low grade temp 99.2. No tachycardia. No tachypnea, BP normotensive On exam, cellulitis has worsened. Packing removed today, additional purulence expressed, abscess irrigated with NS, clean dressing applied in office Wound cx is still pending Given his recent history of MRSA requiring hospitalization, will re-start Linezolid and Rocephin 250 mg given in office  today. D/C Clindamycin CMP, CBC pending Toradol for pain control Percocet prn for severe/nighttime pain. Counseled patient not to drive/operate machinery while taking this medicaiton   Patient education and anticipatory guidance given Patient agrees with treatment plan Follow-up in 1-2 days  for wound check or sooner as needed if symptoms worsen or fail to improve  Darlyne Russian PA-C

## 2019-03-08 ENCOUNTER — Ambulatory Visit (INDEPENDENT_AMBULATORY_CARE_PROVIDER_SITE_OTHER): Payer: BC Managed Care – PPO | Admitting: Physician Assistant

## 2019-03-08 ENCOUNTER — Encounter: Payer: Self-pay | Admitting: Physician Assistant

## 2019-03-08 VITALS — BP 157/79 | HR 90 | Temp 97.9°F

## 2019-03-08 DIAGNOSIS — L03319 Cellulitis of trunk, unspecified: Secondary | ICD-10-CM

## 2019-03-08 DIAGNOSIS — L02412 Cutaneous abscess of left axilla: Secondary | ICD-10-CM | POA: Diagnosis not present

## 2019-03-08 DIAGNOSIS — L02219 Cutaneous abscess of trunk, unspecified: Secondary | ICD-10-CM | POA: Diagnosis not present

## 2019-03-08 MED ORDER — KETOROLAC TROMETHAMINE 30 MG/ML IJ SOLN
30.0000 mg | Freq: Once | INTRAMUSCULAR | Status: AC
  Administered 2019-03-08: 30 mg via INTRAMUSCULAR

## 2019-03-08 NOTE — Progress Notes (Signed)
HPI:                                                                Earl [name] is a 53 y.o. male who [name] today for wound check  Patient underwent I&D on 03/04/19 for abscess of left axilla. He has been taking Clindamycin for MRSA coverage. Here today for wound check/packing removal. His wound culture is still pending. Preliminary report shows gram positive cocci in pairs.  Reports today he is in "tremendous pain." Reports redness has spread and area feels warm to the touch. He states he is changing his bandage every 3 hours due to drainage.   He has a history of MRSA cellulitis of nasal wall that required hospitalization (12/04/2018-12/08/18) and Zyvox  Interval hx 03/08/19 - He was switched to Linezolid yesterday and has had 3 doses - CBC showed leukocytosis with left shift (neutrophils 11,534) - wound cx was positive for MRSA that was sensitive to Clindamycin, however, patients cellulitis was worsening while taking Clindamycin, so he was switched to Linezolid - still having significant pain and purulent drainage from incision site. Reports IM Toradol provides the most significant pain relief - denies fever or malaise today   Past Medical History:  Diagnosis Date  . Acute urinary retention   . Arthritis   . Depression   . Hemorrhoids   . History of skin cancer in adulthood 2012   chest wall  . Hypertension   . Hypogonadism, male   . Insomnia    Past Surgical History:  Procedure Laterality Date  . APPENDECTOMY    . COLONOSCOPY    . HERNIA REPAIR    . JOINT REPLACEMENT    . OSTEOTOMY    . SHOULDER ARTHROSCOPY Left 06/2018  . TOTAL HIP ARTHROPLASTY Right   . TOTAL KNEE ARTHROPLASTY Left    Social History   Tobacco Use  . Smoking status: Never Smoker  . Smokeless tobacco: Never Used  Substance Use Topics  . Alcohol use: Not Currently   family history includes Aneurysm in his brother; Heart disease  in his father and sister; Hypertension in his father and sister; Skin cancer in his father; Stroke in his mother.    ROS: negative except as noted in the HPI  Medications: Current Outpatient Medications  Medication Sig Dispense Refill  . eletriptan (RELPAX) 40 MG tablet Take 1 tablet (40 mg total) by mouth as needed for migraine or headache. May repeat 1 add'l dose in 2 hours if migraine persist/recurs. Max dose 80 mg/24hr 9 tablet 1  . Eszopiclone (ESZOPICLONE) 3 MG TABS Take 1 tablet (3 mg total) by mouth at bedtime. 30 tablet 0  . ketorolac (TORADOL) 10 MG tablet Take 1 tablet (10 mg total) by mouth every 6 (six) hours as needed for moderate pain. 20 tablet 0  . linezolid (ZYVOX) 600 MG tablet Take 1 tablet (600 mg total) by mouth 2 (two) times daily for 10 days. 20 tablet 0  . Multiple Vitamins-Minerals (MULTIVITAL PO) Take 1 tablet by mouth daily.     Marland Kitchen. oxyCODONE-acetaminophen (PERCOCET/ROXICET) 5-325 MG tablet Take 1 tablet by mouth at bedtime as needed for up to 5 days for severe pain. 10 tablet 0  . testosterone  cypionate (DEPOTESTOSTERONE CYPIONATE) 200 MG/ML injection Inject 200 mg into the muscle every 14 (fourteen) days.      No current facility-administered medications for this visit.    Allergies  Allergen Reactions  . Iodinated Diagnostic Agents Swelling    N/V - rash- red skin   . Morphine And Related Other (See Comments)    Irritability   . Temazepam Other (See Comments)    Mood change, irritability       Objective:  BP (!) 157/79   Pulse 90   Temp 97.9 F (36.6 C)  Gen:  alert, not ill-appearing, no distress, appropriate for age HEENT: head normocephalic without obvious abnormality, conjunctiva and cornea clear, trachea midline Pulm: Normal work of breathing, normal phonation Neuro: alert and oriented x 3, no tremor MSK: extremities atraumatic, normal gait and station Skin: approx 12 cm x 8 cm area of redness and induration; large amount of purulent  drainage expressed today      Results for orders placed or performed in visit on 03/07/19 (from the past 72 hour(s))  CBC with Differential/Platelet     Status: Abnormal   Collection Time: 03/07/19  9:09 AM  Result Value Ref Range   WBC 14.1 (H) 3.8 - 10.8 Thousand/uL   RBC 4.60 4.20 - 5.80 Million/uL   Hemoglobin 14.5 13.2 - 17.1 g/dL   HCT 16.141.0 09.638.5 - 04.550.0 %   MCV 89.1 80.0 - 100.0 fL   MCH 31.5 27.0 - 33.0 pg   MCHC 35.4 32.0 - 36.0 g/dL   RDW 40.912.0 81.111.0 - 91.415.0 %   Platelets 172 140 - 400 Thousand/uL   MPV 11.5 7.5 - 12.5 fL   Neutro Abs 11,534 (H) 1,500 - 7,800 cells/uL   Lymphs Abs 1,156 850 - 3,900 cells/uL   Absolute Monocytes 1,227 (H) 200 - 950 cells/uL   Eosinophils Absolute 127 15 - 500 cells/uL   Basophils Absolute 56 0 - 200 cells/uL   Neutrophils Relative % 81.8 %   Total Lymphocyte 8.2 %   Monocytes Relative 8.7 %   Eosinophils Relative 0.9 %   Basophils Relative 0.4 %  COMPLETE METABOLIC PANEL WITH GFR     Status: Abnormal   Collection Time: 03/07/19  9:09 AM  Result Value Ref Range   Glucose, Bld 104 (H) 65 - 99 mg/dL    Comment: .            Fasting reference interval . For someone without known diabetes, a glucose value between 100 and 125 mg/dL is consistent with prediabetes and should be confirmed with a follow-up test. .    BUN 22 7 - 25 mg/dL   Creat 7.820.89 9.560.70 - 2.131.33 mg/dL    Comment: For patients >54 years of age, the reference limit for Creatinine is approximately 13% higher for people identified as African-American. .    GFR, Est Non African American 97 > OR = 60 mL/min/1.873m2   GFR, Est African American 112 > OR = 60 mL/min/1.8573m2   BUN/Creatinine Ratio NOT APPLICABLE 6 - 22 (calc)   Sodium 137 135 - 146 mmol/L   Potassium 3.8 3.5 - 5.3 mmol/L   Chloride 100 98 - 110 mmol/L   CO2 28 20 - 32 mmol/L   Calcium 9.2 8.6 - 10.3 mg/dL   Total Protein 6.6 6.1 - 8.1 g/dL   Albumin 4.0 3.6 - 5.1 g/dL   Globulin 2.6 1.9 - 3.7 g/dL (calc)    AG Ratio 1.5 1.0 - 2.5 (calc)  Total Bilirubin 0.6 0.2 - 1.2 mg/dL   Alkaline phosphatase (APISO) 77 35 - 144 U/L   AST 21 10 - 35 U/L   ALT 19 9 - 46 U/L   No results found.  Procedure:  Incision and drainage of abscess. Risks, benefits, and alternatives explained and consent obtained. Time out conducted. Surface cleaned with betadine. 5 cc lidocaine with epinephine infiltrated around abscess. Pus expressed with pressure. Curved hemostat used to explore 4 quadrants and loculations broken up. Further purulence expressed. 24 inches of iodoform packing placed leaving a 1-inch tail. Hemostasis achieved. Patient tolerated procedure without immediate complications.   Assessment and Plan: 53 y.o. male with   .Dong was seen today for wound check.  Diagnoses and all orders for this visit:  Abscess of left axilla -     ketorolac (TORADOL) 30 MG/ML injection 30 mg  Cellulitis and abscess of trunk -     ketorolac (TORADOL) 30 MG/ML injection 30 mg    Vitals reviewed. Afebrile. No tachycardia. No tachypnea, BP normotensive On exam, cellulitis has improved since switching to Linezolid. Abscess was further drained and packed today (see procedure note above) Counseled patient to remove 2 inches of packing tomorrow Toradol for pain control Percocet prn for severe/nighttime pain. Counseled patient not to drive/operate machinery while taking this medicaiton Follow-up tomorrow for Toradol IM 30 mg  Patient education and anticipatory guidance given Patient agrees with treatment plan Follow-up in 2 days for wound check or sooner as needed if symptoms worsen or fail to improve  Darlyne Russian PA-C

## 2019-03-09 ENCOUNTER — Ambulatory Visit (INDEPENDENT_AMBULATORY_CARE_PROVIDER_SITE_OTHER): Payer: BC Managed Care – PPO | Admitting: Physician Assistant

## 2019-03-09 ENCOUNTER — Other Ambulatory Visit: Payer: Self-pay

## 2019-03-09 VITALS — BP 125/70 | HR 72 | Temp 98.1°F | Wt 190.0 lb

## 2019-03-09 DIAGNOSIS — L03319 Cellulitis of trunk, unspecified: Secondary | ICD-10-CM | POA: Diagnosis not present

## 2019-03-09 DIAGNOSIS — L02219 Cutaneous abscess of trunk, unspecified: Secondary | ICD-10-CM

## 2019-03-09 MED ORDER — KETOROLAC TROMETHAMINE 30 MG/ML IJ SOLN
30.0000 mg | Freq: Once | INTRAMUSCULAR | Status: AC
  Administered 2019-03-09: 30 mg via INTRAMUSCULAR

## 2019-03-09 NOTE — Progress Notes (Signed)
Established Patient Office Visit  Subjective:  Patient ID: Earl [name], male    DOB: 1966/01/21  Age: 53 y.o. MRN: [id number]  CC:  Chief Complaint  Patient presents with  . Wound Check    HPI Earl [name] presents for Toradol injection. He underwent I&D on 03/04/19 for abscess of left axilla. He reports his dressing is saturated with blood and pus.   Past Medical History:  Diagnosis Date  . Acute urinary retention   . Arthritis   . Depression   . Hemorrhoids   . History of skin cancer in adulthood 2012   chest wall  . Hypertension   . Hypogonadism, male   . Insomnia     Past Surgical History:  Procedure Laterality Date  . APPENDECTOMY    . COLONOSCOPY    . HERNIA REPAIR    . JOINT REPLACEMENT    . OSTEOTOMY    . SHOULDER ARTHROSCOPY Left 06/2018  . TOTAL HIP ARTHROPLASTY Right   . TOTAL KNEE ARTHROPLASTY Left     Family History  Problem Relation Age of Onset  . Stroke Mother   . Skin cancer Father   . Heart disease Father   . Hypertension Father   . Aneurysm Brother   . Hypertension Sister   . Heart disease Sister   . Cancer Neg Hx   . Heart attack Neg Hx     Social History   Socioeconomic History  . Marital status: Divorced    Spouse name: Not on file  . Number of children: Not on file  . Years of education: Not on file  . Highest education level: Not on file  Occupational History  . Not on file  Social Needs  . Financial resource strain: Not on file  . Food insecurity    Worry: Not on file    Inability: Not on file  . Transportation needs    Medical: Not on file    Non-medical: Not on file  Tobacco Use  . Smoking status: Never Smoker  . Smokeless tobacco: Never Used  Substance and Sexual Activity  . Alcohol use: Not Currently  . Drug use: Never  . Sexual activity: Yes    Birth control/protection: None  Lifestyle  . Physical activity    Days per week: Not on file    Minutes per session: Not on file  . Stress: Not on file   Relationships  . Social Herbalist on phone: Not on file    Gets together: Not on file    Attends religious service: Not on file    Active member of club or organization: Not on file    Attends meetings of clubs or organizations: Not on file    Relationship status: Not on file  . Intimate partner violence    Fear of current or ex partner: Not on file    Emotionally abused: Not on file    Physically abused: Not on file    Forced sexual activity: Not on file  Other Topics Concern  . Not on file  Social History Narrative  . Not on file    Outpatient Medications Prior to Visit  Medication Sig Dispense Refill  . eletriptan (RELPAX) 40 MG tablet Take 1 tablet (40 mg total) by mouth as needed for migraine or headache. May repeat 1 add'l dose in 2 hours if migraine persist/recurs. Max dose 80 mg/24hr 9 tablet 1  . Eszopiclone (ESZOPICLONE) 3 MG TABS Take 1 tablet (  3 mg total) by mouth at bedtime. 30 tablet 0  . ketorolac (TORADOL) 10 MG tablet Take 1 tablet (10 mg total) by mouth every 6 (six) hours as needed for moderate pain. 20 tablet 0  . linezolid (ZYVOX) 600 MG tablet Take 1 tablet (600 mg total) by mouth 2 (two) times daily for 10 days. 20 tablet 0  . Multiple Vitamins-Minerals (MULTIVITAL PO) Take 1 tablet by mouth daily.     Marland Kitchen. oxyCODONE-acetaminophen (PERCOCET/ROXICET) 5-325 MG tablet Take 1 tablet by mouth at bedtime as needed for up to 5 days for severe pain. 10 tablet 0  . testosterone cypionate (DEPOTESTOSTERONE CYPIONATE) 200 MG/ML injection Inject 200 mg into the muscle every 14 (fourteen) days.      No facility-administered medications prior to visit.     Allergies  Allergen Reactions  . Iodinated Diagnostic Agents Swelling    N/V - rash- red skin   . Morphine And Related Other (See Comments)    Irritability   . Temazepam Other (See Comments)    Mood change, irritability    ROS Review of Systems    Objective:    Physical Exam  BP 125/70   Pulse  72   Temp 98.1 F (36.7 C) (Oral)   Wt 190 lb (86.2 kg)   SpO2 98%   BMI 28.89 kg/m  Wt Readings from Last 3 Encounters:  03/09/19 190 lb (86.2 kg)  03/07/19 190 lb (86.2 kg)  03/04/19 186 lb (84.4 kg)     There are no preventive care reminders to display for this patient.  There are no preventive care reminders to display for this patient.  No results found for: TSH Lab Results  Component Value Date   WBC 14.1 (H) 03/07/2019   HGB 14.5 03/07/2019   HCT 41.0 03/07/2019   MCV 89.1 03/07/2019   PLT 172 03/07/2019   Lab Results  Component Value Date   NA 137 03/07/2019   K 3.8 03/07/2019   CO2 28 03/07/2019   GLUCOSE 104 (H) 03/07/2019   BUN 22 03/07/2019   CREATININE 0.89 03/07/2019   BILITOT 0.6 03/07/2019   ALKPHOS 70 12/08/2018   AST 21 03/07/2019   ALT 19 03/07/2019   PROT 6.6 03/07/2019   ALBUMIN 3.7 12/08/2018   CALCIUM 9.2 03/07/2019   ANIONGAP 7 12/08/2018   Lab Results  Component Value Date   CHOL 136 10/22/2018   Lab Results  Component Value Date   HDL 39 (L) 10/22/2018   Lab Results  Component Value Date   LDLCALC 78 10/22/2018   Lab Results  Component Value Date   TRIG 111 10/22/2018   Lab Results  Component Value Date   CHOLHDL 3.5 10/22/2018   No results found for: HGBA1C    Assessment & Plan:  Injection - Patient tolerated injection well without complications.   Wound - Changed dressing.   Problem List Items Addressed This Visit    Cellulitis and abscess of trunk - Primary      Meds ordered this encounter  Medications  . ketorolac (TORADOL) 30 MG/ML injection 30 mg    Follow-up: No follow-ups on file.    Esmond Harpsuttle, Arnisha Laffoon Hale, CMA

## 2019-03-10 ENCOUNTER — Ambulatory Visit (INDEPENDENT_AMBULATORY_CARE_PROVIDER_SITE_OTHER): Payer: BC Managed Care – PPO | Admitting: Physician Assistant

## 2019-03-10 ENCOUNTER — Encounter: Payer: Self-pay | Admitting: Physician Assistant

## 2019-03-10 VITALS — BP 134/79 | HR 70 | Temp 97.6°F | Wt 190.0 lb

## 2019-03-10 DIAGNOSIS — L02213 Cutaneous abscess of chest wall: Secondary | ICD-10-CM

## 2019-03-10 MED ORDER — KETOROLAC TROMETHAMINE 30 MG/ML IJ SOLN
30.0000 mg | Freq: Once | INTRAMUSCULAR | Status: AC
  Administered 2019-03-10: 30 mg via INTRAMUSCULAR

## 2019-03-13 ENCOUNTER — Encounter: Payer: Self-pay | Admitting: Physician Assistant

## 2019-03-13 NOTE — Progress Notes (Signed)
HPI:                                                                Earl [name] is a 53 y.o. male who [name] today for wound check  Patient underwent I&D on 03/04/19 for abscess of left axilla He has a history of MRSA cellulitis of nasal wall that required hospitalization (12/04/2018-12/08/18) and Zyvox  Interval hx 03/08/19 - He was switched to Linezolid yesterday and has had 3 doses - CBC showed leukocytosis with left shift (neutrophils 11,534) - wound cx was positive for MRSA that was sensitive to Clindamycin, however, patients cellulitis was worsening while taking Clindamycin, so he was switched to Linezolid - still having significant pain and purulent drainage from incision site. Reports IM Toradol provides the most significant pain relief - denies fever or malaise today  03/10/19 - He is on day 4 of Linezolid - abscess was drained further and re-packed on 03/08/19  - abscess continues to drain copious amounts of purulent material that soaks through his clothing/bedding - redness continues to improve - no fever/chills   Past Medical History:  Diagnosis Date  . Acute urinary retention   . Arthritis   . Depression   . Hemorrhoids   . History of skin cancer in adulthood 2012   chest wall  . Hypertension   . Hypogonadism, male   . Insomnia    Past Surgical History:  Procedure Laterality Date  . APPENDECTOMY    . COLONOSCOPY    . HERNIA REPAIR    . JOINT REPLACEMENT    . OSTEOTOMY    . SHOULDER ARTHROSCOPY Left 06/2018  . TOTAL HIP ARTHROPLASTY Right   . TOTAL KNEE ARTHROPLASTY Left    Social History   Tobacco Use  . Smoking status: Never Smoker  . Smokeless tobacco: Never Used  Substance Use Topics  . Alcohol use: Not Currently   family history includes Aneurysm in his brother; Heart disease in his father and sister; Hypertension in his father and sister; Skin cancer in his father; Stroke in his  mother.    ROS: negative except as noted in the HPI  Medications: Current Outpatient Medications  Medication Sig Dispense Refill  . eletriptan (RELPAX) 40 MG tablet Take 1 tablet (40 mg total) by mouth as needed for migraine or headache. May repeat 1 add'l dose in 2 hours if migraine persist/recurs. Max dose 80 mg/24hr 9 tablet 1  . Eszopiclone (ESZOPICLONE) 3 MG TABS Take 1 tablet (3 mg total) by mouth at bedtime. 30 tablet 0  . ketorolac (TORADOL) 10 MG tablet Take 1 tablet (10 mg total) by mouth every 6 (six) hours as needed for moderate pain. 20 tablet 0  . linezolid (ZYVOX) 600 MG tablet Take 1 tablet (600 mg total) by mouth 2 (two) times daily for 10 days. 20 tablet 0  . Multiple Vitamins-Minerals (MULTIVITAL PO) Take 1 tablet by mouth daily.     Marland Kitchen. testosterone cypionate (DEPOTESTOSTERONE CYPIONATE) 200 MG/ML injection Inject 200 mg into the muscle every 14 (fourteen) days.      No current facility-administered medications for this visit.    Allergies  Allergen Reactions  . Iodinated Diagnostic Agents Swelling    N/V -  rash- red skin   . Morphine And Related Other (See Comments)    Irritability   . Temazepam Other (See Comments)    Mood change, irritability       Objective:  BP 134/79   Pulse 70   Temp 97.6 F (36.4 C) (Oral)   Wt 190 lb (86.2 kg)   BMI 28.89 kg/m  Physical Exam Skin:    Findings: Abscess and erythema present.           Recent Results (from the past 2160 hour(s))  CBC with Differential/Platelet     Status: Abnormal   Collection Time: 12/28/18 11:24 AM  Result Value Ref Range   WBC 8.5 3.8 - 10.8 Thousand/uL   RBC 5.57 4.20 - 5.80 Million/uL   Hemoglobin 17.3 (H) 13.2 - 17.1 g/dL   HCT 16.149.4 09.638.5 - 04.550.0 %   MCV 88.7 80.0 - 100.0 fL   MCH 31.1 27.0 - 33.0 pg   MCHC 35.0 32.0 - 36.0 g/dL   RDW 40.913.0 81.111.0 - 91.415.0 %   Platelets 168 140 - 400 Thousand/uL   MPV 11.6 7.5 - 12.5 fL   Neutro Abs 5,721 1,500 - 7,800 cells/uL   Lymphs Abs 1,836  850 - 3,900 cells/uL   Absolute Monocytes 808 200 - 950 cells/uL   Eosinophils Absolute 102 15 - 500 cells/uL   Basophils Absolute 34 0 - 200 cells/uL   Neutrophils Relative % 67.3 %   Total Lymphocyte 21.6 %   Monocytes Relative 9.5 %   Eosinophils Relative 1.2 %   Basophils Relative 0.4 %  PTH, Intact and Calcium     Status: Abnormal   Collection Time: 02/14/19  4:48 PM  Result Value Ref Range   PTH 13 (L) 14 - 64 pg/mL    Comment: . Interpretive Guide    Intact PTH           Calcium ------------------    ----------           ------- Normal Parathyroid    Normal               Normal Hypoparathyroidism    Low or Low Normal    Low Hyperparathyroidism    Primary            Normal or High       High    Secondary          High                 Normal or Low    Tertiary           High                 High Non-Parathyroid    Hypercalcemia      Low or Low Normal    High .    Calcium 10.1 8.6 - 10.3 mg/dL  Magnesium     Status: None   Collection Time: 02/14/19  4:48 PM  Result Value Ref Range   Magnesium 1.9 1.5 - 2.5 mg/dL  Phosphorus     Status: None   Collection Time: 02/14/19  4:48 PM  Result Value Ref Range   Phosphorus 3.4 2.5 - 4.5 mg/dL  VITAMIN D 25 Hydroxy (Vit-D Deficiency, Fractures)     Status: None   Collection Time: 02/14/19  4:48 PM  Result Value Ref Range   Vit D, 25-Hydroxy 65 30 - 100 ng/mL    Comment: Vitamin D Status  25-OH Vitamin D: . Deficiency:                    <20 ng/mL Insufficiency:             20 - 29 ng/mL Optimal:                 > or = 30 ng/mL . For 25-OH Vitamin D testing on patients on  D2-supplementation and patients for whom quantitation  of D2 and D3 fractions is required, the [facility](TM) 25-OH VIT D, (D2,D3), LC/MS/MS is recommended: order  code [id number] (patients >6082yrs). See Note 1 . Note 1 . For additional information, please refer to  http://education.[facility].com/faq/FAQ199  (This link is being provided for  informational/ educational purposes only.)   Wound culture     Status: Abnormal   Collection Time: 03/04/19 11:38 AM   Specimen: Wound  Result Value Ref Range   MICRO NUMBER: [id number]    SPECIMEN QUALITY: Adequate    SOURCE: NOT GIVEN    STATUS: FINAL    GRAM STAIN:      No white blood cells seen Rare epithelial cells Few Gram positive cocci in pairs   ISOLATE 1: methicillin resistant Staphylococcus aureus (A)     Comment: Heavy growth of Methicillin resistant Staphylococcus aureus (MRSA) Negative for inducible clindamycin resistance.      Susceptibility   Methicillin resistant staphylococcus aureus - AEROBIC CULT, GRAM STAIN POSITIVE 1    VANCOMYCIN 1 Sensitive     CIPROFLOXACIN >=8 Resistant     CLINDAMYCIN <=0.25 Sensitive     LEVOFLOXACIN 4 Resistant     ERYTHROMYCIN >=8 Resistant     GENTAMICIN <=0.5 Sensitive     OXACILLIN* NR Resistant      * Oxacillin-resistant staphylococci are resistant toall currently available beta-lactam antimicrobialagents including penicillins, beta lactam/beta-lactamase inhibitor combinations, and cephems withstaphylococcal indications, including Cefazolin.    TETRACYCLINE >=16 Resistant     TRIMETH/SULFA* >=320 Resistant      * Oxacillin-resistant staphylococci are resistant toall currently available beta-lactam antimicrobialagents including penicillins, beta lactam/beta-lactamase inhibitor combinations, and cephems withstaphylococcal indications, including Cefazolin.Legend:S = Susceptible  I = IntermediateR = Resistant  NS = Not susceptible* = Not tested  NR = Not reported**NN = See antimicrobic comments  CBC with Differential/Platelet     Status: Abnormal   Collection Time: 03/07/19  9:09 AM  Result Value Ref Range   WBC 14.1 (H) 3.8 - 10.8 Thousand/uL   RBC 4.60 4.20 - 5.80 Million/uL   Hemoglobin 14.5 13.2 - 17.1 g/dL   HCT 47.841.0 29.538.5 - 62.150.0 %   MCV 89.1 80.0 - 100.0 fL   MCH 31.5 27.0 - 33.0 pg   MCHC 35.4 32.0 - 36.0 g/dL   RDW 30.812.0 65.711.0 -  84.615.0 %   Platelets 172 140 - 400 Thousand/uL   MPV 11.5 7.5 - 12.5 fL   Neutro Abs 11,534 (H) 1,500 - 7,800 cells/uL   Lymphs Abs 1,156 850 - 3,900 cells/uL   Absolute Monocytes 1,227 (H) 200 - 950 cells/uL   Eosinophils Absolute 127 15 - 500 cells/uL   Basophils Absolute 56 0 - 200 cells/uL   Neutrophils Relative % 81.8 %   Total Lymphocyte 8.2 %   Monocytes Relative 8.7 %   Eosinophils Relative 0.9 %   Basophils Relative 0.4 %  COMPLETE METABOLIC PANEL WITH GFR     Status: Abnormal   Collection Time: 03/07/19  9:09 AM  Result Value Ref Range   Glucose,  Bld 104 (H) 65 - 99 mg/dL    Comment: .            Fasting reference interval . For someone without known diabetes, a glucose value between 100 and 125 mg/dL is consistent with prediabetes and should be confirmed with a follow-up test. .    BUN 22 7 - 25 mg/dL   Creat 0.89 0.70 - 1.33 mg/dL    Comment: For patients >69 years of age, the reference limit for Creatinine is approximately 13% higher for people identified as African-American. .    GFR, Est Non African American 97 > OR = 60 mL/min/1.109m2   GFR, Est African American 112 > OR = 60 mL/min/1.73m2   BUN/Creatinine Ratio NOT APPLICABLE 6 - 22 (calc)   Sodium 137 135 - 146 mmol/L   Potassium 3.8 3.5 - 5.3 mmol/L   Chloride 100 98 - 110 mmol/L   CO2 28 20 - 32 mmol/L   Calcium 9.2 8.6 - 10.3 mg/dL   Total Protein 6.6 6.1 - 8.1 g/dL   Albumin 4.0 3.6 - 5.1 g/dL   Globulin 2.6 1.9 - 3.7 g/dL (calc)   AG Ratio 1.5 1.0 - 2.5 (calc)   Total Bilirubin 0.6 0.2 - 1.2 mg/dL   Alkaline phosphatase (APISO) 77 35 - 144 U/L   AST 21 10 - 35 U/L   ALT 19 9 - 46 U/L    Assessment and Plan: 53 y.o. male with   .Shaheen was seen today for wound check.  Diagnoses and all orders for this visit:  Abscess of left axilla -     ketorolac (TORADOL) 30 MG/ML injection 30 mg  On exam, he is afebrile and well-appearing. Abscess is still actively draining, surrounding cellulitis  continues to improve and area of erythema is significantly smaller today Approx 6 inches of packing removed today Patient instructed to remove 2 inches daily Continue Linezolid May use oral Toradol for max of 5 days Tylenol [phone] mg Q8H prn for pain Continue Percocet for severe pain   Patient education and anticipatory guidance given Patient agrees with treatment plan Follow-up in 4 days for wound check as needed if symptoms worsen or fail to improve  Darlyne Russian PA-C

## 2019-03-14 ENCOUNTER — Other Ambulatory Visit: Payer: Self-pay

## 2019-03-14 ENCOUNTER — Encounter: Payer: Self-pay | Admitting: Sports Medicine

## 2019-03-14 ENCOUNTER — Ambulatory Visit (INDEPENDENT_AMBULATORY_CARE_PROVIDER_SITE_OTHER): Payer: BC Managed Care – PPO | Admitting: Sports Medicine

## 2019-03-14 DIAGNOSIS — L03319 Cellulitis of trunk, unspecified: Secondary | ICD-10-CM | POA: Diagnosis not present

## 2019-03-14 DIAGNOSIS — F5104 Psychophysiologic insomnia: Secondary | ICD-10-CM

## 2019-03-14 DIAGNOSIS — L02219 Cutaneous abscess of trunk, unspecified: Secondary | ICD-10-CM

## 2019-03-14 DIAGNOSIS — L02412 Cutaneous abscess of left axilla: Secondary | ICD-10-CM

## 2019-03-14 MED ORDER — OXYCODONE-ACETAMINOPHEN 5-325 MG PO TABS: 1.0000 | ORAL_TABLET | Freq: Every evening | ORAL | 0 refills | Status: AC | PRN

## 2019-03-14 MED ORDER — ESZOPICLONE 3 MG PO TABS: 3.0000 mg | ORAL_TABLET | Freq: Every day | ORAL | 0 refills | Status: DC

## 2019-03-14 MED ORDER — LINEZOLID 600 MG PO TABS: 600.0000 mg | ORAL_TABLET | Freq: Two times a day (BID) | ORAL | 0 refills | Status: DC

## 2019-03-14 NOTE — Assessment & Plan Note (Signed)
Continues to do well after incision and drainage, erythema is gone, he still has a bit of swelling, there are some hard, tender lymphadenopathy. I removed all of the packing today, dressed with gauze. I am going to extend his course of linezolid for 1 more week. He did grow out MRSA resistant to multiple antibiotics. Refilling his pain medication as well, return to see [place] in 1 week.

## 2019-03-14 NOTE — Progress Notes (Signed)
Subjective:    CC: Follow-up abscess  HPI: Earl CobbKent had an incision and drainage of a large left axillary abscess 10 days ago, we were able to express a great deal of purulence, he has been packed, he did grow out MRSA, or ultimately was placed on Zyvox.  Continues to improve.  Still has a bit of pain, no fevers, chills, no more purulence, mostly just blood leaking out.  He does have a bit of loose stools but has been doing a great deal of probiotics.  He does have a history of C. difficile diarrhea.  I reviewed the past medical history, family history, social history, surgical history, and allergies today and no changes were needed.  Please see the problem list section below in epic for further details.  Past Medical History: Past Medical History:  Diagnosis Date  . Acute urinary retention   . Arthritis   . Depression   . Hemorrhoids   . History of skin cancer in adulthood 2012   chest wall  . Hypertension   . Hypogonadism, male   . Insomnia    Past Surgical History: Past Surgical History:  Procedure Laterality Date  . APPENDECTOMY    . COLONOSCOPY    . HERNIA REPAIR    . JOINT REPLACEMENT    . OSTEOTOMY    . SHOULDER ARTHROSCOPY Left 06/2018  . TOTAL HIP ARTHROPLASTY Right   . TOTAL KNEE ARTHROPLASTY Left    Social History: Social History   Socioeconomic History  . Marital status: Divorced    Spouse name: Not on file  . Number of children: Not on file  . Years of education: Not on file  . Highest education level: Not on file  Occupational History  . Not on file  Social Needs  . Financial resource strain: Not on file  . Food insecurity    Worry: Not on file    Inability: Not on file  . Transportation needs    Medical: Not on file    Non-medical: Not on file  Tobacco Use  . Smoking status: Never Smoker  . Smokeless tobacco: Never Used  Substance and Sexual Activity  . Alcohol use: Not Currently  . Drug use: Never  . Sexual activity: Yes    Birth  control/protection: None  Lifestyle  . Physical activity    Days per week: Not on file    Minutes per session: Not on file  . Stress: Not on file  Relationships  . Social Musicianconnections    Talks on phone: Not on file    Gets together: Not on file    Attends religious service: Not on file    Active member of club or organization: Not on file    Attends meetings of clubs or organizations: Not on file    Relationship status: Not on file  Other Topics Concern  . Not on file  Social History Narrative  . Not on file   Family History: Family History  Problem Relation Age of Onset  . Stroke Mother   . Skin cancer Father   . Heart disease Father   . Hypertension Father   . Aneurysm Brother   . Hypertension Sister   . Heart disease Sister   . Cancer Neg Hx   . Heart attack Neg Hx    Allergies: Allergies  Allergen Reactions  . Iodinated Diagnostic Agents Swelling    N/V - rash- red skin   . Morphine And Related Other (See Comments)    Irritability   .  Temazepam Other (See Comments)    Mood change, irritability   Medications: See med rec.  Review of Systems: No fevers, chills, night sweats, weight loss, chest pain, or shortness of breath.   Objective:    General: Well Developed, well nourished, and in no acute distress.  Neuro: Alert and oriented x3, extra-ocular muscles intact, sensation grossly intact.  HEENT: Normocephalic, atraumatic, pupils equal round reactive to light, neck supple, no masses, no lymphadenopathy, thyroid nonpalpable.  Skin: Warm and dry, no rashes. Cardiac: Regular rate and rhythm, no murmurs rubs or gallops, no lower extremity edema.  Respiratory: Clear to auscultation bilaterally. Not using accessory muscles, speaking in full sentences. Left axilla: Erythema has resolved, he does have a few palpable tender lymph nodes, I removed all of the packing, there is only bloody drainage.  This was dressed with an antibiotic ointment and gauze as well as cloth  tape.  Impression and Recommendations:    Abscess of left axilla Continues to do well after incision and drainage, erythema is gone, he still has a bit of swelling, there are some hard, tender lymphadenopathy. I removed all of the packing today, dressed with gauze. I am going to extend his course of linezolid for 1 more week. He did grow out MRSA resistant to multiple antibiotics. Refilling his pain medication as well, return to see [place] in 1 week.   ___________________________________________ Gwen Her. Dianah Field, M.D., ABFM., CAQSM. Primary Care and Sports Medicine [facility]  Adjunct Professor of [employer] [facility] [employer] [facility] [employer] Medicine

## 2019-03-15 NOTE — Telephone Encounter (Signed)
To PCP, she will be back when he is up for refills.

## 2019-03-16 NOTE — Telephone Encounter (Signed)
Dr T refilled 03/14/19 #30 Defer additional refills to PCP PDMP reviewed during this encounter.

## 2019-03-19 ENCOUNTER — Other Ambulatory Visit: Payer: Self-pay | Admitting: Sports Medicine

## 2019-03-19 DIAGNOSIS — S46012A Strain of muscle(s) and tendon(s) of the rotator cuff of left shoulder, initial encounter: Secondary | ICD-10-CM

## 2019-03-21 ENCOUNTER — Other Ambulatory Visit: Payer: Self-pay

## 2019-03-21 ENCOUNTER — Encounter: Payer: Self-pay | Admitting: Physician Assistant

## 2019-03-21 ENCOUNTER — Ambulatory Visit (INDEPENDENT_AMBULATORY_CARE_PROVIDER_SITE_OTHER): Payer: BC Managed Care – PPO | Admitting: Physician Assistant

## 2019-03-21 VITALS — BP 138/76 | HR 68 | Temp 97.9°F | Wt 187.0 lb

## 2019-03-21 DIAGNOSIS — L03319 Cellulitis of trunk, unspecified: Secondary | ICD-10-CM

## 2019-03-21 DIAGNOSIS — Z22322 Carrier or suspected carrier of Methicillin resistant Staphylococcus aureus: Secondary | ICD-10-CM

## 2019-03-21 DIAGNOSIS — Z5181 Encounter for therapeutic drug level monitoring: Secondary | ICD-10-CM | POA: Diagnosis not present

## 2019-03-21 DIAGNOSIS — L02219 Cutaneous abscess of trunk, unspecified: Secondary | ICD-10-CM

## 2019-03-21 DIAGNOSIS — L02412 Cutaneous abscess of left axilla: Secondary | ICD-10-CM

## 2019-03-21 DIAGNOSIS — D72825 Bandemia: Secondary | ICD-10-CM

## 2019-03-21 LAB — CBC WITH DIFFERENTIAL/PLATELET
Absolute Monocytes: 502 cells/uL (ref 200–950)
Basophils Absolute: 40 cells/uL (ref 0–200)
Basophils Relative: 0.7 %
Eosinophils Absolute: 120 cells/uL (ref 15–500)
Eosinophils Relative: 2.1 %
HCT: 49.1 % (ref 38.5–50.0)
Hemoglobin: 16.6 g/dL (ref 13.2–17.1)
Lymphs Abs: 1630 cells/uL (ref 850–3900)
MCH: 31 pg (ref 27.0–33.0)
MCHC: 33.8 g/dL (ref 32.0–36.0)
MCV: 91.6 fL (ref 80.0–100.0)
MPV: 10.7 fL (ref 7.5–12.5)
Monocytes Relative: 8.8 %
Neutro Abs: 3409 cells/uL (ref 1500–7800)
Neutrophils Relative %: 59.8 %
Platelets: 193 10*3/uL (ref 140–400)
RBC: 5.36 10*6/uL (ref 4.20–5.80)
RDW: 12.8 % (ref 11.0–15.0)
Total Lymphocyte: 28.6 %
WBC: 5.7 10*3/uL (ref 3.8–10.8)

## 2019-03-21 MED ORDER — CLINDAMYCIN HCL 150 MG PO CAPS: 450.0000 mg | ORAL_CAPSULE | Freq: Three times a day (TID) | ORAL | 0 refills | Status: AC

## 2019-03-21 MED ORDER — CHLORHEXIDINE GLUCONATE 0.12% ORAL RINSE (MEDLINE KIT): OROMUCOSAL | 1 refills | Status: DC

## 2019-03-21 MED ORDER — MUPIROCIN 2 % EX OINT: TOPICAL_OINTMENT | CUTANEOUS | 1 refills | Status: DC

## 2019-03-21 MED ORDER — CHLORHEXIDINE GLUCONATE 4 % EX LIQD: CUTANEOUS | 1 refills | Status: DC

## 2019-03-21 NOTE — Progress Notes (Signed)
HPI:                                                                Earl [name] is a 53 y.o. male who presents to [facility]: Primary Care Sports Medicine today for wound check  Patient underwent I&D on 03/04/19 for abscess of left axilla He has a history of MRSA cellulitis of nasal wall that required hospitalization (12/04/2018-12/08/18) and Zyvox  03/08/19 - He was switched to Linezolid yesterday and has had 3 doses - CBC showed leukocytosis with left shift (neutrophils 11,534) - wound cx was positive for MRSA that was sensitive to Clindamycin, however, patients cellulitis was worsening while taking Clindamycin, so he was switched to Linezolid - still having significant pain and purulent drainage from incision site. Reports IM Toradol provides the most significant pain relief - denies fever or malaise today  03/10/19 - He is on day 4 of Linezolid - abscess was drained further and re-packed on 03/08/19  - abscess continues to drain copious amounts of purulent material that soaks through his clothing/bedding - redness continues to improve - no fever/chills  03/21/19 - packing was removed by Dr. Dianah Field last week. He was continued on Linezolid for an additional week. Has been having loose stools related to antibiotic use - denies any drainage, tenderness, warmth or redness - reports incision site is still open and has not healed  Past Medical History:  Diagnosis Date  . Abscess of nasal cavity 12/14/2018  . Acute urinary retention   . Arthritis   . Cellulitis of sidewall of nose 12/02/2018  . Depression   . Hemorrhoids   . History of skin cancer in adulthood 2012   chest wall  . Hypertension   . Hypogonadism, male   . Insomnia    Past Surgical History:  Procedure Laterality Date  . APPENDECTOMY    . COLONOSCOPY    . HERNIA REPAIR    . JOINT REPLACEMENT    . OSTEOTOMY    . SHOULDER ARTHROSCOPY Left 06/2018  . TOTAL HIP ARTHROPLASTY Right   .  TOTAL KNEE ARTHROPLASTY Left    Social History   Tobacco Use  . Smoking status: Never Smoker  . Smokeless tobacco: Never Used  Substance Use Topics  . Alcohol use: Not Currently   family history includes Aneurysm in his brother; Heart disease in his father and sister; Hypertension in his father and sister; Skin cancer in his father; Stroke in his mother.    ROS: negative except as noted in the HPI  Medications: Current Outpatient Medications  Medication Sig Dispense Refill  . eletriptan (RELPAX) 40 MG tablet Take 1 tablet (40 mg total) by mouth as needed for migraine or headache. May repeat 1 add'l dose in 2 hours if migraine persist/recurs. Max dose 80 mg/24hr 9 tablet 1  . Eszopiclone (ESZOPICLONE) 3 MG TABS Take 1 tablet (3 mg total) by mouth at bedtime. 30 tablet 0  . ibuprofen (ADVIL) 800 MG tablet TAKE 1 TABLET BY MOUTH EVERY 8 HOURS AS NEEDED 90 tablet 0  . Multiple Vitamins-Minerals (MULTIVITAL PO) Take 1 tablet by mouth daily.     Marland Kitchen testosterone cypionate (DEPOTESTOSTERONE CYPIONATE) 200 MG/ML injection Inject 200 mg into the muscle every 14 (fourteen) days.     Marland Kitchen  chlorhexidine (HIBICLENS) 4 % external liquid Bathe once daily x 5 days twice per month for 6 mos for MRSA de-colonization 236 mL 1  . chlorhexidine gluconate, MEDLINE KIT, (PERIDEX) 0.12 % solution Swish, gargle and spit twice daily for 5 days twice per month x 6 mos for MRSA de-colonization 473 mL 1  . clindamycin (CLEOCIN) 150 MG capsule Take 3 capsules (450 mg total) by mouth 3 (three) times daily for 4 days. 63 capsule 0  . mupirocin ointment (BACTROBAN) 2 % Apply intranasally twice a day x 5 days, twice monthly x 6 mos for MRSA de-colonization 22 g 1   No current facility-administered medications for this visit.    Allergies  Allergen Reactions  . Iodinated Diagnostic Agents Swelling    N/V - rash- red skin   . Morphine And Related Other (See Comments)    Irritability   . Temazepam Other (See Comments)     Mood change, irritability       Objective:  BP 138/76   Pulse 68   Temp 97.9 F (36.6 C) (Oral)   Wt 187 lb (84.8 kg)   BMI 28.43 kg/m  Physical Exam Skin:    General: Skin is warm and dry.     Findings: Wound (I&D site) present. No abscess or erythema.           Recent Results (from the past 2160 hour(s))  CBC with Differential/Platelet     Status: Abnormal   Collection Time: 12/28/18 11:24 AM  Result Value Ref Range   WBC 8.5 3.8 - 10.8 Thousand/uL   RBC 5.57 4.20 - 5.80 Million/uL   Hemoglobin 17.3 (H) 13.2 - 17.1 g/dL   HCT 49.4 38.5 - 50.0 %   MCV 88.7 80.0 - 100.0 fL   MCH 31.1 27.0 - 33.0 pg   MCHC 35.0 32.0 - 36.0 g/dL   RDW 13.0 11.0 - 15.0 %   Platelets 168 140 - 400 Thousand/uL   MPV 11.6 7.5 - 12.5 fL   Neutro Abs 5,721 1,500 - 7,800 cells/uL   Lymphs Abs 1,836 850 - 3,900 cells/uL   Absolute Monocytes 808 200 - 950 cells/uL   Eosinophils Absolute 102 15 - 500 cells/uL   Basophils Absolute 34 0 - 200 cells/uL   Neutrophils Relative % 67.3 %   Total Lymphocyte 21.6 %   Monocytes Relative 9.5 %   Eosinophils Relative 1.2 %   Basophils Relative 0.4 %  PTH, Intact and Calcium     Status: Abnormal   Collection Time: 02/14/19  4:48 PM  Result Value Ref Range   PTH 13 (L) 14 - 64 pg/mL    Comment: . Interpretive Guide    Intact PTH           Calcium ------------------    ----------           ------- Normal Parathyroid    Normal               Normal Hypoparathyroidism    Low or Low Normal    Low Hyperparathyroidism    Primary            Normal or High       High    Secondary          High                 Normal or Low    Tertiary           High  High Non-Parathyroid    Hypercalcemia      Low or Low Normal    High .    Calcium 10.1 8.6 - 10.3 mg/dL  Magnesium     Status: None   Collection Time: 02/14/19  4:48 PM  Result Value Ref Range   Magnesium 1.9 1.5 - 2.5 mg/dL  Phosphorus     Status: None   Collection Time: 02/14/19   4:48 PM  Result Value Ref Range   Phosphorus 3.4 2.5 - 4.5 mg/dL  VITAMIN D 25 Hydroxy (Vit-D Deficiency, Fractures)     Status: None   Collection Time: 02/14/19  4:48 PM  Result Value Ref Range   Vit D, 25-Hydroxy 65 30 - 100 ng/mL    Comment: Vitamin D Status         25-OH Vitamin D: . Deficiency:                    <20 ng/mL Insufficiency:             20 - 29 ng/mL Optimal:                 > or = 30 ng/mL . For 25-OH Vitamin D testing on patients on  D2-supplementation and patients for whom quantitation  of D2 and D3 fractions is required, the [facility](TM) 25-OH VIT D, (D2,D3), LC/MS/MS is recommended: order  code [phone] (patients >15yr). See Note 1 . Note 1 . For additional information, please refer to  http://education.[facility].com/faq/FAQ199  (This link is being provided for informational/ educational purposes only.)   Wound culture     Status: Abnormal   Collection Time: 03/04/19 11:38 AM   Specimen: Wound  Result Value Ref Range   MICRO NUMBER: [id number]   SPECIMEN QUALITY: Adequate    SOURCE: NOT GIVEN    STATUS: FINAL    GRAM STAIN:      No white blood cells seen Rare epithelial cells Few Gram positive cocci in pairs   ISOLATE 1: methicillin resistant Staphylococcus aureus (A)     Comment: Heavy growth of Methicillin resistant Staphylococcus aureus (MRSA) Negative for inducible clindamycin resistance.      Susceptibility   Methicillin resistant staphylococcus aureus - AEROBIC CULT, GRAM STAIN POSITIVE 1    VANCOMYCIN 1 Sensitive     CIPROFLOXACIN >=8 Resistant     CLINDAMYCIN <=0.25 Sensitive     LEVOFLOXACIN 4 Resistant     ERYTHROMYCIN >=8 Resistant     GENTAMICIN <=0.5 Sensitive     OXACILLIN* NR Resistant      * Oxacillin-resistant staphylococci are resistant toall currently available beta-lactam antimicrobialagents including penicillins, beta lactam/beta-lactamase inhibitor combinations, and cephems withstaphylococcal indications, including  Cefazolin.    TETRACYCLINE >=16 Resistant     TRIMETH/SULFA* >=320 Resistant      * Oxacillin-resistant staphylococci are resistant toall currently available beta-lactam antimicrobialagents including penicillins, beta lactam/beta-lactamase inhibitor combinations, and cephems withstaphylococcal indications, including Cefazolin.Legend:S = Susceptible  I = IntermediateR = Resistant  NS = Not susceptible* = Not tested  NR = Not reported**NN = See antimicrobic comments  CBC with Differential/Platelet     Status: Abnormal   Collection Time: 03/07/19  9:09 AM  Result Value Ref Range   WBC 14.1 (H) 3.8 - 10.8 Thousand/uL   RBC 4.60 4.20 - 5.80 Million/uL   Hemoglobin 14.5 13.2 - 17.1 g/dL   HCT 41.0 38.5 - 50.0 %   MCV 89.1 80.0 - 100.0 fL   MCH 31.5 27.0 - 33.0  pg   MCHC 35.4 32.0 - 36.0 g/dL   RDW 12.0 11.0 - 15.0 %   Platelets 172 140 - 400 Thousand/uL   MPV 11.5 7.5 - 12.5 fL   Neutro Abs 11,534 (H) 1,500 - 7,800 cells/uL   Lymphs Abs 1,156 850 - 3,900 cells/uL   Absolute Monocytes 1,227 (H) 200 - 950 cells/uL   Eosinophils Absolute 127 15 - 500 cells/uL   Basophils Absolute 56 0 - 200 cells/uL   Neutrophils Relative % 81.8 %   Total Lymphocyte 8.2 %   Monocytes Relative 8.7 %   Eosinophils Relative 0.9 %   Basophils Relative 0.4 %  COMPLETE METABOLIC PANEL WITH GFR     Status: Abnormal   Collection Time: 03/07/19  9:09 AM  Result Value Ref Range   Glucose, Bld 104 (H) 65 - 99 mg/dL    Comment: .            Fasting reference interval . For someone without known diabetes, a glucose value between 100 and 125 mg/dL is consistent with prediabetes and should be confirmed with a follow-up test. .    BUN 22 7 - 25 mg/dL   Creat 0.89 0.70 - 1.33 mg/dL    Comment: For patients >7 years of age, the reference limit for Creatinine is approximately 13% higher for people identified as African-American. .    GFR, Est Non African American 97 > OR = 60 mL/min/1.38m   GFR, Est African  American 112 > OR = 60 mL/min/1.749m  BUN/Creatinine Ratio NOT APPLICABLE 6 - 22 (calc)   Sodium 137 135 - 146 mmol/L   Potassium 3.8 3.5 - 5.3 mmol/L   Chloride 100 98 - 110 mmol/L   CO2 28 20 - 32 mmol/L   Calcium 9.2 8.6 - 10.3 mg/dL   Total Protein 6.6 6.1 - 8.1 g/dL   Albumin 4.0 3.6 - 5.1 g/dL   Globulin 2.6 1.9 - 3.7 g/dL (calc)   AG Ratio 1.5 1.0 - 2.5 (calc)   Total Bilirubin 0.6 0.2 - 1.2 mg/dL   Alkaline phosphatase (APISO) 77 35 - 144 U/L   AST 21 10 - 35 U/L   ALT 19 9 - 46 U/L    Assessment and Plan: [age].o. male with   .KeRashawnas seen today for wound check.  Diagnoses and all orders for this visit:  Medication monitoring encounter -     CBC with Differential/Platelet  Bandemia -     CBC with Differential/Platelet  MRSA (methicillin resistant staph aureus) culture positive -     CBC with Differential/Platelet -     chlorhexidine gluconate, MEDLINE KIT, (PERIDEX) 0.12 % solution; Swish, gargle and spit twice daily for 5 days twice per month x 6 mos for MRSA de-colonization -     mupirocin ointment (BACTROBAN) 2 %; Apply intranasally twice a day x 5 days, twice monthly x 6 mos for MRSA de-colonization -     chlorhexidine (HIBICLENS) 4 % external liquid; Bathe once daily x 5 days twice per month for 6 mos for MRSA de-colonization  Abscess of left axilla -     clindamycin (CLEOCIN) 150 MG capsule; Take 3 capsules (450 mg total) by mouth 3 (three) times daily for 4 days.  Cellulitis and abscess of trunk  On exam, he is afebrile and well-appearing. There is no evidence of cellulitis or abscess recurrence. Incision site is still open and skin is mildly indurated without any tenderness or erythema De-escalate antibiotics from  Linezolid to Clindamycin Recheck CBC w/diff today to monitor Linezolid MRSA de-colonization x 5 days, twice monthly for 6 months  Patient education and anticipatory guidance given Patient agrees with treatment plan Follow-up in 1 week for  wound check as needed if symptoms worsen or fail to improve  Charley E. Cummings PA-C  

## 2019-03-21 NOTE — Patient Instructions (Addendum)
MRSA De-colonization  The best-studied regimen consists of 4% rinse-off chlorhexidine for daily bathing or showering, 0.12% chlorhexidine mouthwash twice daily, and 2% nasal mupirocin twice daily, all administered for five days twice per month for six months  Abscess (Incision & Drainage) An abscess is sometimes called a boil. It happens when bacteria get trapped under the skin and start to grow. Pus forms inside the abscess as the body responds to the bacteria. An abscess can happen with an insect bite, ingrown hair, blocked oil gland, pimple, cyst, or puncture wound.  Your healthcare provider has drained the pus from your abscess. If the abscess pocket was large, your healthcare provider may have put in gauze packing. Your provider will need to remove or replace it on your next visit. . You may not need antibiotics to treat a simple abscess, unless the infection is spreading into the skin around the wound (cellulitis).  The wound will take about 1 to 2 weeks to heal, depending on the size of the abscess. Healthy tissue will grow from the bottom and sides of the opening until it seals over.  Home care These tips can help your wound heal:  The wound may drain for the first 2 days. Cover the wound with a clean dry dressing. Change the dressing if it becomes soaked with blood or pus.  If a gauze packing was placed inside the abscess pocket, you may be told to remove it yourself. You may do this in the shower. Once the packing is removed, you should wash the area in the shower, or clean the area as directed by your provider. Continue to do this until the skin opening has closed. Make sure you wash your hands after changing the packing or cleaning the wound.  If you were prescribed antibiotics, take them as directed until they are all gone.  You may use acetaminophen or ibuprofen to control pain, unless another pain medicine was prescribed. If you have liver disease or ever had a stomach ulcer, talk  with your healthcare provider before using these medicines.  Follow-up care Follow up with your healthcare provider, or as advised. If a gauze packing was put in your wound, it should be removed in 1 to 2 days. Check your wound every day for any signs that the infection is getting worse. The signs are listed below.  When to seek medical advice Call your healthcare provider right away if any of these occur:  Increasing redness or swelling  Red streaks in the skin leading away from the wound  Increasing local pain or swelling  Continued pus draining from the wound 2 days after treatment  Fever of 100.41F (38C) or higher, or as directed by your healthcare provider  Boil returns when you are at home

## 2019-03-24 DIAGNOSIS — T8484XA Pain due to internal orthopedic prosthetic devices, implants and grafts, initial encounter: Secondary | ICD-10-CM | POA: Diagnosis not present

## 2019-03-24 DIAGNOSIS — Z471 Aftercare following joint replacement surgery: Secondary | ICD-10-CM | POA: Diagnosis not present

## 2019-03-24 DIAGNOSIS — Z96652 Presence of left artificial knee joint: Secondary | ICD-10-CM | POA: Diagnosis not present

## 2019-03-24 DIAGNOSIS — T8484XD Pain due to internal orthopedic prosthetic devices, implants and grafts, subsequent encounter: Secondary | ICD-10-CM | POA: Diagnosis not present

## 2019-03-24 DIAGNOSIS — Z96641 Presence of right artificial hip joint: Secondary | ICD-10-CM | POA: Diagnosis not present

## 2019-03-28 ENCOUNTER — Other Ambulatory Visit: Payer: Self-pay

## 2019-03-28 ENCOUNTER — Ambulatory Visit (INDEPENDENT_AMBULATORY_CARE_PROVIDER_SITE_OTHER): Payer: BC Managed Care – PPO | Admitting: Physician Assistant

## 2019-03-28 ENCOUNTER — Encounter: Payer: Self-pay | Admitting: Physician Assistant

## 2019-03-28 VITALS — BP 135/83 | HR 68 | Temp 97.7°F | Wt 186.0 lb

## 2019-03-28 DIAGNOSIS — F5104 Psychophysiologic insomnia: Secondary | ICD-10-CM

## 2019-03-28 DIAGNOSIS — L02219 Cutaneous abscess of trunk, unspecified: Secondary | ICD-10-CM

## 2019-03-28 DIAGNOSIS — Z22322 Carrier or suspected carrier of Methicillin resistant Staphylococcus aureus: Secondary | ICD-10-CM

## 2019-03-28 DIAGNOSIS — L03319 Cellulitis of trunk, unspecified: Secondary | ICD-10-CM

## 2019-03-28 MED ORDER — ESZOPICLONE 3 MG PO TABS: 3.0000 mg | ORAL_TABLET | Freq: Every day | ORAL | 0 refills | Status: DC

## 2019-03-28 NOTE — Progress Notes (Signed)
HPI:                                                                Earl [name] is a 53 y.o. male who presents to [place]: [facility] today for wound check  Patient underwent I&D on 03/04/19 for abscess of left axilla He has a history of MRSA cellulitis of nasal wall that required hospitalization (12/04/2018-12/08/18) and Zyvox  03/08/19 - He was switched to Linezolid yesterday and has had 3 doses - CBC showed leukocytosis with left shift (neutrophils 11,534) - wound cx was positive for MRSA that was sensitive to Clindamycin, however, patients cellulitis was worsening while taking Clindamycin, so he was switched to Linezolid - still having significant pain and purulent drainage from incision site. Reports IM Toradol provides the most significant pain relief - denies fever or malaise today  03/10/19 - He is on day 4 of Linezolid - abscess was drained further and re-packed on 03/08/19  - abscess continues to drain copious amounts of purulent material that soaks through his clothing/bedding - redness continues to improve - no fever/chills  03/21/19 - packing was removed by Dr. Dianah Field last week. He was continued on Linezolid for an additional week. Has been having loose stools related to antibiotic use - denies any drainage, tenderness, warmth or redness - reports incision site is still open and has not healed  03/28/19 - antibiotics were de-escalated from Linezolid to Clindamycin and he completed this last week - repeat CBC 03/21/19  was unremarkable - he was counseled on MRSA de-colonization protocol to complete twice monthly x 6 months and has completed one round so far  Past Medical History:  Diagnosis Date  . Abscess of nasal cavity 12/14/2018  . Acute urinary retention   . Arthritis   . Cellulitis of sidewall of nose 12/02/2018  . Depression   . Hemorrhoids   . History of skin cancer in adulthood 2012   chest wall  . Hypertension    . Hypogonadism, male   . Insomnia    Past Surgical History:  Procedure Laterality Date  . APPENDECTOMY    . COLONOSCOPY    . HERNIA REPAIR    . JOINT REPLACEMENT    . OSTEOTOMY    . SHOULDER ARTHROSCOPY Left 06/2018  . TOTAL HIP ARTHROPLASTY Right   . TOTAL KNEE ARTHROPLASTY Left    Social History   Tobacco Use  . Smoking status: Never Smoker  . Smokeless tobacco: Never Used  Substance Use Topics  . Alcohol use: Not Currently   family history includes Aneurysm in his brother; Heart disease in his father and sister; Hypertension in his father and sister; Skin cancer in his father; Stroke in his mother.    ROS: negative except as noted in the HPI  Medications: Current Outpatient Medications  Medication Sig Dispense Refill  . chlorhexidine (HIBICLENS) 4 % external liquid Bathe once daily x 5 days twice per month for 6 mos for MRSA de-colonization 236 mL 1  . chlorhexidine gluconate, MEDLINE KIT, (PERIDEX) 0.12 % solution Swish, gargle and spit twice daily for 5 days twice per month x 6 mos for MRSA de-colonization 473 mL 1  . eletriptan (RELPAX) 40 MG tablet Take 1 tablet (40 mg total)  by mouth as needed for migraine or headache. May repeat 1 add'l dose in 2 hours if migraine persist/recurs. Max dose 80 mg/24hr 9 tablet 1  . [START ON 04/13/2019] Eszopiclone (ESZOPICLONE) 3 MG TABS Take 1 tablet (3 mg total) by mouth at bedtime. 90 tablet 0  . ibuprofen (ADVIL) 800 MG tablet TAKE 1 TABLET BY MOUTH EVERY 8 HOURS AS NEEDED 90 tablet 0  . Multiple Vitamins-Minerals (MULTIVITAL PO) Take 1 tablet by mouth daily.     . mupirocin ointment (BACTROBAN) 2 % Apply intranasally twice a day x 5 days, twice monthly x 6 mos for MRSA de-colonization 22 g 1  . testosterone cypionate (DEPOTESTOSTERONE CYPIONATE) 200 MG/ML injection Inject 200 mg into the muscle every 14 (fourteen) days.      No current facility-administered medications for this visit.    Allergies  Allergen Reactions  .  Iodinated Diagnostic Agents Swelling    N/V - rash- red skin   . Morphine And Related Other (See Comments)    Irritability   . Temazepam Other (See Comments)    Mood change, irritability       Objective:  BP 135/83   Pulse 68   Temp 97.7 F (36.5 C) (Oral)   Wt 186 lb (84.4 kg)   BMI 28.28 kg/m  Physical Exam Skin:    General: Skin is warm and dry.     Findings: Wound (I&D site) present. No abscess or erythema.             Recent Results (from the past 2160 hour(s))  CBC with Differential/Platelet     Status: Abnormal   Collection Time: 12/28/18 11:24 AM  Result Value Ref Range   WBC 8.5 3.8 - 10.8 Thousand/uL   RBC 5.57 4.20 - 5.80 Million/uL   Hemoglobin 17.3 (H) 13.2 - 17.1 g/dL   HCT 49.4 38.5 - 50.0 %   MCV 88.7 80.0 - 100.0 fL   MCH 31.1 27.0 - 33.0 pg   MCHC 35.0 32.0 - 36.0 g/dL   RDW 13.0 11.0 - 15.0 %   Platelets 168 140 - 400 Thousand/uL   MPV 11.6 7.5 - 12.5 fL   Neutro Abs 5,721 1,500 - 7,800 cells/uL   Lymphs Abs 1,836 850 - 3,900 cells/uL   Absolute Monocytes 808 200 - 950 cells/uL   Eosinophils Absolute 102 15 - 500 cells/uL   Basophils Absolute 34 0 - 200 cells/uL   Neutrophils Relative % 67.3 %   Total Lymphocyte 21.6 %   Monocytes Relative 9.5 %   Eosinophils Relative 1.2 %   Basophils Relative 0.4 %  PTH, Intact and Calcium     Status: Abnormal   Collection Time: 02/14/19  4:48 PM  Result Value Ref Range   PTH 13 (L) 14 - 64 pg/mL    Comment: . Interpretive Guide    Intact PTH           Calcium ------------------    ----------           ------- Normal Parathyroid    Normal               Normal Hypoparathyroidism    Low or Low Normal    Low Hyperparathyroidism    Primary            Normal or High       High    Secondary          High  Normal or Low    Tertiary           High                 High Non-Parathyroid    Hypercalcemia      Low or Low Normal    High .    Calcium 10.1 8.6 - 10.3 mg/dL  Magnesium      Status: None   Collection Time: 02/14/19  4:48 PM  Result Value Ref Range   Magnesium 1.9 1.5 - 2.5 mg/dL  Phosphorus     Status: None   Collection Time: 02/14/19  4:48 PM  Result Value Ref Range   Phosphorus 3.4 2.5 - 4.5 mg/dL  VITAMIN D 25 Hydroxy (Vit-D Deficiency, Fractures)     Status: None   Collection Time: 02/14/19  4:48 PM  Result Value Ref Range   Vit D, 25-Hydroxy 65 30 - 100 ng/mL    Comment: Vitamin D Status         25-OH Vitamin D: . Deficiency:                    <20 ng/mL Insufficiency:             20 - 29 ng/mL Optimal:                 > or = 30 ng/mL . For 25-OH Vitamin D testing on patients on  D2-supplementation and patients for whom quantitation  of D2 and D3 fractions is required, the [facility](TM) 25-OH VIT D, (D2,D3), LC/MS/MS is recommended: order  code [id number] (patients >20yr). See Note 1 . Note 1 . For additional information, please refer to  http://education.[facility].com/faq/FAQ199  (This link is being provided for informational/ educational purposes only.)   Wound culture     Status: Abnormal   Collection Time: 03/04/19 11:38 AM   Specimen: Wound  Result Value Ref Range   MICRO NUMBER: [id number]   SPECIMEN QUALITY: Adequate    SOURCE: NOT GIVEN    STATUS: FINAL    GRAM STAIN:      No white blood cells seen Rare epithelial cells Few Gram positive cocci in pairs   ISOLATE 1: methicillin resistant Staphylococcus aureus (A)     Comment: Heavy growth of Methicillin resistant Staphylococcus aureus (MRSA) Negative for inducible clindamycin resistance.      Susceptibility   Methicillin resistant staphylococcus aureus - AEROBIC CULT, GRAM STAIN POSITIVE 1    VANCOMYCIN 1 Sensitive     CIPROFLOXACIN >=8 Resistant     CLINDAMYCIN <=0.25 Sensitive     LEVOFLOXACIN 4 Resistant     ERYTHROMYCIN >=8 Resistant     GENTAMICIN <=0.5 Sensitive     OXACILLIN* NR Resistant      * Oxacillin-resistant staphylococci are resistant toall currently  available beta-lactam antimicrobialagents including penicillins, beta lactam/beta-lactamase inhibitor combinations, and cephems withstaphylococcal indications, including Cefazolin.    TETRACYCLINE >=16 Resistant     TRIMETH/SULFA* >=320 Resistant      * Oxacillin-resistant staphylococci are resistant toall currently available beta-lactam antimicrobialagents including penicillins, beta lactam/beta-lactamase inhibitor combinations, and cephems withstaphylococcal indications, including Cefazolin.Legend:S = Susceptible  I = IntermediateR = Resistant  NS = Not susceptible* = Not tested  NR = Not reported**NN = See antimicrobic comments  CBC with Differential/Platelet     Status: Abnormal   Collection Time: 03/07/19  9:09 AM  Result Value Ref Range   WBC 14.1 (H) 3.8 - 10.8 Thousand/uL   RBC 4.60 4.20 -  5.80 Million/uL   Hemoglobin 14.5 13.2 - 17.1 g/dL   HCT 41.0 38.5 - 50.0 %   MCV 89.1 80.0 - 100.0 fL   MCH 31.5 27.0 - 33.0 pg   MCHC 35.4 32.0 - 36.0 g/dL   RDW 12.0 11.0 - 15.0 %   Platelets 172 140 - 400 Thousand/uL   MPV 11.5 7.5 - 12.5 fL   Neutro Abs 11,534 (H) 1,500 - 7,800 cells/uL   Lymphs Abs 1,156 850 - 3,900 cells/uL   Absolute Monocytes 1,227 (H) 200 - 950 cells/uL   Eosinophils Absolute 127 15 - 500 cells/uL   Basophils Absolute 56 0 - 200 cells/uL   Neutrophils Relative % 81.8 %   Total Lymphocyte 8.2 %   Monocytes Relative 8.7 %   Eosinophils Relative 0.9 %   Basophils Relative 0.4 %  COMPLETE METABOLIC PANEL WITH GFR     Status: Abnormal   Collection Time: 03/07/19  9:09 AM  Result Value Ref Range   Glucose, Bld 104 (H) 65 - 99 mg/dL    Comment: .            Fasting reference interval . For someone without known diabetes, a glucose value between 100 and 125 mg/dL is consistent with prediabetes and should be confirmed with a follow-up test. .    BUN 22 7 - 25 mg/dL   Creat 0.89 0.70 - 1.33 mg/dL    Comment: For patients >59 years of age, the reference limit for  Creatinine is approximately 13% higher for people identified as African-American. .    GFR, Est Non African American 97 > OR = 60 mL/min/1.39m   GFR, Est African American 112 > OR = 60 mL/min/1.742m  BUN/Creatinine Ratio NOT APPLICABLE 6 - 22 (calc)   Sodium 137 135 - 146 mmol/L   Potassium 3.8 3.5 - 5.3 mmol/L   Chloride 100 98 - 110 mmol/L   CO2 28 20 - 32 mmol/L   Calcium 9.2 8.6 - 10.3 mg/dL   Total Protein 6.6 6.1 - 8.1 g/dL   Albumin 4.0 3.6 - 5.1 g/dL   Globulin 2.6 1.9 - 3.7 g/dL (calc)   AG Ratio 1.5 1.0 - 2.5 (calc)   Total Bilirubin 0.6 0.2 - 1.2 mg/dL   Alkaline phosphatase (APISO) 77 35 - 144 U/L   AST 21 10 - 35 U/L   ALT 19 9 - 46 U/L  CBC with Differential/Platelet     Status: None   Collection Time: 03/21/19  9:36 AM  Result Value Ref Range   WBC 5.7 3.8 - 10.8 Thousand/uL   RBC 5.36 4.20 - 5.80 Million/uL   Hemoglobin 16.6 13.2 - 17.1 g/dL   HCT 49.1 38.5 - 50.0 %   MCV 91.6 80.0 - 100.0 fL   MCH 31.0 27.0 - 33.0 pg   MCHC 33.8 32.0 - 36.0 g/dL   RDW 12.8 11.0 - 15.0 %   Platelets 193 140 - 400 Thousand/uL   MPV 10.7 7.5 - 12.5 fL   Neutro Abs 3,409 1,500 - 7,800 cells/uL   Lymphs Abs 1,630 850 - 3,900 cells/uL   Absolute Monocytes 502 200 - 950 cells/uL   Eosinophils Absolute 120 15 - 500 cells/uL   Basophils Absolute 40 0 - 200 cells/uL   Neutrophils Relative % 59.8 %   Total Lymphocyte 28.6 %   Monocytes Relative 8.8 %   Eosinophils Relative 2.1 %   Basophils Relative 0.7 %    Assessment and Plan: 53  y.o. male with   .Tadeo was seen today for follow-up.  Diagnoses and all orders for this visit:  MRSA (methicillin resistant staph aureus) culture positive  Chronic insomnia -     Eszopiclone (ESZOPICLONE) 3 MG TABS; Take 1 tablet (3 mg total) by mouth at bedtime.  Cellulitis and abscess of trunk    No evidence of abscess or cellulitis recurrence one xam MRSA de-colonization x 5 days, twice monthly for 6 months  Patient education and  anticipatory guidance given Patient agrees with treatment plan Follow-up in 3 months for insomnia/medication management or sooner as needed if symptoms worsen or fail to improve  Darlyne Russian PA-C

## 2019-04-04 ENCOUNTER — Encounter: Payer: Self-pay | Admitting: Sports Medicine

## 2019-04-08 ENCOUNTER — Other Ambulatory Visit: Payer: Self-pay

## 2019-04-08 ENCOUNTER — Ambulatory Visit (INDEPENDENT_AMBULATORY_CARE_PROVIDER_SITE_OTHER): Payer: BC Managed Care – PPO | Admitting: Sports Medicine

## 2019-04-08 ENCOUNTER — Encounter: Payer: Self-pay | Admitting: Sports Medicine

## 2019-04-08 DIAGNOSIS — Z9889 Other specified postprocedural states: Secondary | ICD-10-CM

## 2019-04-08 NOTE — Progress Notes (Signed)
Subjective:    CC: Left shoulder pain  HPI: Earl [name] returns, he is post left shoulder rotator cuff repair and extensive debridement, unfortunately he has had persistent pain, and has had not been able to get in touch with his surgeon.  He has worsening weakness, pain, mechanical symptoms in the left shoulder over the deltoid, worse with abduction.  I reviewed the past medical history, family history, social history, surgical history, and allergies today and no changes were needed.  Please see the problem list section below in epic for further details.  Past Medical History: Past Medical History:  Diagnosis Date  . Abscess of nasal cavity 12/14/2018  . Acute urinary retention   . Arthritis   . Cellulitis of sidewall of nose 12/02/2018  . Depression   . Hemorrhoids   . History of skin cancer in adulthood 2012   chest wall  . Hypertension   . Hypogonadism, male   . Insomnia    Past Surgical History: Past Surgical History:  Procedure Laterality Date  . APPENDECTOMY    . COLONOSCOPY    . HERNIA REPAIR    . JOINT REPLACEMENT    . OSTEOTOMY    . SHOULDER ARTHROSCOPY Left 06/2018  . TOTAL HIP ARTHROPLASTY Right   . TOTAL KNEE ARTHROPLASTY Left    Social History: Social History   Socioeconomic History  . Marital status: Divorced    Spouse name: Not on file  . Number of children: Not on file  . Years of education: Not on file  . Highest education level: Not on file  Occupational History  . Not on file  Social Needs  . Financial resource strain: Not on file  . Food insecurity    Worry: Not on file    Inability: Not on file  . Transportation needs    Medical: Not on file    Non-medical: Not on file  Tobacco Use  . Smoking status: Never Smoker  . Smokeless tobacco: Never Used  Substance and Sexual Activity  . Alcohol use: Not Currently  . Drug use: Never  . Sexual activity: Yes    Birth control/protection: None  Lifestyle  . Physical activity    Days per week: Not on  file    Minutes per session: Not on file  . Stress: Not on file  Relationships  . Social Herbalist on phone: Not on file    Gets together: Not on file    Attends religious service: Not on file    Active member of club or organization: Not on file    Attends meetings of clubs or organizations: Not on file    Relationship status: Not on file  Other Topics Concern  . Not on file  Social History Narrative  . Not on file   Family History: Family History  Problem Relation Age of Onset  . Stroke Mother   . Skin cancer Father   . Heart disease Father   . Hypertension Father   . Aneurysm Brother   . Hypertension Sister   . Heart disease Sister   . Cancer Neg Hx   . Heart attack Neg Hx    Allergies: Allergies  Allergen Reactions  . Iodinated Diagnostic Agents Swelling    N/V - rash- red skin   . Morphine And Related Other (See Comments)    Irritability   . Temazepam Other (See Comments)    Mood change, irritability   Medications: See med rec.  Review of Systems: No  fevers, chills, night sweats, weight loss, chest pain, or shortness of breath.   Objective:    General: Well Developed, well nourished, and in no acute distress.  Neuro: Alert and oriented x3, extra-ocular muscles intact, sensation grossly intact.  HEENT: Normocephalic, atraumatic, pupils equal round reactive to light, neck supple, no masses, no lymphadenopathy, thyroid nonpalpable.  Skin: Warm and dry, no rashes. Cardiac: Regular rate and rhythm, no murmurs rubs or gallops, no lower extremity edema.  Respiratory: Clear to auscultation bilaterally. Not using accessory muscles, speaking in full sentences. Left shoulder: Weak to abduction, all of the impingement signs are positive.  Impression and Recommendations:    Status post left rotator cuff repair Left-sided rotator cuff repair with Earl [name], cuff repair with extensive debridement. He is having significant mechanical symptoms, popping,  catching, and there is concern for reinjury. I am going to go ahead and get him teed up for a new MRI Monday evening, and he does have an appointment for a second surgical opinion with Earl [name] next week. He understands to keep his postsurgical questions with his surgeons rather than me from now on.   ___________________________________________ Earl [name], M.D., ABFM., CAQSM. Primary Care and Sports Medicine [facility]  Adjunct Professor of Family Medicine  [employer]

## 2019-04-08 NOTE — Assessment & Plan Note (Signed)
Left-sided rotator cuff repair with Dr. Dala Dock, cuff repair with extensive debridement. He is having significant mechanical symptoms, popping, catching, and there is concern for reinjury. I am going to go ahead and get him teed up for a new MRI Monday evening, and he does have an appointment for a second surgical opinion with Dr. Griffin Basil next week. He understands to keep his postsurgical questions with his surgeons rather than me from now on.

## 2019-04-13 ENCOUNTER — Other Ambulatory Visit: Payer: Self-pay | Admitting: Sports Medicine

## 2019-04-13 ENCOUNTER — Ambulatory Visit (HOSPITAL_COMMUNITY): Payer: BC Managed Care – PPO

## 2019-04-13 DIAGNOSIS — S46012A Strain of muscle(s) and tendon(s) of the rotator cuff of left shoulder, initial encounter: Secondary | ICD-10-CM

## 2019-04-13 DIAGNOSIS — Z9889 Other specified postprocedural states: Secondary | ICD-10-CM

## 2019-04-17 ENCOUNTER — Other Ambulatory Visit: Payer: Self-pay

## 2019-04-17 ENCOUNTER — Ambulatory Visit (INDEPENDENT_AMBULATORY_CARE_PROVIDER_SITE_OTHER): Payer: BC Managed Care – PPO

## 2019-04-17 DIAGNOSIS — M75122 Complete rotator cuff tear or rupture of left shoulder, not specified as traumatic: Secondary | ICD-10-CM | POA: Diagnosis not present

## 2019-04-17 DIAGNOSIS — M19012 Primary osteoarthritis, left shoulder: Secondary | ICD-10-CM | POA: Diagnosis not present

## 2019-04-17 DIAGNOSIS — Z9889 Other specified postprocedural states: Secondary | ICD-10-CM

## 2019-04-18 ENCOUNTER — Encounter: Payer: Self-pay | Admitting: Sports Medicine

## 2019-04-18 DIAGNOSIS — Z96652 Presence of left artificial knee joint: Secondary | ICD-10-CM | POA: Diagnosis not present

## 2019-04-18 DIAGNOSIS — Z96659 Presence of unspecified artificial knee joint: Secondary | ICD-10-CM | POA: Diagnosis not present

## 2019-04-18 DIAGNOSIS — T8484XD Pain due to internal orthopedic prosthetic devices, implants and grafts, subsequent encounter: Secondary | ICD-10-CM | POA: Diagnosis not present

## 2019-04-18 DIAGNOSIS — Z9889 Other specified postprocedural states: Secondary | ICD-10-CM | POA: Diagnosis not present

## 2019-04-18 DIAGNOSIS — M25562 Pain in left knee: Secondary | ICD-10-CM | POA: Diagnosis not present

## 2019-04-19 DIAGNOSIS — M25512 Pain in left shoulder: Secondary | ICD-10-CM | POA: Diagnosis not present

## 2019-04-20 ENCOUNTER — Encounter: Payer: Self-pay | Admitting: Sports Medicine

## 2019-04-21 DIAGNOSIS — C801 Malignant (primary) neoplasm, unspecified: Secondary | ICD-10-CM | POA: Diagnosis not present

## 2019-04-21 DIAGNOSIS — T84398A Other mechanical complication of other bone devices, implants and grafts, initial encounter: Secondary | ICD-10-CM | POA: Diagnosis not present

## 2019-04-21 DIAGNOSIS — M75102 Unspecified rotator cuff tear or rupture of left shoulder, not specified as traumatic: Secondary | ICD-10-CM | POA: Diagnosis not present

## 2019-04-21 DIAGNOSIS — Z79899 Other long term (current) drug therapy: Secondary | ICD-10-CM | POA: Diagnosis not present

## 2019-04-21 DIAGNOSIS — G8918 Other acute postprocedural pain: Secondary | ICD-10-CM | POA: Diagnosis not present

## 2019-04-21 DIAGNOSIS — I10 Essential (primary) hypertension: Secondary | ICD-10-CM | POA: Diagnosis not present

## 2019-04-21 DIAGNOSIS — S43492A Other sprain of left shoulder joint, initial encounter: Secondary | ICD-10-CM | POA: Diagnosis not present

## 2019-04-21 DIAGNOSIS — Z91041 Radiographic dye allergy status: Secondary | ICD-10-CM | POA: Diagnosis not present

## 2019-04-21 DIAGNOSIS — Z885 Allergy status to narcotic agent status: Secondary | ICD-10-CM | POA: Diagnosis not present

## 2019-04-21 DIAGNOSIS — S46012A Strain of muscle(s) and tendon(s) of the rotator cuff of left shoulder, initial encounter: Secondary | ICD-10-CM | POA: Diagnosis not present

## 2019-04-21 DIAGNOSIS — M1612 Unilateral primary osteoarthritis, left hip: Secondary | ICD-10-CM | POA: Diagnosis not present

## 2019-04-21 DIAGNOSIS — L309 Dermatitis, unspecified: Secondary | ICD-10-CM | POA: Diagnosis not present

## 2019-04-22 ENCOUNTER — Ambulatory Visit (INDEPENDENT_AMBULATORY_CARE_PROVIDER_SITE_OTHER): Payer: BC Managed Care – PPO | Admitting: Physician Assistant

## 2019-04-22 DIAGNOSIS — Z23 Encounter for immunization: Secondary | ICD-10-CM | POA: Diagnosis not present

## 2019-04-28 ENCOUNTER — Other Ambulatory Visit: Payer: Self-pay | Admitting: Sports Medicine

## 2019-04-28 DIAGNOSIS — N529 Male erectile dysfunction, unspecified: Secondary | ICD-10-CM

## 2019-04-28 DIAGNOSIS — T84033D Mechanical loosening of internal left knee prosthetic joint, subsequent encounter: Secondary | ICD-10-CM | POA: Diagnosis not present

## 2019-04-28 NOTE — Telephone Encounter (Signed)
To PCP

## 2019-05-09 ENCOUNTER — Other Ambulatory Visit: Payer: Self-pay | Admitting: Physician Assistant

## 2019-05-09 DIAGNOSIS — S46012A Strain of muscle(s) and tendon(s) of the rotator cuff of left shoulder, initial encounter: Secondary | ICD-10-CM

## 2019-05-16 DIAGNOSIS — F3341 Major depressive disorder, recurrent, in partial remission: Secondary | ICD-10-CM | POA: Diagnosis not present

## 2019-05-16 DIAGNOSIS — G8928 Other chronic postprocedural pain: Secondary | ICD-10-CM | POA: Diagnosis not present

## 2019-05-16 DIAGNOSIS — F0631 Mood disorder due to known physiological condition with depressive features: Secondary | ICD-10-CM | POA: Diagnosis not present

## 2019-05-31 DIAGNOSIS — Z01812 Encounter for preprocedural laboratory examination: Secondary | ICD-10-CM | POA: Diagnosis not present

## 2019-05-31 DIAGNOSIS — M545 Low back pain: Secondary | ICD-10-CM | POA: Diagnosis not present

## 2019-05-31 DIAGNOSIS — G8929 Other chronic pain: Secondary | ICD-10-CM | POA: Diagnosis not present

## 2019-05-31 DIAGNOSIS — Z20828 Contact with and (suspected) exposure to other viral communicable diseases: Secondary | ICD-10-CM | POA: Diagnosis not present

## 2019-05-31 DIAGNOSIS — G8921 Chronic pain due to trauma: Secondary | ICD-10-CM | POA: Diagnosis not present

## 2019-05-31 DIAGNOSIS — M25562 Pain in left knee: Secondary | ICD-10-CM | POA: Diagnosis not present

## 2019-05-31 DIAGNOSIS — T84033D Mechanical loosening of internal left knee prosthetic joint, subsequent encounter: Secondary | ICD-10-CM | POA: Diagnosis not present

## 2019-05-31 DIAGNOSIS — T84033S Mechanical loosening of internal left knee prosthetic joint, sequela: Secondary | ICD-10-CM | POA: Diagnosis not present

## 2019-06-02 ENCOUNTER — Other Ambulatory Visit: Payer: Self-pay | Admitting: Physician Assistant

## 2019-06-02 DIAGNOSIS — N529 Male erectile dysfunction, unspecified: Secondary | ICD-10-CM

## 2019-06-02 DIAGNOSIS — S46012A Strain of muscle(s) and tendon(s) of the rotator cuff of left shoulder, initial encounter: Secondary | ICD-10-CM

## 2019-06-08 DIAGNOSIS — T84033D Mechanical loosening of internal left knee prosthetic joint, subsequent encounter: Secondary | ICD-10-CM | POA: Diagnosis not present

## 2019-06-08 DIAGNOSIS — G8929 Other chronic pain: Secondary | ICD-10-CM | POA: Diagnosis not present

## 2019-06-08 DIAGNOSIS — G4700 Insomnia, unspecified: Secondary | ICD-10-CM | POA: Diagnosis not present

## 2019-06-08 DIAGNOSIS — G8918 Other acute postprocedural pain: Secondary | ICD-10-CM | POA: Diagnosis not present

## 2019-06-08 DIAGNOSIS — Z96652 Presence of left artificial knee joint: Secondary | ICD-10-CM | POA: Diagnosis not present

## 2019-06-08 DIAGNOSIS — M25562 Pain in left knee: Secondary | ICD-10-CM | POA: Diagnosis not present

## 2019-06-08 DIAGNOSIS — G43909 Migraine, unspecified, not intractable, without status migrainosus: Secondary | ICD-10-CM | POA: Diagnosis not present

## 2019-06-08 DIAGNOSIS — Z96649 Presence of unspecified artificial hip joint: Secondary | ICD-10-CM | POA: Diagnosis not present

## 2019-06-08 DIAGNOSIS — F329 Major depressive disorder, single episode, unspecified: Secondary | ICD-10-CM | POA: Diagnosis not present

## 2019-06-08 DIAGNOSIS — T84031A Mechanical loosening of internal left hip prosthetic joint, initial encounter: Secondary | ICD-10-CM | POA: Diagnosis not present

## 2019-06-08 DIAGNOSIS — T84033A Mechanical loosening of internal left knee prosthetic joint, initial encounter: Secondary | ICD-10-CM | POA: Diagnosis not present

## 2019-06-12 DIAGNOSIS — M25562 Pain in left knee: Secondary | ICD-10-CM | POA: Diagnosis not present

## 2019-06-12 DIAGNOSIS — Z96652 Presence of left artificial knee joint: Secondary | ICD-10-CM | POA: Diagnosis not present

## 2019-06-12 DIAGNOSIS — G8918 Other acute postprocedural pain: Secondary | ICD-10-CM | POA: Diagnosis not present

## 2019-06-27 DIAGNOSIS — M25562 Pain in left knee: Secondary | ICD-10-CM | POA: Diagnosis not present

## 2019-06-27 DIAGNOSIS — T84033S Mechanical loosening of internal left knee prosthetic joint, sequela: Secondary | ICD-10-CM | POA: Diagnosis not present

## 2019-06-28 ENCOUNTER — Other Ambulatory Visit: Payer: Self-pay | Admitting: Sports Medicine

## 2019-06-28 DIAGNOSIS — S46012A Strain of muscle(s) and tendon(s) of the rotator cuff of left shoulder, initial encounter: Secondary | ICD-10-CM

## 2019-06-29 DIAGNOSIS — T84033S Mechanical loosening of internal left knee prosthetic joint, sequela: Secondary | ICD-10-CM | POA: Diagnosis not present

## 2019-06-29 DIAGNOSIS — M25562 Pain in left knee: Secondary | ICD-10-CM | POA: Diagnosis not present

## 2019-07-01 ENCOUNTER — Ambulatory Visit: Payer: BC Managed Care – PPO

## 2019-07-01 ENCOUNTER — Telehealth: Payer: Self-pay

## 2019-07-01 ENCOUNTER — Ambulatory Visit (INDEPENDENT_AMBULATORY_CARE_PROVIDER_SITE_OTHER): Payer: BC Managed Care – PPO | Admitting: Sports Medicine

## 2019-07-01 ENCOUNTER — Ambulatory Visit (INDEPENDENT_AMBULATORY_CARE_PROVIDER_SITE_OTHER): Payer: BC Managed Care – PPO

## 2019-07-01 ENCOUNTER — Other Ambulatory Visit: Payer: Self-pay | Admitting: Family Medicine

## 2019-07-01 ENCOUNTER — Other Ambulatory Visit: Payer: Self-pay

## 2019-07-01 ENCOUNTER — Encounter: Payer: Self-pay | Admitting: Sports Medicine

## 2019-07-01 DIAGNOSIS — M79662 Pain in left lower leg: Secondary | ICD-10-CM | POA: Diagnosis not present

## 2019-07-01 DIAGNOSIS — M79605 Pain in left leg: Secondary | ICD-10-CM

## 2019-07-01 DIAGNOSIS — M7989 Other specified soft tissue disorders: Secondary | ICD-10-CM | POA: Diagnosis not present

## 2019-07-01 MED ORDER — FENTANYL 25 MCG/HR TD PT72: 1.0000 | MEDICATED_PATCH | TRANSDERMAL | 0 refills | Status: DC

## 2019-07-01 NOTE — Assessment & Plan Note (Signed)
Pain in the left calf, DVT ultrasound was negative. He is recently post revision arthroplasty on the left with a longer tibial stem. I would like tib-fib x-rays. Oral medications have not been quite as efficacious for him, it is possible that he does not well express the enzyme needed for metabolization of narcotics into the reactive form, parenteral medications work well, we are going to bypass first-pass metabolism with a fentanyl patch for a few days.  Further pain management will be through his orthopedic surgeon.

## 2019-07-01 NOTE — Telephone Encounter (Signed)
Patient called and was scheduled for appt with Dr T today at 2:45. Patient has complaints of intense left calf pain and is afraid it could possibly be a blood clot.   Order pended, can we go ahead and have pt go do imaging prior to his appt? Please advise    I talked to Imaging, if this is OK with you to order, they can scan him at 1:30 which will give 30 minutes for exam and 30 minutes for imaging to be read.

## 2019-07-01 NOTE — Progress Notes (Signed)
Subjective:    CC: Left leg pain  HPI: [place] is a piece of work, he recently had a revision left knee arthroplasty, he had developed severe pain in his left posterior mid lower leg.  Moderate swelling, significantly worrisome for a DVT.  We obtained a stat DVT ultrasound that was negative for superficial or deep venous thromboses.  He really does not have a whole lot of pain at the knee itself but he does have somewhat of a longer tibial stem for his revision.  Pain is severe, persistent, localized without radiation, he does note that relatively high dose oral narcotic medications have not been that effective but parenteral delivery has been much more effective.   I reviewed the past medical history, family history, social history, surgical history, and allergies today and no changes were needed.  Please see the problem list section below in epic for further details.  Past Medical History: Past Medical History:  Diagnosis Date  . Abscess of nasal cavity 12/14/2018  . Acute urinary retention   . Arthritis   . Cellulitis of sidewall of nose 12/02/2018  . Depression   . Hemorrhoids   . History of skin cancer in adulthood 2012   chest wall  . Hypertension   . Hypogonadism, male   . Insomnia    Past Surgical History: Past Surgical History:  Procedure Laterality Date  . APPENDECTOMY    . COLONOSCOPY    . HERNIA REPAIR    . JOINT REPLACEMENT    . OSTEOTOMY    . SHOULDER ARTHROSCOPY Left 06/2018  . TOTAL HIP ARTHROPLASTY Right   . TOTAL KNEE ARTHROPLASTY Left    Social History: Social History   Socioeconomic History  . Marital status: Divorced    Spouse name: Not on file  . Number of children: Not on file  . Years of education: Not on file  . Highest education level: Not on file  Occupational History  . Not on file  Social Needs  . Financial resource strain: Not on file  . Food insecurity    Worry: Not on file    Inability: Not on file  . Transportation needs    Medical:  Not on file    Non-medical: Not on file  Tobacco Use  . Smoking status: Never Smoker  . Smokeless tobacco: Never Used  Substance and Sexual Activity  . Alcohol use: Not Currently  . Drug use: Never  . Sexual activity: Yes    Birth control/protection: None  Lifestyle  . Physical activity    Days per week: Not on file    Minutes per session: Not on file  . Stress: Not on file  Relationships  . Social Herbalist on phone: Not on file    Gets together: Not on file    Attends religious service: Not on file    Active member of club or organization: Not on file    Attends meetings of clubs or organizations: Not on file    Relationship status: Not on file  Other Topics Concern  . Not on file  Social History Narrative  . Not on file   Family History: Family History  Problem Relation Age of Onset  . Stroke Mother   . Skin cancer Father   . Heart disease Father   . Hypertension Father   . Aneurysm Brother   . Hypertension Sister   . Heart disease Sister   . Cancer Neg Hx   . Heart attack Neg  Hx    Allergies: Allergies  Allergen Reactions  . Iodinated Diagnostic Agents Swelling    N/V - rash- red skin   . Morphine And Related Other (See Comments)    Irritability   . Temazepam Other (See Comments)    Mood change, irritability   Medications: See med rec.  Review of Systems: No fevers, chills, night sweats, weight loss, chest pain, or shortness of breath.   Objective:    General: Well Developed, well nourished, and in no acute distress.  Neuro: Alert and oriented x3, extra-ocular muscles intact, sensation grossly intact.  HEENT: Normocephalic, atraumatic, pupils equal round reactive to light, neck supple, no masses, no lymphadenopathy, thyroid nonpalpable.  Skin: Warm and dry, no rashes. Cardiac: Regular rate and rhythm, no murmurs rubs or gallops, no lower extremity edema.  Respiratory: Clear to auscultation bilaterally. Not using accessory muscles, speaking  in full sentences. Left leg: Expected swelling of the left knee, incision is clean, dry, intact.  He has mild swelling in the left lower leg with a positive Denna Haggard' sign and exquisite point tenderness on the posterior mid lower leg.  Stat left lower extremity DVT ultrasound is negative.  Impression and Recommendations:    Left leg pain Pain in the left calf, DVT ultrasound was negative. He is recently post revision arthroplasty on the left with a longer tibial stem. I would like tib-fib x-rays. Oral medications have not been quite as efficacious for him, it is possible that he does not well express the enzyme needed for metabolization of narcotics into the reactive form, parenteral medications work well, we are going to bypass first-pass metabolism with a fentanyl patch for a few days.  Further pain management will be through his orthopedic surgeon.   ___________________________________________ Ihor Austin. Benjamin Stain, M.D., ABFM., CAQSM. Primary Care and Sports Medicine [facility]  Adjunct Professor of Family Medicine  [employer]

## 2019-07-02 IMAGING — DX DG SHOULDER 2+V*L*
3 series · 3 of 3 positions shown · non-contrast
Comparison: None.

CLINICAL DATA: Left shoulder pain after injury playing basketball.

EXAM:
LEFT SHOULDER - 2+ VIEW

[shoulder grashey]
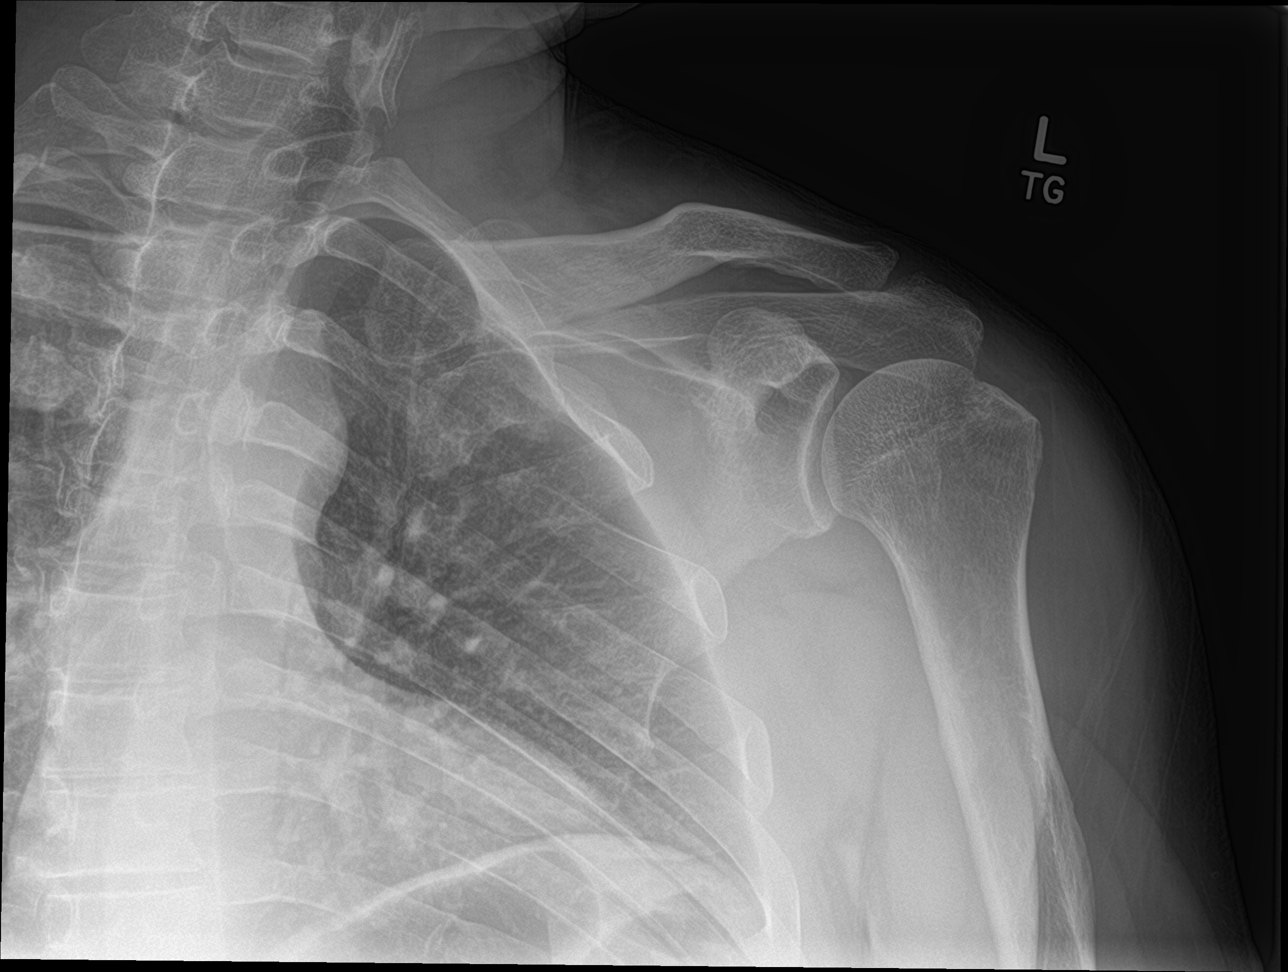

[shoulder y view]
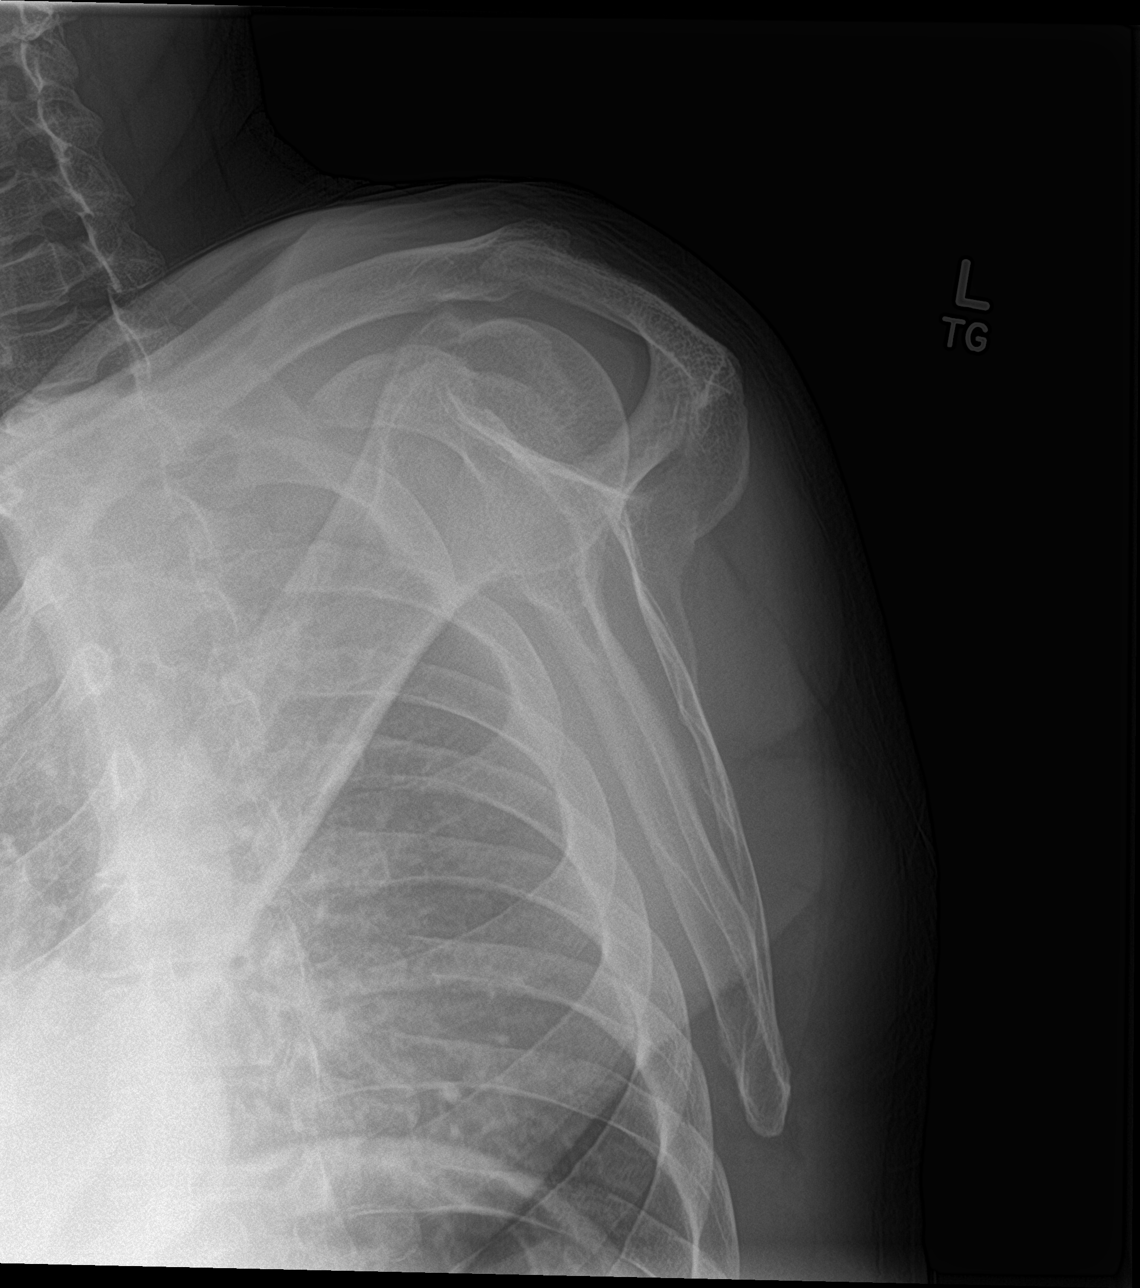

[shoulder axillary]
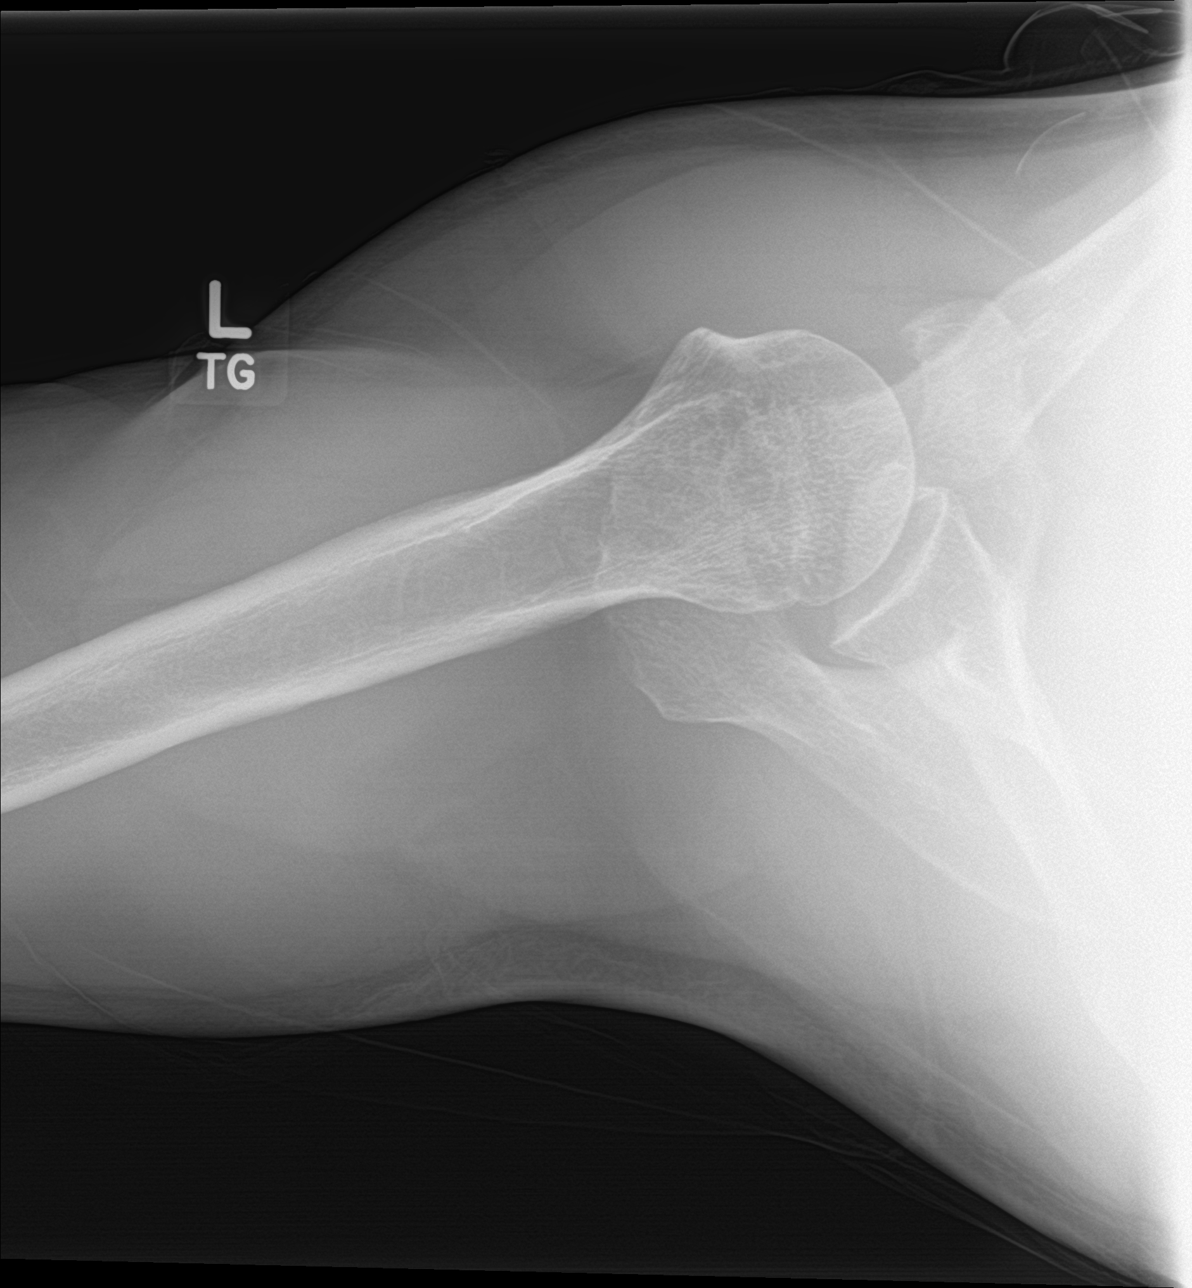

[3 of 3 positions shown; findings below may reference images not displayed]

FINDINGS: There is no evidence of fracture or dislocation. There is no
evidence of arthropathy or other focal bone abnormality. Soft
tissues are unremarkable.
IMPRESSION: Negative.

## 2019-07-04 ENCOUNTER — Encounter: Payer: Self-pay | Admitting: Sports Medicine

## 2019-07-04 DIAGNOSIS — M79605 Pain in left leg: Secondary | ICD-10-CM

## 2019-07-04 MED ORDER — FENTANYL 50 MCG/HR TD PT72: 1.0000 | MEDICATED_PATCH | TRANSDERMAL | 0 refills | Status: DC

## 2019-07-04 NOTE — Telephone Encounter (Signed)
Recently saw patient.

## 2019-07-05 ENCOUNTER — Other Ambulatory Visit: Payer: Self-pay | Admitting: Physician Assistant

## 2019-07-05 ENCOUNTER — Telehealth: Payer: Self-pay | Admitting: Sports Medicine

## 2019-07-05 DIAGNOSIS — F5104 Psychophysiologic insomnia: Secondary | ICD-10-CM

## 2019-07-05 NOTE — Telephone Encounter (Signed)
Received fax for [place] on Fentanyl patch sent through cover my meds waiting on determination. - CF

## 2019-07-06 MED ORDER — ESZOPICLONE 3 MG PO TABS: 3.0000 mg | ORAL_TABLET | Freq: Every day | ORAL | 0 refills | Status: DC

## 2019-07-07 DIAGNOSIS — M79662 Pain in left lower leg: Secondary | ICD-10-CM | POA: Diagnosis not present

## 2019-07-07 DIAGNOSIS — Z471 Aftercare following joint replacement surgery: Secondary | ICD-10-CM | POA: Diagnosis not present

## 2019-07-07 DIAGNOSIS — Z96652 Presence of left artificial knee joint: Secondary | ICD-10-CM | POA: Diagnosis not present

## 2019-07-11 DIAGNOSIS — M25562 Pain in left knee: Secondary | ICD-10-CM | POA: Diagnosis not present

## 2019-07-11 DIAGNOSIS — T84033S Mechanical loosening of internal left knee prosthetic joint, sequela: Secondary | ICD-10-CM | POA: Diagnosis not present

## 2019-07-14 DIAGNOSIS — M25562 Pain in left knee: Secondary | ICD-10-CM | POA: Diagnosis not present

## 2019-07-14 DIAGNOSIS — T84033S Mechanical loosening of internal left knee prosthetic joint, sequela: Secondary | ICD-10-CM | POA: Diagnosis not present

## 2019-07-18 ENCOUNTER — Encounter: Payer: Self-pay | Admitting: Sports Medicine

## 2019-07-18 DIAGNOSIS — M79605 Pain in left leg: Secondary | ICD-10-CM

## 2019-07-18 NOTE — Telephone Encounter (Signed)
Received fax from [facility] and fentanyl patch has been approved.   Reference Number: [id number] Valid: 07/04/19 - 12/31/19  I will notify pharmacy - CF

## 2019-07-20 DIAGNOSIS — T84033S Mechanical loosening of internal left knee prosthetic joint, sequela: Secondary | ICD-10-CM | POA: Diagnosis not present

## 2019-07-20 DIAGNOSIS — M25562 Pain in left knee: Secondary | ICD-10-CM | POA: Diagnosis not present

## 2019-07-20 MED ORDER — FENTANYL 25 MCG/HR TD PT72: MEDICATED_PATCH | TRANSDERMAL | 0 refills | Status: DC

## 2019-08-03 DIAGNOSIS — M25562 Pain in left knee: Secondary | ICD-10-CM | POA: Diagnosis not present

## 2019-08-03 DIAGNOSIS — T84033S Mechanical loosening of internal left knee prosthetic joint, sequela: Secondary | ICD-10-CM | POA: Diagnosis not present

## 2019-08-03 DIAGNOSIS — Z85828 Personal history of other malignant neoplasm of skin: Secondary | ICD-10-CM | POA: Diagnosis not present

## 2019-08-03 DIAGNOSIS — L570 Actinic keratosis: Secondary | ICD-10-CM | POA: Diagnosis not present

## 2019-08-03 DIAGNOSIS — Z1283 Encounter for screening for malignant neoplasm of skin: Secondary | ICD-10-CM | POA: Diagnosis not present

## 2019-08-04 DIAGNOSIS — Z471 Aftercare following joint replacement surgery: Secondary | ICD-10-CM | POA: Diagnosis not present

## 2019-08-04 DIAGNOSIS — Z96652 Presence of left artificial knee joint: Secondary | ICD-10-CM | POA: Diagnosis not present

## 2019-08-04 DIAGNOSIS — M25462 Effusion, left knee: Secondary | ICD-10-CM | POA: Diagnosis not present

## 2019-08-08 DIAGNOSIS — G8929 Other chronic pain: Secondary | ICD-10-CM | POA: Diagnosis not present

## 2019-08-08 DIAGNOSIS — M25561 Pain in right knee: Secondary | ICD-10-CM | POA: Diagnosis not present

## 2019-08-08 DIAGNOSIS — E349 Endocrine disorder, unspecified: Secondary | ICD-10-CM | POA: Diagnosis not present

## 2019-08-09 DIAGNOSIS — E349 Endocrine disorder, unspecified: Secondary | ICD-10-CM | POA: Diagnosis not present

## 2019-08-16 ENCOUNTER — Other Ambulatory Visit: Payer: Self-pay | Admitting: Osteopathic Medicine

## 2019-08-16 DIAGNOSIS — S46012A Strain of muscle(s) and tendon(s) of the rotator cuff of left shoulder, initial encounter: Secondary | ICD-10-CM

## 2019-08-16 NOTE — Telephone Encounter (Signed)
[facility] pharmacy requesting med refill for ibuprofen 800 mg.

## 2019-08-16 NOTE — Telephone Encounter (Signed)
Requested medication (s) are due for refill today: yes  Requested medication (s) are on the active medication list: yes  Last refill:  06/28/2019  Future visit scheduled: no  Notes to clinic: review for refill  Requested Prescriptions  Pending Prescriptions Disp Refills   ibuprofen (ADVIL) 800 MG tablet [Pharmacy Med Name: IBUPROFEN 800 MG TABLET] 90 tablet 0    Sig: TAKE 1 TABLET BY MOUTH EVERY 8 HOURS AS NEEDED      Analgesics:  NSAIDS Passed - 08/16/2019  1:34 AM      Passed - Cr in normal range and within 360 days    Creat  Date Value Ref Range Status  03/07/2019 0.89 0.70 - 1.33 mg/dL Final    Comment:    For patients >29 years of age, the reference limit for Creatinine is approximately 13% higher for people identified as African-American. .    Creatinine, Urine  Date Value Ref Range Status  02/24/2018 115 20 - 320 mg/dL Final          Passed - HGB in normal range and within 360 days    Hemoglobin  Date Value Ref Range Status  03/21/2019 16.6 13.2 - 17.1 g/dL Final          Passed - Patient is not pregnant      Passed - Valid encounter within last 12 months    Recent Outpatient Visits           1 month ago Left leg pain   [facility], Gwen Her, MD   4 months ago Status post left rotator cuff repair   [facility], Gwen Her, MD   4 months ago MRSA (methicillin resistant staph aureus) culture positive   [facility], Elson Areas, PA-C   4 months ago Medication monitoring encounter   [place], PA-C   5 months ago Chronic insomnia   [facility], Gwen Her, MD

## 2019-08-17 ENCOUNTER — Other Ambulatory Visit: Payer: Self-pay | Admitting: Physician Assistant

## 2019-08-17 DIAGNOSIS — G43019 Migraine without aura, intractable, without status migrainosus: Secondary | ICD-10-CM

## 2019-08-23 DIAGNOSIS — Z20828 Contact with and (suspected) exposure to other viral communicable diseases: Secondary | ICD-10-CM | POA: Diagnosis not present

## 2019-08-25 DIAGNOSIS — E349 Endocrine disorder, unspecified: Secondary | ICD-10-CM | POA: Diagnosis not present

## 2019-08-29 ENCOUNTER — Encounter: Payer: Self-pay | Admitting: Sports Medicine

## 2019-08-29 ENCOUNTER — Other Ambulatory Visit: Payer: Self-pay

## 2019-08-29 ENCOUNTER — Ambulatory Visit (INDEPENDENT_AMBULATORY_CARE_PROVIDER_SITE_OTHER): Payer: BC Managed Care – PPO | Admitting: Sports Medicine

## 2019-08-29 ENCOUNTER — Ambulatory Visit (INDEPENDENT_AMBULATORY_CARE_PROVIDER_SITE_OTHER): Payer: BC Managed Care – PPO

## 2019-08-29 DIAGNOSIS — M532X8 Spinal instabilities, sacral and sacrococcygeal region: Secondary | ICD-10-CM

## 2019-08-29 DIAGNOSIS — M533 Sacrococcygeal disorders, not elsewhere classified: Secondary | ICD-10-CM | POA: Diagnosis not present

## 2019-08-29 DIAGNOSIS — M545 Low back pain: Secondary | ICD-10-CM | POA: Diagnosis not present

## 2019-08-29 MED ORDER — PREDNISONE 50 MG PO TABS: ORAL_TABLET | ORAL | 0 refills | Status: DC

## 2019-08-29 MED ORDER — TRAMADOL HCL 50 MG PO TABS: 50.0000 mg | ORAL_TABLET | Freq: Three times a day (TID) | ORAL | 0 refills | Status: DC | PRN

## 2019-08-29 NOTE — Progress Notes (Addendum)
    Procedures performed today:    None.  Independent interpretation of tests performed by another provider:   I personally reviewed his sacral and lumbar spine x-rays, there does appear to be lucency around one of the sacral fusion rods, this is concerning for loosening.  I have discussed this with radiology and they will addend the report.  Impression and Recommendations:    Post right SI joint fusion History of SI joint fusion about 2 years ago with Dr. Yevette Edwards, increasing pain at the right SI joint but also medially in the paraspinals. Nothing overtly radicular, no signs or symptoms of cauda equina syndrome. We are going to start conservatively with a burst of prednisone and tramadol. X-rays of the SI joint to evaluate fusion, lumbar spine x-rays. It does certainly look like one of the rods, the most distal rod has lucency around it concerning for loosening. Adding a DEXA scan. I did tell him that if this does not work certainly he needs to follow-up with Dr. Yevette Edwards, and that we also need to consider a lumbar spine source of the pain. Return to see me in 1 month    ___________________________________________ Ihor Austin. Benjamin Stain, M.D., ABFM., CAQSM. Primary Care and Sports Medicine [facility]  Adjunct Instructor of Family Medicine  [employer]

## 2019-08-29 NOTE — Assessment & Plan Note (Addendum)
History of SI joint fusion about 2 years ago with Dr. Yevette Edwards, increasing pain at the right SI joint but also medially in the paraspinals. Nothing overtly radicular, no signs or symptoms of cauda equina syndrome. We are going to start conservatively with a burst of prednisone and tramadol. X-rays of the SI joint to evaluate fusion, lumbar spine x-rays. It does certainly look like one of the rods, the most distal rod has lucency around it concerning for loosening. Adding a DEXA scan. I did tell him that if this does not work certainly he needs to follow-up with Dr. Yevette Edwards, and that we also need to consider a lumbar spine source of the pain. Return to see me in 1 month

## 2019-08-29 NOTE — Addendum Note (Signed)
Addended by: Monica Becton on: 08/29/2019 11:58 AM   Modules accepted: Orders

## 2019-09-05 DIAGNOSIS — M545 Low back pain: Secondary | ICD-10-CM | POA: Diagnosis not present

## 2019-09-13 ENCOUNTER — Other Ambulatory Visit: Payer: Self-pay | Admitting: Sports Medicine

## 2019-09-13 DIAGNOSIS — N529 Male erectile dysfunction, unspecified: Secondary | ICD-10-CM

## 2019-09-13 NOTE — Telephone Encounter (Signed)
Dr A is covering today

## 2019-09-29 ENCOUNTER — Other Ambulatory Visit: Payer: Self-pay

## 2019-09-29 ENCOUNTER — Ambulatory Visit (INDEPENDENT_AMBULATORY_CARE_PROVIDER_SITE_OTHER): Payer: BC Managed Care – PPO | Admitting: Family Medicine

## 2019-09-29 ENCOUNTER — Ambulatory Visit: Payer: BC Managed Care – PPO | Admitting: Sports Medicine

## 2019-09-29 ENCOUNTER — Encounter: Payer: Self-pay | Admitting: Family Medicine

## 2019-09-29 VITALS — BP 122/63 | HR 76 | Temp 97.5°F | Ht 68.0 in | Wt 189.0 lb

## 2019-09-29 DIAGNOSIS — M25512 Pain in left shoulder: Secondary | ICD-10-CM | POA: Diagnosis not present

## 2019-09-29 DIAGNOSIS — N401 Enlarged prostate with lower urinary tract symptoms: Secondary | ICD-10-CM

## 2019-09-29 DIAGNOSIS — M533 Sacrococcygeal disorders, not elsewhere classified: Secondary | ICD-10-CM | POA: Diagnosis not present

## 2019-09-29 DIAGNOSIS — Z0000 Encounter for general adult medical examination without abnormal findings: Secondary | ICD-10-CM | POA: Diagnosis not present

## 2019-09-29 DIAGNOSIS — R350 Frequency of micturition: Secondary | ICD-10-CM

## 2019-09-29 DIAGNOSIS — F5104 Psychophysiologic insomnia: Secondary | ICD-10-CM | POA: Diagnosis not present

## 2019-09-29 DIAGNOSIS — I10 Essential (primary) hypertension: Secondary | ICD-10-CM | POA: Diagnosis not present

## 2019-09-29 DIAGNOSIS — Z13220 Encounter for screening for lipoid disorders: Secondary | ICD-10-CM

## 2019-09-29 LAB — COMPLETE METABOLIC PANEL WITH GFR
AG Ratio: 1.7 (calc) (ref 1.0–2.5)
ALT: 27 U/L (ref 9–46)
AST: 27 U/L (ref 10–35)
Albumin: 4.4 g/dL (ref 3.6–5.1)
Alkaline phosphatase (APISO): 87 U/L (ref 35–144)
BUN: 22 mg/dL (ref 7–25)
CO2: 28 mmol/L (ref 20–32)
Calcium: 9.1 mg/dL (ref 8.6–10.3)
Chloride: 104 mmol/L (ref 98–110)
Creat: 0.97 mg/dL (ref 0.70–1.33)
GFR, Est African American: 102 mL/min/{1.73_m2} (ref >=60)
GFR, Est Non African American: 88 mL/min/{1.73_m2} (ref >=60)
Globulin: 2.6 g/dL (calc) (ref 1.9–3.7)
Glucose, Bld: 98 mg/dL (ref 65–99)
Potassium: 4.1 mmol/L (ref 3.5–5.3)
Sodium: 139 mmol/L (ref 135–146)
Total Bilirubin: 0.5 mg/dL (ref 0.2–1.2)
Total Protein: 7 g/dL (ref 6.1–8.1)

## 2019-09-29 LAB — PSA: PSA: 1.2 ng/mL (ref <=4.0)

## 2019-09-29 LAB — CBC
HCT: 45.1 % (ref 38.5–50.0)
Hemoglobin: 15.1 g/dL (ref 13.2–17.1)
MCH: 27.6 pg (ref 27.0–33.0)
MCHC: 33.5 g/dL (ref 32.0–36.0)
MCV: 82.4 fL (ref 80.0–100.0)
MPV: 10.9 fL (ref 7.5–12.5)
Platelets: 207 10*3/uL (ref 140–400)
RBC: 5.47 10*6/uL (ref 4.20–5.80)
RDW: 13.5 % (ref 11.0–15.0)
WBC: 5.5 10*3/uL (ref 3.8–10.8)

## 2019-09-29 LAB — LIPID PANEL
Cholesterol: 156 mg/dL (ref <200)
HDL: 44 mg/dL (ref >=40)
LDL Cholesterol (Calc): 92 mg/dL (calc)
Non-HDL Cholesterol (Calc): 112 mg/dL (calc) (ref <130)
Total CHOL/HDL Ratio: 3.5 (calc) (ref <5.0)
Triglycerides: 104 mg/dL (ref <150)

## 2019-09-29 MED ORDER — ESZOPICLONE 3 MG PO TABS: 3.0000 mg | ORAL_TABLET | Freq: Every day | ORAL | 0 refills | Status: DC

## 2019-09-29 NOTE — Patient Instructions (Signed)
Preventive Care 41-55 Years Old, Male Preventive care refers to lifestyle choices and visits with your health care provider that can promote health and wellness. This includes:  A yearly physical exam. This is also called an annual well check.  Regular dental and eye exams.  Immunizations.  Screening for certain conditions.  Healthy lifestyle choices, such as eating a healthy diet, getting regular exercise, not using drugs or products that contain nicotine and tobacco, and limiting alcohol use. What can I expect for my preventive care visit? Physical exam Your health care provider will check:  Height and weight. These may be used to calculate body mass index (BMI), which is a measurement that tells if you are at a healthy weight.  Heart rate and blood pressure.  Your skin for abnormal spots. Counseling Your health care provider may ask you questions about:  Alcohol, tobacco, and drug use.  Emotional well-being.  Home and relationship well-being.  Sexual activity.  Eating habits.  Work and work Statistician. What immunizations do I need?  Influenza (flu) vaccine  This is recommended every year. Tetanus, diphtheria, and pertussis (Tdap) vaccine  You may need a Td booster every 10 years. Varicella (chickenpox) vaccine  You may need this vaccine if you have not already been vaccinated. Zoster (shingles) vaccine  You may need this after age 64. Measles, mumps, and rubella (MMR) vaccine  You may need at least one dose of MMR if you were born in 1957 or later. You may also need a second dose. Pneumococcal conjugate (PCV13) vaccine  You may need this if you have certain conditions and were not previously vaccinated. Pneumococcal polysaccharide (PPSV23) vaccine  You may need one or two doses if you smoke cigarettes or if you have certain conditions. Meningococcal conjugate (MenACWY) vaccine  You may need this if you have certain conditions. Hepatitis A  vaccine  You may need this if you have certain conditions or if you travel or work in places where you may be exposed to hepatitis A. Hepatitis B vaccine  You may need this if you have certain conditions or if you travel or work in places where you may be exposed to hepatitis B. Haemophilus influenzae type b (Hib) vaccine  You may need this if you have certain risk factors. Human papillomavirus (HPV) vaccine  If recommended by your health care provider, you may need three doses over 6 months. You may receive vaccines as individual doses or as more than one vaccine together in one shot (combination vaccines). Talk with your health care provider about the risks and benefits of combination vaccines. What tests do I need? Blood tests  Lipid and cholesterol levels. These may be checked every 5 years, or more frequently if you are over 60 years old.  Hepatitis C test.  Hepatitis B test. Screening  Lung cancer screening. You may have this screening every year starting at age 43 if you have a 30-pack-year history of smoking and currently smoke or have quit within the past 15 years.  Prostate cancer screening. Recommendations will vary depending on your family history and other risks.  Colorectal cancer screening. All adults should have this screening starting at age 72 and continuing until age 2. Your health care provider may recommend screening at age 14 if you are at increased risk. You will have tests every 1-10 years, depending on your results and the type of screening test.  Diabetes screening. This is done by checking your blood sugar (glucose) after you have not eaten  for a while (fasting). You may have this done every 1-3 years.  Sexually transmitted disease (STD) testing. Follow these instructions at home: Eating and drinking  Eat a diet that includes fresh fruits and vegetables, whole grains, lean protein, and low-fat dairy products.  Take vitamin and mineral supplements as  recommended by your health care provider.  Do not drink alcohol if your health care provider tells you not to drink.  If you drink alcohol: ? Limit how much you have to 0-2 drinks a day. ? Be aware of how much alcohol is in your drink. In the [place] one drink equals one 12 oz bottle of beer (355 mL), one 5 oz glass of wine (148 mL), or one 1 oz glass of hard liquor (44 mL). Lifestyle  Take daily care of your teeth and gums.  Stay active. Exercise for at least 30 minutes on 5 or more days each week.  Do not use any products that contain nicotine or tobacco, such as cigarettes, e-cigarettes, and chewing tobacco. If you need help quitting, ask your health care provider.  If you are sexually active, practice safe sex. Use a condom or other form of protection to prevent STIs (sexually transmitted infections).  Talk with your health care provider about taking a low-dose aspirin every day starting at age 53. What's next?  Go to your health care provider once a year for a well check visit.  Ask your health care provider how often you should have your eyes and teeth checked.  Stay up to date on all vaccines. This information is not intended to replace advice given to you by your health care provider. Make sure you discuss any questions you have with your health care provider. Document Revised: 08/05/2018 Document Reviewed: 08/05/2018 Elsevier Patient Education  2020 [employer].

## 2019-09-29 NOTE — Assessment & Plan Note (Signed)
Orders Placed This Encounter  Procedures  . COMPLETE METABOLIC PANEL WITH GFR  . CBC  . Lipid Profile  . PSA  Screening: PSA, Lipid. Immunizations: UTD Anticipatory guidance/Risk factor reduction:  Recommend continuation of healthy lifestyle choices.  Additional recommendations per AVS.

## 2019-09-29 NOTE — Assessment & Plan Note (Signed)
Update PSA 

## 2019-09-29 NOTE — Assessment & Plan Note (Signed)
BP well controlled at this time without medication.  Continue healthy lifestyle.

## 2019-09-29 NOTE — Progress Notes (Signed)
Earl [name] - 54 y.o. male MRN [id number]  Date of birth: 06/26/1966  Subjective Chief Complaint  Patient presents with  . 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 male with history of HTN, multiple orthopedic problems/surgeries, anxiety with insomnia,  Migraines and hypogonadism here today for annual exam.  He is having some issues with his back and is followed by orthopedics for this.  He needs refill of his lunesta today.  This is working well for him.  He denies side effects related to this.   Endocrinology is managing his testosterone.   He does exercise frequently and feels like he eats pretty healthy.   He is a non smoker and denies EtOH use at this time.   Review of Systems  Constitutional: Negative for chills, fever, malaise/fatigue and weight loss.  HENT: Negative for congestion, ear pain and sore throat.   Eyes: Negative for blurred vision, double vision and pain.  Respiratory: Negative for cough and shortness of breath.   Cardiovascular: Negative for chest pain and palpitations.  Gastrointestinal: Negative for abdominal pain, blood in stool, constipation, heartburn and nausea.  Genitourinary: Negative for dysuria and urgency.  Musculoskeletal: Negative for joint pain and myalgias.  Neurological: Negative for dizziness and headaches.  Endo/Heme/Allergies: Does not bruise/bleed easily.  Psychiatric/Behavioral: Negative for depression. The patient is not nervous/anxious and does not have insomnia.     Allergies  Allergen Reactions  . Iodinated Diagnostic Agents Swelling    N/V - rash- red skin   . Morphine And Related Other (See Comments)    Irritability   . Temazepam Other (See Comments)    Mood change, irritability    Past Medical History:  Diagnosis Date  . Abscess of nasal cavity 12/14/2018  . Acute urinary retention   . Arthritis   . Cellulitis of sidewall of nose 12/02/2018  .  Depression   . Hemorrhoids   . History of skin cancer in adulthood 2012   chest wall  . Hypertension   . Hypogonadism, male   . Insomnia     Past Surgical History:  Procedure Laterality Date  . APPENDECTOMY    . COLONOSCOPY    . HERNIA REPAIR    . JOINT REPLACEMENT    . OSTEOTOMY    . SHOULDER ARTHROSCOPY Left 06/2018  . TOTAL HIP ARTHROPLASTY Right   . TOTAL KNEE ARTHROPLASTY Left     Social History   Socioeconomic History  . Marital status: Divorced    Spouse name: Not on file  . Number of children: Not on file  . Years of education: Not on file  . Highest education level: Not on file  Occupational History  . Not on file  Tobacco Use  . Smoking status: Never Smoker  . Smokeless tobacco: Never Used  Substance and Sexual Activity  . Alcohol use: Not Currently  . Drug use: Never  . Sexual activity: Yes    Birth control/protection: None  Other Topics Concern  . Not on file  Social History Narrative  . Not on file   Social Determinants of Health   Financial Resource Strain:   . Difficulty of Paying Living Expenses: Not on file  Food Insecurity:   . Worried About Charity fundraiser in the Last Year: Not on file  . Ran Out of Food in the Last Year: Not on file  Transportation Needs:   .  Lack of Transportation (Medical): Not on file  . Lack of Transportation (Non-Medical): Not on file  Physical Activity:   . Days of Exercise per Week: Not on file  . Minutes of Exercise per Session: Not on file  Stress:   . Feeling of Stress : Not on file  Social Connections:   . Frequency of Communication with Friends and Family: Not on file  . Frequency of Social Gatherings with Friends and Family: Not on file  . Attends Religious Services: Not on file  . Active Member of Clubs or Organizations: Not on file  . Attends Banker Meetings: Not on file  . Marital Status: Not on file    Family History  Problem Relation Age of Onset  . Stroke Mother   . Skin  cancer Father   . Heart disease Father   . Hypertension Father   . Aneurysm Brother   . Hypertension Sister   . Heart disease Sister   . Cancer Neg Hx   . Heart attack Neg Hx     Health Maintenance  Topic Date Due  . TETANUS/TDAP  09/17/2026  . COLONOSCOPY  07/16/2027  . INFLUENZA VACCINE  Completed  . HIV Screening  Completed    ----------------------------------------------------------------------------------------------------------------------------------------------------------------------------------------------------------------- Physical Exam BP 122/63   Pulse 76   Temp (!) 97.5 F (36.4 C) (Oral)   Ht 5\' 8"  (1.727 m)   Wt 189 lb (85.7 kg)   SpO2 100%   BMI 28.74 kg/m   Physical Exam Constitutional:      General: He is not in acute distress. HENT:     Head: Normocephalic and atraumatic.     Right Ear: Tympanic membrane and external ear normal.     Left Ear: Tympanic membrane and external ear normal.     Mouth/Throat:     Mouth: Mucous membranes are moist.  Eyes:     General: No scleral icterus. Neck:     Thyroid: No thyromegaly.  Cardiovascular:     Rate and Rhythm: Normal rate and regular rhythm.     Heart sounds: Normal heart sounds.  Pulmonary:     Effort: Pulmonary effort is normal.     Breath sounds: Normal breath sounds.  Abdominal:     General: Bowel sounds are normal. There is no distension.     Palpations: Abdomen is soft.     Tenderness: There is no abdominal tenderness. There is no guarding.  Musculoskeletal:     Cervical back: Normal range of motion.  Lymphadenopathy:     Cervical: No cervical adenopathy.  Skin:    General: Skin is warm and dry.     Findings: No rash.  Neurological:     General: No focal deficit present.     Mental Status: He is alert and oriented to person, place, and time.     Cranial Nerves: No cranial nerve deficit.     Motor: No abnormal muscle tone.  Psychiatric:        Mood and Affect: Mood normal.         Behavior: Behavior normal.     ------------------------------------------------------------------------------------------------------------------------------------------------------------------------------------------------------------------- Assessment and Plan  Benign prostatic hyperplasia with urinary frequency Update PSA  Hypertension goal BP (blood pressure) < 130/80 BP well controlled at this time without medication.  Continue healthy lifestyle.   Chronic insomnia Stable, well controlled with lunesta.  Refilled.  PDMP reviewed  Well adult exam Orders Placed This Encounter  Procedures  . COMPLETE METABOLIC PANEL WITH GFR  . CBC  .  Lipid Profile  . PSA  Screening: PSA, Lipid. Immunizations: UTD Anticipatory guidance/Risk factor reduction:  Recommend continuation of healthy lifestyle choices.  Additional recommendations per AVS.     This visit occurred during the SARS-CoV-2 public health emergency.  Safety protocols were in place, including screening questions prior to the visit, additional usage of staff PPE, and extensive cleaning of exam room while observing appropriate contact time as indicated for disinfecting solutions.

## 2019-09-29 NOTE — Assessment & Plan Note (Addendum)
Stable, well controlled with lunesta.  Refilled.  PDMP reviewed

## 2019-10-20 DIAGNOSIS — M25512 Pain in left shoulder: Secondary | ICD-10-CM | POA: Diagnosis not present

## 2019-10-20 DIAGNOSIS — M533 Sacrococcygeal disorders, not elsewhere classified: Secondary | ICD-10-CM | POA: Diagnosis not present

## 2019-10-20 DIAGNOSIS — M19012 Primary osteoarthritis, left shoulder: Secondary | ICD-10-CM | POA: Diagnosis not present

## 2019-11-09 ENCOUNTER — Other Ambulatory Visit: Payer: Self-pay | Admitting: Osteopathic Medicine

## 2019-11-09 DIAGNOSIS — N529 Male erectile dysfunction, unspecified: Secondary | ICD-10-CM

## 2019-11-10 DIAGNOSIS — M19012 Primary osteoarthritis, left shoulder: Secondary | ICD-10-CM | POA: Diagnosis not present

## 2019-11-10 DIAGNOSIS — M533 Sacrococcygeal disorders, not elsewhere classified: Secondary | ICD-10-CM | POA: Diagnosis not present

## 2019-11-22 ENCOUNTER — Encounter: Payer: Self-pay | Admitting: Family Medicine

## 2019-11-24 ENCOUNTER — Other Ambulatory Visit: Payer: Self-pay | Admitting: Family Medicine

## 2019-11-24 ENCOUNTER — Other Ambulatory Visit: Payer: Self-pay | Admitting: *Deleted

## 2019-11-24 ENCOUNTER — Other Ambulatory Visit: Payer: Self-pay | Admitting: Osteopathic Medicine

## 2019-11-24 DIAGNOSIS — N529 Male erectile dysfunction, unspecified: Secondary | ICD-10-CM

## 2019-12-09 DIAGNOSIS — E349 Endocrine disorder, unspecified: Secondary | ICD-10-CM | POA: Diagnosis not present

## 2019-12-14 DIAGNOSIS — M19012 Primary osteoarthritis, left shoulder: Secondary | ICD-10-CM | POA: Diagnosis not present

## 2019-12-14 DIAGNOSIS — M533 Sacrococcygeal disorders, not elsewhere classified: Secondary | ICD-10-CM | POA: Diagnosis not present

## 2019-12-26 ENCOUNTER — Other Ambulatory Visit: Payer: Self-pay | Admitting: Family Medicine

## 2019-12-26 DIAGNOSIS — F5104 Psychophysiologic insomnia: Secondary | ICD-10-CM

## 2019-12-26 MED ORDER — ESZOPICLONE 3 MG PO TABS: 3.0000 mg | ORAL_TABLET | Freq: Every day | ORAL | 1 refills | Status: DC

## 2020-01-11 DIAGNOSIS — Z9109 Other allergy status, other than to drugs and biological substances: Secondary | ICD-10-CM | POA: Diagnosis not present

## 2020-01-11 DIAGNOSIS — R140 Abdominal distension (gaseous): Secondary | ICD-10-CM | POA: Diagnosis not present

## 2020-01-11 DIAGNOSIS — M533 Sacrococcygeal disorders, not elsewhere classified: Secondary | ICD-10-CM | POA: Diagnosis not present

## 2020-01-11 DIAGNOSIS — M19012 Primary osteoarthritis, left shoulder: Secondary | ICD-10-CM | POA: Diagnosis not present

## 2020-01-20 ENCOUNTER — Other Ambulatory Visit: Payer: Self-pay | Admitting: Family Medicine

## 2020-01-20 DIAGNOSIS — N529 Male erectile dysfunction, unspecified: Secondary | ICD-10-CM

## 2020-01-20 NOTE — Telephone Encounter (Signed)
WK-Plz see refill req/thx f

## 2020-02-08 DIAGNOSIS — M533 Sacrococcygeal disorders, not elsewhere classified: Secondary | ICD-10-CM | POA: Diagnosis not present

## 2020-02-08 DIAGNOSIS — Z9109 Other allergy status, other than to drugs and biological substances: Secondary | ICD-10-CM | POA: Diagnosis not present

## 2020-02-08 DIAGNOSIS — R140 Abdominal distension (gaseous): Secondary | ICD-10-CM | POA: Diagnosis not present

## 2020-02-08 DIAGNOSIS — M19012 Primary osteoarthritis, left shoulder: Secondary | ICD-10-CM | POA: Diagnosis not present

## 2020-02-19 ENCOUNTER — Other Ambulatory Visit: Payer: Self-pay | Admitting: Family Medicine

## 2020-02-19 DIAGNOSIS — N529 Male erectile dysfunction, unspecified: Secondary | ICD-10-CM

## 2020-02-21 NOTE — Telephone Encounter (Signed)
Spoke with patient who states that he does not need this refill and he is currently still a patient of Dr. Ashley Royalty. Called pharmacy to cancel refill and Dr. Doreene Burke as PSP

## 2020-03-07 DIAGNOSIS — E349 Endocrine disorder, unspecified: Secondary | ICD-10-CM | POA: Diagnosis not present

## 2020-03-07 DIAGNOSIS — M533 Sacrococcygeal disorders, not elsewhere classified: Secondary | ICD-10-CM | POA: Diagnosis not present

## 2020-03-07 DIAGNOSIS — R140 Abdominal distension (gaseous): Secondary | ICD-10-CM | POA: Diagnosis not present

## 2020-03-07 DIAGNOSIS — M19012 Primary osteoarthritis, left shoulder: Secondary | ICD-10-CM | POA: Diagnosis not present

## 2020-03-07 DIAGNOSIS — J310 Chronic rhinitis: Secondary | ICD-10-CM | POA: Diagnosis not present

## 2020-03-29 ENCOUNTER — Other Ambulatory Visit: Payer: Self-pay | Admitting: Physician Assistant

## 2020-03-29 DIAGNOSIS — G43019 Migraine without aura, intractable, without status migrainosus: Secondary | ICD-10-CM

## 2020-04-03 NOTE — Telephone Encounter (Signed)
To pcp

## 2020-04-04 DIAGNOSIS — J310 Chronic rhinitis: Secondary | ICD-10-CM | POA: Diagnosis not present

## 2020-04-04 DIAGNOSIS — M19012 Primary osteoarthritis, left shoulder: Secondary | ICD-10-CM | POA: Diagnosis not present

## 2020-04-04 DIAGNOSIS — M533 Sacrococcygeal disorders, not elsewhere classified: Secondary | ICD-10-CM | POA: Diagnosis not present

## 2020-04-04 DIAGNOSIS — Z9109 Other allergy status, other than to drugs and biological substances: Secondary | ICD-10-CM | POA: Diagnosis not present

## 2020-04-06 DIAGNOSIS — B349 Viral infection, unspecified: Secondary | ICD-10-CM | POA: Diagnosis not present

## 2020-04-06 DIAGNOSIS — J029 Acute pharyngitis, unspecified: Secondary | ICD-10-CM | POA: Diagnosis not present

## 2020-04-06 DIAGNOSIS — Z719 Counseling, unspecified: Secondary | ICD-10-CM | POA: Diagnosis not present

## 2020-04-06 DIAGNOSIS — Z20822 Contact with and (suspected) exposure to covid-19: Secondary | ICD-10-CM | POA: Diagnosis not present

## 2020-04-06 DIAGNOSIS — M791 Myalgia, unspecified site: Secondary | ICD-10-CM | POA: Diagnosis not present

## 2020-04-06 DIAGNOSIS — J208 Acute bronchitis due to other specified organisms: Secondary | ICD-10-CM | POA: Diagnosis not present

## 2020-04-06 DIAGNOSIS — R05 Cough: Secondary | ICD-10-CM | POA: Diagnosis not present

## 2020-04-19 DIAGNOSIS — H00012 Hordeolum externum right lower eyelid: Secondary | ICD-10-CM | POA: Diagnosis not present

## 2020-04-19 DIAGNOSIS — H5789 Other specified disorders of eye and adnexa: Secondary | ICD-10-CM | POA: Diagnosis not present

## 2020-04-27 DIAGNOSIS — H00012 Hordeolum externum right lower eyelid: Secondary | ICD-10-CM | POA: Diagnosis not present

## 2020-05-02 DIAGNOSIS — M533 Sacrococcygeal disorders, not elsewhere classified: Secondary | ICD-10-CM | POA: Diagnosis not present

## 2020-05-02 DIAGNOSIS — Z79899 Other long term (current) drug therapy: Secondary | ICD-10-CM | POA: Diagnosis not present

## 2020-05-02 DIAGNOSIS — J310 Chronic rhinitis: Secondary | ICD-10-CM | POA: Diagnosis not present

## 2020-05-02 DIAGNOSIS — Z1159 Encounter for screening for other viral diseases: Secondary | ICD-10-CM | POA: Diagnosis not present

## 2020-05-02 DIAGNOSIS — Z9109 Other allergy status, other than to drugs and biological substances: Secondary | ICD-10-CM | POA: Diagnosis not present

## 2020-05-02 DIAGNOSIS — E7800 Pure hypercholesterolemia, unspecified: Secondary | ICD-10-CM | POA: Diagnosis not present

## 2020-05-02 DIAGNOSIS — M19012 Primary osteoarthritis, left shoulder: Secondary | ICD-10-CM | POA: Diagnosis not present

## 2020-05-02 DIAGNOSIS — Z20822 Contact with and (suspected) exposure to covid-19: Secondary | ICD-10-CM | POA: Diagnosis not present

## 2020-05-25 DIAGNOSIS — E349 Endocrine disorder, unspecified: Secondary | ICD-10-CM | POA: Diagnosis not present

## 2020-05-31 DIAGNOSIS — M533 Sacrococcygeal disorders, not elsewhere classified: Secondary | ICD-10-CM | POA: Diagnosis not present

## 2020-05-31 DIAGNOSIS — M19012 Primary osteoarthritis, left shoulder: Secondary | ICD-10-CM | POA: Diagnosis not present

## 2020-05-31 DIAGNOSIS — G43909 Migraine, unspecified, not intractable, without status migrainosus: Secondary | ICD-10-CM | POA: Diagnosis not present

## 2020-05-31 DIAGNOSIS — J310 Chronic rhinitis: Secondary | ICD-10-CM | POA: Diagnosis not present

## 2020-06-01 DIAGNOSIS — E349 Endocrine disorder, unspecified: Secondary | ICD-10-CM | POA: Diagnosis not present

## 2020-06-20 DIAGNOSIS — D485 Neoplasm of uncertain behavior of skin: Secondary | ICD-10-CM | POA: Diagnosis not present

## 2020-06-20 DIAGNOSIS — Z1283 Encounter for screening for malignant neoplasm of skin: Secondary | ICD-10-CM | POA: Diagnosis not present

## 2020-07-10 DIAGNOSIS — G4700 Insomnia, unspecified: Secondary | ICD-10-CM | POA: Diagnosis not present

## 2020-07-10 DIAGNOSIS — M533 Sacrococcygeal disorders, not elsewhere classified: Secondary | ICD-10-CM | POA: Diagnosis not present

## 2020-07-10 DIAGNOSIS — M19012 Primary osteoarthritis, left shoulder: Secondary | ICD-10-CM | POA: Diagnosis not present

## 2020-07-10 DIAGNOSIS — G43909 Migraine, unspecified, not intractable, without status migrainosus: Secondary | ICD-10-CM | POA: Diagnosis not present

## 2020-07-29 ENCOUNTER — Other Ambulatory Visit: Payer: Self-pay

## 2020-07-29 ENCOUNTER — Emergency Department
Admission: EM | Admit: 2020-07-29 | Discharge: 2020-07-29 | Disposition: A | Discharge status: Home / Self Care | Payer: BC Managed Care – PPO | Source: Home / Self Care

## 2020-07-29 DIAGNOSIS — K529 Noninfective gastroenteritis and colitis, unspecified: Secondary | ICD-10-CM

## 2020-07-29 MED ORDER — DIPHENOXYLATE-ATROPINE 2.5-0.025 MG PO TABS: 1.0000 | ORAL_TABLET | Freq: Four times a day (QID) | ORAL | 0 refills | Status: DC | PRN

## 2020-07-29 MED ORDER — FAMOTIDINE 20 MG PO TABS: 20.0000 mg | ORAL_TABLET | Freq: Two times a day (BID) | ORAL | 0 refills | Status: DC | PRN

## 2020-07-29 MED ORDER — ONDANSETRON 8 MG PO TBDP: 8.0000 mg | ORAL_TABLET | Freq: Three times a day (TID) | ORAL | 0 refills | Status: DC | PRN

## 2020-07-29 MED ORDER — DICYCLOMINE HCL 20 MG PO TABS: 20.0000 mg | ORAL_TABLET | Freq: Two times a day (BID) | ORAL | 0 refills | Status: DC | PRN

## 2020-07-29 NOTE — Discharge Instructions (Signed)
Take medication as prescribed.  Symptoms should resolve within the next 24 to 48 hours.  If your abdominal pain worsens or you continue to be intolerant of fluids and food within 48 hours I will recommend going immediately to the emergency department for advanced work-up and evaluation. Your respiratory panel including flu and Covid test will result within 48 to 72 hours.

## 2020-07-29 NOTE — ED Triage Notes (Signed)
Patient presents to Urgent Care with complaints of abdominal pain, diarrhea, and vomiting since about 2 days ago. Patient reports he was competing in a body builidng competition and cooked some chicken the other night in preparation for the competition. Diarrhea and vomiting continued into yesterday, ate some yogurt and that seemed to help but it is bad again today. Thinks he is very dehdrated, has been able to keep liquids down but the diarrhea continues.

## 2020-07-29 NOTE — ED Provider Notes (Signed)
[name]    CSN: [id number] Arrival date & time: 07/29/20  1035      History   Chief Complaint Chief Complaint  Patient presents with  . Abdominal Pain  . Diarrhea    HPI Earl [name] is a 54 y.o. male.   HPI  Patient presents today with abdominal cramping, diarrhea, chills x3 days.  Patient has been training for a weight training event and endorses that he has changed his diet did not cough a few excess calories.  He ate some chicken prior to the onset of the nausea and vomiting episode.  And feels that the chicken may have been the source of the illness.  He has had intermittent vomitus x3 days.  He has been able to tolerate fluids however is unable to tolerate any type regular food over the last 24 hours.  He did eat some yogurt and was able to tolerate that without vomitus.  He is unaware of any sick contacts however he works in a [employer] and recently attended this [employer] training event.  He is fully vaccinated against Covid.  He denies any severe sharp or stabbing pain.  He endorses audible bowel sounds.  Past Medical History:  Diagnosis Date  . Abscess of nasal cavity 12/14/2018  . Acute urinary retention   . Arthritis   . Cellulitis of sidewall of nose 12/02/2018  . Depression   . Hemorrhoids   . History of skin cancer in adulthood 2012   chest wall  . Hypertension   . Hypogonadism, male   . Insomnia     Patient Active Problem List   Diagnosis Date Noted  . Well adult exam 09/29/2019  . Left leg pain 07/01/2019  . History of methicillin resistant staphylococcus aureus (MRSA) 03/07/2019  . MRSA (methicillin resistant Staphylococcus aureus) infection 12/09/2018  . Facial cellulitis 12/04/2018  . Sebaceous cyst 11/19/2018  . Thrombocytopenia (HCC) 10/28/2018  . Dry mouth 10/28/2018  . Status post left rotator cuff repair 05/07/2018  . History of arthroplasty of left knee 03/03/2018  . Hypertension goal BP (blood pressure) < 130/80 02/26/2018  .  Generalized edema 02/26/2018  . Erectile dysfunction 02/26/2018  . Migraine without aura and without status migrainosus, not intractable 02/26/2018  . Elevated AST (SGOT) 02/26/2018  . Chronic midline low back pain without sciatica 01/06/2018  . Benign prostatic hyperplasia with urinary frequency 08/28/2017  . Recurrent major depressive disorder, in partial remission (HCC) 07/21/2017  . Methadone use disorder, mild, abuse (HCC) 04/29/2017  . Metatarsalgia of left foot 04/02/2017  . Chronic insomnia 01/22/2017  . Loose total hip arthroplasty (HCC) 12/31/2016  . Mood disorder with depressive features due to medical condition 11/06/2015  . Post right SI joint fusion 11/06/2015  . Low testosterone 07/25/2014  . Osteoarthritis of hip 07/07/2013  . Chronic pain of right knee 08/10/2008  . Eczema 02/16/2007    Past Surgical History:  Procedure Laterality Date  . APPENDECTOMY    . COLONOSCOPY    . HERNIA REPAIR    . JOINT REPLACEMENT    . OSTEOTOMY    . SHOULDER ARTHROSCOPY Left 06/2018  . TOTAL HIP ARTHROPLASTY Right   . TOTAL KNEE ARTHROPLASTY Left        Home Medications    Prior to Admission medications   Medication Sig Start Date End Date Taking? Authorizing Provider  eletriptan (RELPAX) 40 MG tablet TAKE 1 TABLET (40 MG TOTAL) BY MOUTH AS NEEDED FOR MIGRAINE OR HEADACHE. MAY REPEAT 1  ADD'L DOSE IN 2 HOURS IF MIGRAINE PERSIST/RECURS. MAX DOSE 80 MG/24HR 08/17/19   Breeback, Jade L, PA-C  Eszopiclone (ESZOPICLONE) 3 MG TABS Take 1 tablet (3 mg total) by mouth at bedtime. 12/26/19 12/25/20  Everrett Coombe, DO  Multiple Vitamins-Minerals (MULTIVITAL PO) Take 1 tablet by mouth daily.     [provider]  sildenafil (REVATIO) 20 MG tablet TAKE 2-5 TAB AS NEEDED 1 HR PRIOR TO SEXUAL ACTIVITY. MAX 100 MG/DAY 02/20/20   Mliss Sax, MD  testosterone cypionate (DEPOTESTOSTERONE CYPIONATE) 200 MG/ML injection Inject 100 mg into the muscle every 7 (seven) days.  11/02/17    [provider]    Family History Family History  Problem Relation Age of Onset  . Stroke Mother   . Skin cancer Father   . Heart disease Father   . Hypertension Father   . Aneurysm Brother   . Hypertension Sister   . Heart disease Sister   . Cancer Neg Hx   . Heart attack Neg Hx     Social History Social History   Tobacco Use  . Smoking status: Never Smoker  . Smokeless tobacco: Never Used  Vaping Use  . Vaping Use: Never used  Substance Use Topics  . Alcohol use: Not Currently  . Drug use: Never     Allergies   Iodinated diagnostic agents, Morphine and related, and Temazepam   Review of Systems Review of Systems Pertinent negatives listed in HPI   Physical Exam Triage Vital Signs ED Triage Vitals  Enc Vitals Group     BP      Pulse      Resp      Temp      Temp src      SpO2      Weight      Height      Head Circumference      Peak Flow      Pain Score      Pain Loc      Pain Edu?      Excl. in GC?    No data found.  Updated Vital Signs There were no vitals taken for this visit.  Visual Acuity Right Eye Distance:   Left Eye Distance:   Bilateral Distance:    Right Eye Near:   Left Eye Near:    Bilateral Near:     Physical Exam General appearance: alert, acutely ill appearing, cooperative and in no distress Head: Normocephalic, without obvious abnormality, atraumatic Respiratory: Respirations even and unlabored, normal respiratory rate Heart: rate and rhythm normal. No gallop or murmurs noted on exam  Abdomen: BS-Hyperactive, no distention, no rebound tenderness, or no mass Extremities: No gross deformities Skin: Skin color, texture, turgor normal. No rashes seen  Psych: Appropriate mood and affect. Neurologic: Mental status: Alert, oriented to person, place, and time, thought content appropriate.   UC Treatments / Results  Labs (all labs ordered are listed, but only abnormal results are displayed) Labs Reviewed - No  data to display  EKG   Radiology No results found.  Procedures Procedures (including critical care time)  Medications Ordered in UC Medications - No data to display  Initial Impression / Assessment and Plan / UC Course  I have reviewed the triage vital signs and the nursing notes.  Pertinent labs & imaging results that were available during my care of the patient were reviewed by me and considered in my medical decision making (see chart for details).  Treating for acute gastroenteritis.  Treatment per discharge medications. Respiratory panel pending.  Red flag precautions given if abdominal symptoms worsen or do not improve. Final Clinical Impressions(s) / UC Diagnoses   Final diagnoses:  Gastroenteritis     Discharge Instructions     Take medication as prescribed.  Symptoms should resolve within the next 24 to 48 hours.  If your abdominal pain worsens or you continue to be intolerant of fluids and food within 48 hours I will recommend going immediately to the emergency department for advanced work-up and evaluation. Your respiratory panel including flu and Covid test will result within 48 to 72 hours.    ED Prescriptions    Medication Sig Dispense Auth. Provider   ondansetron (ZOFRAN-ODT) 8 MG disintegrating tablet Take 1 tablet (8 mg total) by mouth every 8 (eight) hours as needed for nausea. 30 tablet Bing Neighbors, FNP   diphenoxylate-atropine (LOMOTIL) 2.5-0.025 MG tablet Take 1-2 tablets by mouth 4 (four) times daily as needed for diarrhea or loose stools. 20 tablet Bing Neighbors, FNP   dicyclomine (BENTYL) 20 MG tablet Take 1 tablet (20 mg total) by mouth 2 (two) times daily as needed for spasms (Take for abdominal cramping). 14 tablet Bing Neighbors, FNP   famotidine (PEPCID) 20 MG tablet Take 1 tablet (20 mg total) by mouth 2 (two) times daily as needed for heartburn or indigestion. 12 tablet Bing Neighbors, FNP     PDMP not reviewed this  encounter.   Bing Neighbors, [place] 08/03/20 [phone]

## 2020-07-31 LAB — COVID-19, FLU A+B AND RSV
Influenza A, NAA: NOT DETECTED
Influenza B, NAA: NOT DETECTED
RSV, NAA: NOT DETECTED
SARS-CoV-2, NAA: NOT DETECTED

## 2020-08-02 ENCOUNTER — Ambulatory Visit: Payer: BC Managed Care – PPO | Admitting: Family Medicine

## 2020-08-02 ENCOUNTER — Other Ambulatory Visit: Payer: Self-pay

## 2020-08-02 ENCOUNTER — Encounter: Payer: Self-pay | Admitting: Family Medicine

## 2020-08-02 ENCOUNTER — Ambulatory Visit (INDEPENDENT_AMBULATORY_CARE_PROVIDER_SITE_OTHER): Payer: BC Managed Care – PPO

## 2020-08-02 VITALS — BP 138/71 | HR 72 | Wt 168.5 lb

## 2020-08-02 DIAGNOSIS — M898X6 Other specified disorders of bone, lower leg: Secondary | ICD-10-CM

## 2020-08-02 DIAGNOSIS — N529 Male erectile dysfunction, unspecified: Secondary | ICD-10-CM

## 2020-08-02 DIAGNOSIS — F5104 Psychophysiologic insomnia: Secondary | ICD-10-CM | POA: Diagnosis not present

## 2020-08-02 DIAGNOSIS — R2241 Localized swelling, mass and lump, right lower limb: Secondary | ICD-10-CM

## 2020-08-02 DIAGNOSIS — S8991XA Unspecified injury of right lower leg, initial encounter: Secondary | ICD-10-CM | POA: Diagnosis not present

## 2020-08-02 DIAGNOSIS — M7989 Other specified soft tissue disorders: Secondary | ICD-10-CM | POA: Diagnosis not present

## 2020-08-02 MED ORDER — ZOLPIDEM TARTRATE ER 12.5 MG PO TBCR: 12.5000 mg | EXTENDED_RELEASE_TABLET | Freq: Every evening | ORAL | 3 refills | Status: DC | PRN

## 2020-08-02 MED ORDER — SILDENAFIL CITRATE 100 MG PO TABS
50.0000 mg | ORAL_TABLET | Freq: Every day | ORAL | 6 refills | Status: AC | PRN
Start: 2020-08-02 — End: ?

## 2020-08-02 NOTE — Assessment & Plan Note (Signed)
Prominent screw head along right proximal tibia.  X-rays ordered for further evaluation.  Discussed that he may need referral to orthopedics as well for evaluation of possible screw loosening.

## 2020-08-02 NOTE — Assessment & Plan Note (Signed)
He does not feel that Earl [name] is as effective as it has been previously.  Will change to Ambien CR at 12.5 mg each night.

## 2020-08-02 NOTE — Assessment & Plan Note (Signed)
Sildenafil renewed

## 2020-08-02 NOTE — Progress Notes (Signed)
Earl [name] - 54 y.o. male MRN [id number]  Date of birth: Aug 14, 1966  Subjective No chief complaint on file.   HPI Earl [name] is a 54 y.o. male here today with complaint of right leg problem.  Previous fracture of right tibia with hardware.  He reports that over the past couple weeks that hardware seems to be more prominent along the proximal tibia.  He does have mild tenderness overlying this area but no significant pain.  There has been no warmth or redness surrounding this area.  He also has continued problems with insomnia.  He has been on Lunesta for quite some time but does not feel like this is effective at allowing him to stay asleep.  He did try Ambien CR 6.25 mg previously.  This was helpful however he felt like strength was not quite enough.  He would like to try this in a higher strength.  He also needs refill of sildenafil.  ROS:  A comprehensive ROS was completed and negative except as noted per HPI  Allergies  Allergen Reactions  . Iodinated Diagnostic Agents Swelling    N/V - rash- red skin   . Morphine And Related Other (See Comments)    Irritability   . Temazepam Other (See Comments)    Mood change, irritability  . Tomato Other (See Comments) and Nausea And Vomiting    Tomato based products give indigestion. Tomato based products give indigestion.     Past Medical History:  Diagnosis Date  . Abscess of nasal cavity 12/14/2018  . Acute urinary retention   . Arthritis   . Cellulitis of sidewall of nose 12/02/2018  . Depression   . Hemorrhoids   . History of skin cancer in adulthood 2012   chest wall  . Hypertension   . Hypogonadism, male   . Insomnia     Past Surgical History:  Procedure Laterality Date  . APPENDECTOMY    . COLONOSCOPY    . HERNIA REPAIR    . JOINT REPLACEMENT    . OSTEOTOMY    . SHOULDER ARTHROSCOPY Left 06/2018  . TOTAL HIP ARTHROPLASTY Right   . TOTAL KNEE ARTHROPLASTY Left     Social History   Socioeconomic History   . Marital status: Divorced    Spouse name: Not on file  . Number of children: Not on file  . Years of education: Not on file  . Highest education level: Not on file  Occupational History  . Not on file  Tobacco Use  . Smoking status: Never Smoker  . Smokeless tobacco: Never Used  Vaping Use  . Vaping Use: Never used  Substance and Sexual Activity  . Alcohol use: Not Currently  . Drug use: Never  . Sexual activity: Yes    Birth control/protection: None  Other Topics Concern  . Not on file  Social History Narrative  . Not on file   Social Determinants of Health   Financial Resource Strain: Not on file  Food Insecurity: Not on file  Transportation Needs: Not on file  Physical Activity: Not on file  Stress: Not on file  Social Connections: Not on file    Family History  Problem Relation Age of Onset  . Stroke Mother   . Skin cancer Father   . Heart disease Father   . Hypertension Father   . Aneurysm Brother   . Hypertension Sister   . Heart disease Sister   . Cancer Neg Hx   . Heart attack Neg  Hx     Health Maintenance  Topic Date Due  . Hepatitis C Screening  Never done  . INFLUENZA VACCINE  03/25/2020  . TETANUS/TDAP  09/17/2026  . COLONOSCOPY  07/16/2027  . HIV Screening  Completed     ----------------------------------------------------------------------------------------------------------------------------------------------------------------------------------------------------------------- Physical Exam BP 138/71 (BP Location: Left Arm, Patient Position: Sitting, Cuff Size: Large)   Pulse 72   Wt 168 lb 8 oz (76.4 kg)   SpO2 99%   BMI 25.62 kg/m   Physical Exam Constitutional:      Appearance: Normal appearance.  Musculoskeletal:     Comments: Prominent screw head along medial, proximal tibia.  There is no significant tenderness overlying this.  Neurological:     Mental Status: He is alert.      ------------------------------------------------------------------------------------------------------------------------------------------------------------------------------------------------------------------- Assessment and Plan  Pain in tibia Prominent screw head along right proximal tibia.  X-rays ordered for further evaluation.  Discussed that he may need referral to orthopedics as well for evaluation of possible screw loosening.  Chronic insomnia He does not feel that Alfonso Patten is as effective as it has been previously.  Will change to Ambien CR at 12.5 mg each night.  Erectile dysfunction Sildenafil renewed.   Meds ordered this encounter  Medications  . zolpidem (AMBIEN CR) 12.5 MG CR tablet    Sig: Take 1 tablet (12.5 mg total) by mouth at bedtime as needed for sleep.    Dispense:  30 tablet    Refill:  3  . sildenafil (VIAGRA) 100 MG tablet    Sig: Take 0.5-1 tablets (50-100 mg total) by mouth daily as needed for erectile dysfunction.    Dispense:  30 tablet    Refill:  6    No follow-ups on file.    This visit occurred during the SARS-CoV-2 public health emergency.  Safety protocols were in place, including screening questions prior to the visit, additional usage of staff PPE, and extensive cleaning of exam room while observing appropriate contact time as indicated for disinfecting solutions.

## 2020-08-03 ENCOUNTER — Ambulatory Visit: Payer: BC Managed Care – PPO | Admitting: Sports Medicine

## 2020-08-06 DIAGNOSIS — M545 Low back pain, unspecified: Secondary | ICD-10-CM | POA: Diagnosis not present

## 2020-08-06 DIAGNOSIS — G8929 Other chronic pain: Secondary | ICD-10-CM

## 2020-08-07 DIAGNOSIS — C44519 Basal cell carcinoma of skin of other part of trunk: Secondary | ICD-10-CM | POA: Diagnosis not present

## 2020-08-07 DIAGNOSIS — L570 Actinic keratosis: Secondary | ICD-10-CM | POA: Diagnosis not present

## 2020-08-07 DIAGNOSIS — Z85828 Personal history of other malignant neoplasm of skin: Secondary | ICD-10-CM | POA: Diagnosis not present

## 2020-08-07 DIAGNOSIS — C44219 Basal cell carcinoma of skin of left ear and external auricular canal: Secondary | ICD-10-CM | POA: Diagnosis not present

## 2020-08-07 DIAGNOSIS — L814 Other melanin hyperpigmentation: Secondary | ICD-10-CM | POA: Diagnosis not present

## 2020-08-07 DIAGNOSIS — D485 Neoplasm of uncertain behavior of skin: Secondary | ICD-10-CM | POA: Diagnosis not present

## 2020-08-07 NOTE — Telephone Encounter (Signed)
I have not even seen him for this, we will not be managing this over a MyChart message.

## 2020-08-07 NOTE — Telephone Encounter (Signed)
MyChart encounter.

## 2020-08-14 ENCOUNTER — Other Ambulatory Visit: Payer: Self-pay

## 2020-08-14 ENCOUNTER — Ambulatory Visit: Payer: BC Managed Care – PPO | Admitting: Sports Medicine

## 2020-08-14 DIAGNOSIS — M545 Low back pain, unspecified: Secondary | ICD-10-CM | POA: Diagnosis not present

## 2020-08-14 MED ORDER — NAPROXEN 500 MG PO TABS: 500.0000 mg | ORAL_TABLET | Freq: Two times a day (BID) | ORAL | 3 refills | Status: DC

## 2020-08-14 MED ORDER — CYCLOBENZAPRINE HCL 10 MG PO TABS: ORAL_TABLET | ORAL | 0 refills | Status: DC

## 2020-08-14 NOTE — Progress Notes (Signed)
    Procedures performed today:    None.  Independent interpretation of notes and tests performed by another provider:   X-rays personally reviewed from 08/06/2020, previous right SI joint fusion appears intact, disc spaces look okay.  Brief History, Exam, Impression, and Recommendations:    Acute low back pain Earl [name] is a pleasant 54 year old male, he was helping a friend move a couple of weeks ago and started to have severe pain in the right side of his low back. He went to [facility], he was given some steroids, x-rays were overall unrevealing, these were done on 08/06/2020. We are going to treat him conservatively with physical therapy, Flexeril, prescription strength naproxen. Return to see me in approximately 3 to 4 weeks and we can consider MRI for epidural planning if no better.    ___________________________________________ Ihor Austin. Benjamin Stain, M.D., ABFM., CAQSM. Primary Care and Sports Medicine [facility]  Adjunct Instructor of Family Medicine  [employer]

## 2020-08-14 NOTE — Assessment & Plan Note (Signed)
Earl [name] is a pleasant 54 year old male, he was helping a friend move a couple of weeks ago and started to have severe pain in the right side of his low back. He went to [facility], he was given some steroids, x-rays were overall unrevealing, these were done on 08/06/2020. We are going to treat him conservatively with physical therapy, Flexeril, prescription strength naproxen. Return to see me in approximately 3 to 4 weeks and we can consider MRI for epidural planning if no better.

## 2020-08-15 DIAGNOSIS — L905 Scar conditions and fibrosis of skin: Secondary | ICD-10-CM | POA: Diagnosis not present

## 2020-08-15 DIAGNOSIS — C44519 Basal cell carcinoma of skin of other part of trunk: Secondary | ICD-10-CM | POA: Diagnosis not present

## 2020-08-20 DIAGNOSIS — G4700 Insomnia, unspecified: Secondary | ICD-10-CM | POA: Diagnosis not present

## 2020-08-20 DIAGNOSIS — M533 Sacrococcygeal disorders, not elsewhere classified: Secondary | ICD-10-CM | POA: Diagnosis not present

## 2020-08-20 DIAGNOSIS — F5221 Male erectile disorder: Secondary | ICD-10-CM | POA: Diagnosis not present

## 2020-08-20 DIAGNOSIS — G43909 Migraine, unspecified, not intractable, without status migrainosus: Secondary | ICD-10-CM | POA: Diagnosis not present

## 2020-09-17 ENCOUNTER — Other Ambulatory Visit: Payer: Self-pay | Admitting: Family Medicine

## 2020-09-17 ENCOUNTER — Encounter: Payer: Self-pay | Admitting: Family Medicine

## 2020-09-17 DIAGNOSIS — F5104 Psychophysiologic insomnia: Secondary | ICD-10-CM

## 2020-09-17 MED ORDER — ESZOPICLONE 3 MG PO TABS: 3.0000 mg | ORAL_TABLET | Freq: Every evening | ORAL | 1 refills | Status: AC | PRN

## 2020-10-17 ENCOUNTER — Encounter: Payer: Managed Care, Other (non HMO) | Admitting: Family Medicine

## 2020-10-23 ENCOUNTER — Other Ambulatory Visit: Payer: Self-pay | Admitting: Family Medicine

## 2020-10-23 DIAGNOSIS — R7989 Other specified abnormal findings of blood chemistry: Secondary | ICD-10-CM

## 2020-10-23 DIAGNOSIS — N401 Enlarged prostate with lower urinary tract symptoms: Secondary | ICD-10-CM

## 2020-10-23 DIAGNOSIS — Z13220 Encounter for screening for lipoid disorders: Secondary | ICD-10-CM

## 2020-10-23 DIAGNOSIS — Z0000 Encounter for general adult medical examination without abnormal findings: Secondary | ICD-10-CM

## 2020-10-23 DIAGNOSIS — I10 Essential (primary) hypertension: Secondary | ICD-10-CM

## 2020-10-23 NOTE — Progress Notes (Signed)
mp

## 2020-11-06 ENCOUNTER — Other Ambulatory Visit: Payer: Self-pay

## 2020-11-06 ENCOUNTER — Encounter: Payer: Self-pay | Admitting: Family Medicine

## 2020-11-06 ENCOUNTER — Ambulatory Visit (INDEPENDENT_AMBULATORY_CARE_PROVIDER_SITE_OTHER): Payer: Managed Care, Other (non HMO) | Admitting: Family Medicine

## 2020-11-06 DIAGNOSIS — Z0000 Encounter for general adult medical examination without abnormal findings: Secondary | ICD-10-CM | POA: Diagnosis not present

## 2020-11-06 NOTE — Assessment & Plan Note (Signed)
Well adult He will have labs completed today.   Recommend trying voltaren gel to elbows for pain. He can f/u with Dr. Benjamin Stain as needed.   Immunizations: Plans to get shingles vaccine once symptoms resolved.  Screening: UTD Anticipatory guidance/Risk factor reduction:  Recommendations per AVS.

## 2020-11-06 NOTE — Patient Instructions (Signed)

## 2020-11-06 NOTE — Progress Notes (Signed)
[name] - 55 y.o. male MRN [id number]  Date of birth: [birth year]  Subjective Chief Complaint  Patient presents with  . Annual Exam    HPI Earl [name] is a .55 y.o. male here today for annual exam.  He has history of low testosterone, chronic joint pain, migraines and insomnia.    He continues to have low back pain, he has been seeing Dr. Benjamin Stain for this.  Tried naproxen without much relief.  Still has some mild fatigue from shingles last month.      He does body building competitions and feels pretty good at current testosterone dose. He exercises daily.  He feels like his diet is quite good.   He is a non-smoker and denies EtOH use at this time.   He is up to date on colonoscopy.  He is due for updated PSA.   He sees dermatology for skin checks.   Review of Systems  Constitutional: Negative for chills, fever, malaise/fatigue and weight loss.  HENT: Negative for congestion, ear pain and sore throat.   Eyes: Negative for blurred vision, double vision and pain.  Respiratory: Negative for cough and shortness of breath.   Cardiovascular: Negative for chest pain and palpitations.  Gastrointestinal: Negative for abdominal pain, blood in stool, constipation, heartburn and nausea.  Genitourinary: Negative for dysuria and urgency.  Musculoskeletal: Positive for joint pain. Negative for myalgias.  Neurological: Negative for dizziness and headaches.  Endo/Heme/Allergies: Does not bruise/bleed easily.  Psychiatric/Behavioral: Negative for depression. The patient is not nervous/anxious and does not have insomnia.       Allergies  Allergen Reactions  . Iodinated Diagnostic Agents Swelling    N/V - rash- red skin   . Morphine And Related Other (See Comments)    Irritability   . Temazepam Other (See Comments)    Mood change, irritability  . Tomato Nausea And Vomiting and Other (See Comments)    Tomato based products give indigestion.     Past Medical History:   Diagnosis Date  . Abscess of nasal cavity 12/14/2018  . Acute urinary retention   . Arthritis   . Cellulitis of sidewall of nose 12/02/2018  . Depression   . Hemorrhoids   . History of skin cancer in adulthood 2012   chest wall  . Hypertension   . Hypogonadism, male   . Insomnia     Past Surgical History:  Procedure Laterality Date  . APPENDECTOMY    . COLONOSCOPY    . HERNIA REPAIR    . JOINT REPLACEMENT    . OSTEOTOMY    . SHOULDER ARTHROSCOPY Left 06/2018  . TOTAL HIP ARTHROPLASTY Right   . TOTAL KNEE ARTHROPLASTY Left     Social History   Socioeconomic History  . Marital status: Divorced    Spouse name: Not on file  . Number of children: Not on file  . Years of education: Not on file  . Highest education level: Not on file  Occupational History  . Not on file  Tobacco Use  . Smoking status: Never Smoker  . Smokeless tobacco: Never Used  Vaping Use  . Vaping Use: Never used  Substance and Sexual Activity  . Alcohol use: Not Currently  . Drug use: Never  . Sexual activity: Yes    Birth control/protection: None  Other Topics Concern  . Not on file  Social History Narrative  . Not on file   Social Determinants of Health   Financial Resource Strain: Not on file  Food Insecurity: Not on file  Transportation Needs: Not on file  Physical Activity: Not on file  Stress: Not on file  Social Connections: Not on file    Family History  Problem Relation Age of Onset  . Stroke Mother   . Skin cancer Father   . Heart disease Father   . Hypertension Father   . Aneurysm Brother   . Hypertension Sister   . Heart disease Sister   . Cancer Neg Hx   . Heart attack Neg Hx     Health Maintenance  Topic Date Due  . Hepatitis C Screening  Never done  . INFLUENZA VACCINE  03/25/2020  . TETANUS/TDAP  09/17/2026  . COLONOSCOPY (Pts 45-16yrs Insurance coverage will need to be confirmed)  07/16/2027  . COVID-19 Vaccine  Completed  . HIV Screening  Completed  .  HPV VACCINES  Aged Out     ----------------------------------------------------------------------------------------------------------------------------------------------------------------------------------------------------------------- Physical Exam BP (!) 124/55 (BP Location: Left Arm, Patient Position: Sitting, Cuff Size: Normal)   Pulse 64   Temp 97.8 F (36.6 C)   Wt 177 lb 1.6 oz (80.3 kg)   SpO2 100%   BMI 26.93 kg/m   Physical Exam Constitutional:      General: He is not in acute distress. HENT:     Head: Normocephalic and atraumatic.     Right Ear: Tympanic membrane and external ear normal.     Left Ear: Tympanic membrane and external ear normal.  Eyes:     General: No scleral icterus. Neck:     Thyroid: No thyromegaly.  Cardiovascular:     Rate and Rhythm: Normal rate and regular rhythm.     Heart sounds: Normal heart sounds.  Pulmonary:     Effort: Pulmonary effort is normal.     Breath sounds: Normal breath sounds.  Abdominal:     General: Bowel sounds are normal. There is no distension.     Palpations: Abdomen is soft.     Tenderness: There is no abdominal tenderness. There is no guarding.  Musculoskeletal:     Cervical back: Normal range of motion.  Lymphadenopathy:     Cervical: No cervical adenopathy.  Skin:    General: Skin is warm and dry.     Findings: No rash.  Neurological:     Mental Status: He is alert and oriented to person, place, and time.     Cranial Nerves: No cranial nerve deficit.     Motor: No abnormal muscle tone.  Psychiatric:        Mood and Affect: Mood normal.        Behavior: Behavior normal.     ------------------------------------------------------------------------------------------------------------------------------------------------------------------------------------------------------------------- Assessment and Plan  Well adult exam Well adult He will have labs completed today.   Recommend trying voltaren gel to  elbows for pain. He can f/u with Dr. Benjamin Stain as needed.   Immunizations: Plans to get shingles vaccine once symptoms resolved.  Screening: UTD Anticipatory guidance/Risk factor reduction:  Recommendations per AVS.    No orders of the defined types were placed in this encounter.   Return in about 6 months (around 05/09/2021) for Low testosterone.    This visit occurred during the SARS-CoV-2 public health emergency.  Safety protocols were in place, including screening questions prior to the visit, additional usage of staff PPE, and extensive cleaning of exam room while observing appropriate contact time as indicated for disinfecting solutions.

## 2020-11-07 LAB — CBC
HCT: 50.7 % — ABNORMAL HIGH (ref 38.5–50.0)
Hemoglobin: 16.9 g/dL (ref 13.2–17.1)
MCH: 30.3 pg (ref 27.0–33.0)
MCHC: 33.3 g/dL (ref 32.0–36.0)
MCV: 91 fL (ref 80.0–100.0)
MPV: 10.8 fL (ref 7.5–12.5)
Platelets: 177 10*3/uL (ref 140–400)
RBC: 5.57 10*6/uL (ref 4.20–5.80)
RDW: 13.1 % (ref 11.0–15.0)
WBC: 5.9 10*3/uL (ref 3.8–10.8)

## 2020-11-07 LAB — COMPLETE METABOLIC PANEL WITH GFR
AG Ratio: 2.2 (calc) (ref 1.0–2.5)
ALT: 40 U/L (ref 9–46)
AST: 31 U/L (ref 10–35)
Albumin: 4.3 g/dL (ref 3.6–5.1)
Alkaline phosphatase (APISO): 64 U/L (ref 35–144)
BUN: 16 mg/dL (ref 7–25)
CO2: 31 mmol/L (ref 20–32)
Calcium: 9.2 mg/dL (ref 8.6–10.3)
Chloride: 103 mmol/L (ref 98–110)
Creat: 0.99 mg/dL (ref 0.70–1.33)
GFR, Est African American: 99 mL/min/{1.73_m2} (ref >=60)
GFR, Est Non African American: 85 mL/min/{1.73_m2} (ref >=60)
Globulin: 2 g/dL (calc) (ref 1.9–3.7)
Glucose, Bld: 85 mg/dL (ref 65–99)
Potassium: 4.3 mmol/L (ref 3.5–5.3)
Sodium: 140 mmol/L (ref 135–146)
Total Bilirubin: 0.6 mg/dL (ref 0.2–1.2)
Total Protein: 6.3 g/dL (ref 6.1–8.1)

## 2020-11-07 LAB — LIPID PANEL
Cholesterol: 125 mg/dL (ref <200)
HDL: 41 mg/dL (ref >=40)
LDL Cholesterol (Calc): 66 mg/dL (calc)
Non-HDL Cholesterol (Calc): 84 mg/dL (calc) (ref <130)
Total CHOL/HDL Ratio: 3 (calc) (ref <5.0)
Triglycerides: 93 mg/dL (ref <150)

## 2020-11-07 LAB — TESTOSTERONE: Testosterone: 948 ng/dL — ABNORMAL HIGH (ref 250–827)

## 2020-11-07 LAB — PSA: PSA: 1.41 ng/mL (ref <=4.0)

## 2020-12-16 ENCOUNTER — Encounter: Payer: Self-pay | Admitting: Family Medicine
